# Patient Record
Sex: Female | Born: 1935
Health system: Southern US, Community
[De-identification: ages and names within clinical notes are randomized; demographics above are authoritative.]

## PROBLEM LIST (undated history)

## (undated) DIAGNOSIS — K900 Celiac disease: Secondary | ICD-10-CM

## (undated) DIAGNOSIS — M1990 Unspecified osteoarthritis, unspecified site: Secondary | ICD-10-CM

## (undated) DIAGNOSIS — I639 Cerebral infarction, unspecified: Secondary | ICD-10-CM

## (undated) DIAGNOSIS — M810 Age-related osteoporosis without current pathological fracture: Secondary | ICD-10-CM

## (undated) DIAGNOSIS — E785 Hyperlipidemia, unspecified: Secondary | ICD-10-CM

## (undated) DIAGNOSIS — M549 Dorsalgia, unspecified: Secondary | ICD-10-CM

## (undated) DIAGNOSIS — E7800 Pure hypercholesterolemia, unspecified: Secondary | ICD-10-CM

## (undated) DIAGNOSIS — F0390 Unspecified dementia without behavioral disturbance: Secondary | ICD-10-CM

## (undated) DIAGNOSIS — E669 Obesity, unspecified: Secondary | ICD-10-CM

## (undated) DIAGNOSIS — K922 Gastrointestinal hemorrhage, unspecified: Secondary | ICD-10-CM

## (undated) DIAGNOSIS — J449 Chronic obstructive pulmonary disease, unspecified: Secondary | ICD-10-CM

## (undated) HISTORY — PX: CHOLECYSTECTOMY: SHX55

## (undated) HISTORY — PX: BACK SURGERY: SHX140

---

## 1999-05-28 ENCOUNTER — Other Ambulatory Visit: Admission: RE | Admit: 1999-05-28 | Discharge: 1999-05-28 | Payer: Self-pay | Admitting: Obstetrics and Gynecology

## 1999-09-05 ENCOUNTER — Emergency Department (HOSPITAL_COMMUNITY): Admission: EM | Admit: 1999-09-05 | Discharge: 1999-09-05 | Payer: Self-pay | Admitting: Emergency Medicine

## 2000-07-07 ENCOUNTER — Other Ambulatory Visit: Admission: RE | Admit: 2000-07-07 | Discharge: 2000-07-07 | Payer: Self-pay | Admitting: Obstetrics and Gynecology

## 2003-11-11 ENCOUNTER — Inpatient Hospital Stay (HOSPITAL_COMMUNITY): Admission: RE | Admit: 2003-11-11 | Discharge: 2003-11-14 | Payer: Self-pay | Admitting: Neurosurgery

## 2003-12-18 ENCOUNTER — Encounter: Admission: RE | Admit: 2003-12-18 | Discharge: 2003-12-18 | Payer: Self-pay | Admitting: Neurosurgery

## 2004-03-04 ENCOUNTER — Encounter: Admission: RE | Admit: 2004-03-04 | Discharge: 2004-03-04 | Payer: Self-pay | Admitting: Neurosurgery

## 2005-06-04 ENCOUNTER — Encounter: Admission: RE | Admit: 2005-06-04 | Discharge: 2005-06-04 | Payer: Self-pay | Admitting: Neurosurgery

## 2006-12-21 ENCOUNTER — Other Ambulatory Visit: Admission: RE | Admit: 2006-12-21 | Discharge: 2006-12-21 | Payer: Self-pay | Admitting: Obstetrics and Gynecology

## 2007-08-17 ENCOUNTER — Ambulatory Visit: Payer: Self-pay | Admitting: Gastroenterology

## 2007-08-17 LAB — CONVERTED CEMR LAB
Basophils Absolute: 0.1 10*3/uL (ref 0.0–0.1)
Basophils Relative: 1.3 % — ABNORMAL HIGH (ref 0.0–1.0)
Eosinophils Absolute: 0.1 10*3/uL (ref 0.0–0.6)
Eosinophils Relative: 1.3 % (ref 0.0–5.0)
Ferritin: 9 ng/mL — ABNORMAL LOW (ref 10.0–291.0)
Folate: 15.7 ng/mL
HCT: 36.5 % (ref 36.0–46.0)
Hemoglobin: 12.2 g/dL (ref 12.0–15.0)
Iron: 53 ug/dL (ref 42–145)
Lymphocytes Relative: 22.3 % (ref 12.0–46.0)
MCHC: 33.4 g/dL (ref 30.0–36.0)
MCV: 91.6 fL (ref 78.0–100.0)
Monocytes Absolute: 0.8 10*3/uL — ABNORMAL HIGH (ref 0.2–0.7)
Monocytes Relative: 8.9 % (ref 3.0–11.0)
Neutro Abs: 6.1 10*3/uL (ref 1.4–7.7)
Neutrophils Relative %: 66.2 % (ref 43.0–77.0)
Platelets: 282 10*3/uL (ref 150–400)
RBC: 3.99 M/uL (ref 3.87–5.11)
RDW: 13.8 % (ref 11.5–14.6)
Saturation Ratios: 11.4 % — ABNORMAL LOW (ref 20.0–50.0)
Tissue Transglutaminase Ab, IgA: 91 units — ABNORMAL HIGH (ref <7)
Transferrin: 331.7 mg/dL (ref >=212.0)
Vitamin B-12: 297 pg/mL (ref 211–911)
WBC: 9.1 10*3/uL (ref 4.5–10.5)

## 2007-08-22 ENCOUNTER — Ambulatory Visit: Payer: Self-pay | Admitting: Gastroenterology

## 2007-09-06 ENCOUNTER — Encounter: Payer: Self-pay | Admitting: Gastroenterology

## 2007-09-06 ENCOUNTER — Ambulatory Visit: Payer: Self-pay | Admitting: Gastroenterology

## 2007-09-06 DIAGNOSIS — K2960 Other gastritis without bleeding: Secondary | ICD-10-CM | POA: Insufficient documentation

## 2007-09-06 DIAGNOSIS — K5730 Diverticulosis of large intestine without perforation or abscess without bleeding: Secondary | ICD-10-CM | POA: Insufficient documentation

## 2007-09-06 DIAGNOSIS — K559 Vascular disorder of intestine, unspecified: Secondary | ICD-10-CM | POA: Insufficient documentation

## 2007-09-26 ENCOUNTER — Encounter: Payer: Self-pay | Admitting: Gastroenterology

## 2007-09-26 ENCOUNTER — Encounter: Admission: RE | Admit: 2007-09-26 | Discharge: 2007-09-26 | Payer: Self-pay | Admitting: Gastroenterology

## 2007-10-06 ENCOUNTER — Ambulatory Visit: Payer: Self-pay | Admitting: Gastroenterology

## 2007-11-06 ENCOUNTER — Telehealth: Payer: Self-pay | Admitting: Gastroenterology

## 2007-11-06 ENCOUNTER — Ambulatory Visit: Payer: Self-pay | Admitting: Gastroenterology

## 2007-11-13 ENCOUNTER — Telehealth: Payer: Self-pay | Admitting: Gastroenterology

## 2007-11-14 LAB — CONVERTED CEMR LAB: Tissue Transglutaminase Ab, IgA: 55 units — ABNORMAL HIGH (ref <7)

## 2008-06-24 DIAGNOSIS — M545 Low back pain, unspecified: Secondary | ICD-10-CM | POA: Insufficient documentation

## 2008-06-24 DIAGNOSIS — M810 Age-related osteoporosis without current pathological fracture: Secondary | ICD-10-CM | POA: Insufficient documentation

## 2008-06-24 DIAGNOSIS — K644 Residual hemorrhoidal skin tags: Secondary | ICD-10-CM | POA: Insufficient documentation

## 2008-06-24 DIAGNOSIS — E7800 Pure hypercholesterolemia, unspecified: Secondary | ICD-10-CM | POA: Insufficient documentation

## 2008-06-25 ENCOUNTER — Ambulatory Visit: Payer: Self-pay | Admitting: Gastroenterology

## 2008-06-25 DIAGNOSIS — K219 Gastro-esophageal reflux disease without esophagitis: Secondary | ICD-10-CM | POA: Insufficient documentation

## 2008-06-25 DIAGNOSIS — K900 Celiac disease: Secondary | ICD-10-CM | POA: Insufficient documentation

## 2008-06-27 ENCOUNTER — Telehealth: Payer: Self-pay | Admitting: Gastroenterology

## 2008-06-27 LAB — CONVERTED CEMR LAB
Folate: >20 ng/mL
Iron: 102 ug/dL (ref 42–145)
Rheumatoid fact SerPl-aCnc: 63.2 intl units/mL — ABNORMAL HIGH (ref 0.0–20.0)
Saturation Ratios: 26.3 % (ref 20.0–50.0)
Transferrin: 276.9 mg/dL (ref >=212.0)
Vitamin B-12: 355 pg/mL (ref 211–911)

## 2008-06-28 ENCOUNTER — Telehealth: Payer: Self-pay | Admitting: Gastroenterology

## 2008-07-02 ENCOUNTER — Encounter: Payer: Self-pay | Admitting: Gastroenterology

## 2008-07-19 ENCOUNTER — Encounter: Payer: Self-pay | Admitting: Gastroenterology

## 2008-07-22 ENCOUNTER — Telehealth: Payer: Self-pay | Admitting: Gastroenterology

## 2008-10-02 ENCOUNTER — Encounter: Payer: Self-pay | Admitting: Gastroenterology

## 2008-10-30 ENCOUNTER — Encounter: Admission: RE | Admit: 2008-10-30 | Discharge: 2008-10-30 | Payer: Self-pay | Admitting: Family Medicine

## 2010-04-08 ENCOUNTER — Emergency Department (HOSPITAL_BASED_OUTPATIENT_CLINIC_OR_DEPARTMENT_OTHER): Admission: EM | Admit: 2010-04-08 | Discharge: 2010-04-09 | Payer: Self-pay | Admitting: Emergency Medicine

## 2010-04-08 ENCOUNTER — Ambulatory Visit: Payer: Self-pay | Admitting: Interventional Radiology

## 2010-07-02 NOTE — Miscellaneous (Signed)
Summary: Orders Update  Clinical Lists Changes  Orders: Added new Referral order of Rheumatology Referral (Rheumatology) - Signed 

## 2010-07-02 NOTE — Progress Notes (Signed)
Summary: LAB RESULTS  Medications Added LEVSIN 0.125 MG  TABS (HYOSCYAMINE SULFATE) one by mouth 4-6 hours as needed       Phone Note Call from Patient Call back at [phone]   Call For: DR PATTERSON Reason for Call: Lab or Test Results Summary of Call: REALLY WANTS TO KNOW LAB RESULTS FOR CELIAC. Initial call taken by: Leanor Kail [facility],  November 13, 2007 10:28 AM  Follow-up for Phone Call        pt has alot of questions about Celiac and says that she has a "twing of pain" not sure what from and says she has changed her diet and wants to know if the "twing" is from the Celiac. And if there is something she can take for it. And do her labs change anything? Follow-up by: Harlow Mares CMA,  November 13, 2007 10:58 AM  Additional Follow-up for Phone Call Additional follow up Details #1::        Avoid NSAID's and use aciphex and use as needed levsin. Additional Follow-up by: Mardella Layman MD [facility],  November 14, 2007 9:02 AM    Additional Follow-up for Phone Call Additional follow up Details #2::    pt aware and levsin sent. Follow-up by: Harlow Mares CMA,  November 14, 2007 9:13 AM  New/Updated Medications: LEVSIN 0.125 MG  TABS (HYOSCYAMINE SULFATE) one by mouth 4-6 hours as needed   Prescriptions: LEVSIN 0.125 MG  TABS (HYOSCYAMINE SULFATE) one by mouth 4-6 hours as needed  #180 x 3   Entered by:   Harlow Mares CMA   Authorized by:   Mardella Layman MD FACG,FAGA   Signed by:   Harlow Mares CMA on 11/14/2007   Method used:   Electronically sent to ...       [facility] [address]       [address]       [place], [place]  [address]       Ph: [phone]       Fax: [phone]   RxID:   [id number]

## 2010-07-02 NOTE — Letter (Signed)
Summary: Rheumatology-Dr. Kellie Simmering  Rheumatology-Dr. Kellie Simmering   Imported By: Maryln Gottron 10/18/2008 15:37:20  _____________________________________________________________________  External Attachment:    Type:   Image     Comment:   External Document

## 2010-07-02 NOTE — Progress Notes (Signed)
Summary: Follow up on labs   Phone Note Outgoing Call   Call placed by: Harlow Mares CMA,  June 27, 2008 10:05 AM Summary of Call: patient appt with Dr Kellie Simmering for arthritis. Left message on patients machine to call back.  Initial call taken by: Harlow Mares CMA,  June 27, 2008 10:06 AM  Follow-up for Phone Call        patient has alot of appt going on and is aware her labs are abnormal i gave her all of dr truslows information and she will contact him for an appt and let me know where to send her records to. Follow-up by: Harlow Mares CMA,  June 27, 2008 11:41 AM

## 2010-07-02 NOTE — Assessment & Plan Note (Signed)
Summary: F-UP CELIAC DIS/ULCERS/FH  Medications Added CALCIUM 600 1500 MG TABS (CALCIUM CARBONATE) two times a day HYDROCODONE-ACETAMINOPHEN 5-500 MG TABS (HYDROCODONE-ACETAMINOPHEN) as needed FENTANYL 75 MCG/HR PT72 (FENTANYL) as needed FISH OIL 1200 MG CAPS (OMEGA-3 FATTY ACIDS) once daily        History of Present Illness Visit Type: follow up Primary GI MD: Melvia Heaps MD [facility] Primary Provider: Windle Guard, MD Chief Complaint: celiac disease History of Present Illness:   Carrie Smith is a 75 year old Caucasian female with chronic arthritis of her back and hands that was previously treated with NSAIDs that caused severe colitis and GI bleeding and had to be discontinued. She currently is on hydrocodone twice a day and is managed by Dr. Jeannetta Nap. She also has chronic GERD managed with AcipHex 20 mg a day.  She has celiac disease this is remission on dietary therapy and duodenal biopsy year ago she had no active because of disease. She is due for bone density scanning. She is on multivitamins and vitamin D replacement. Recent colonoscopy was otherwise unremarkable except for diverticulosis, external hemorrhoids, and changes of chronic ischemic colitis felt secondary to NSAIDs. Her bowels are moving well at this time despite codeine and she denies melena or hematochezia.Her appetite is good and her weight is stable. Prior serum B12 level was borderline low and we will repeat.   GI Review of Systems      Denies abdominal pain, acid reflux, belching, bloating, chest pain, dysphagia with liquids, dysphagia with solids, heartburn, loss of appetite, nausea, vomiting, vomiting blood, weight loss, and  weight gain.        Denies anal fissure, black tarry stools, change in bowel habit, constipation, diarrhea, diverticulosis, fecal incontinence, heme positive stool, hemorrhoids, irritable bowel syndrome, jaundice, light color stool, liver problems, rectal bleeding, and  rectal pain.     Prior  Medications Reviewed Using: List Brought by Patient  Updated Prior Medication List: LEVSIN 0.125 MG  TABS (HYOSCYAMINE SULFATE) one by mouth 4-6 hours as needed VITAMIN D 30160 UNIT CAPS (ERGOCALCIFEROL) 1 tablet by mouth q week PRAVASTATIN SODIUM 40 MG TABS (PRAVASTATIN SODIUM) 1 tablet by mouth at bedtime AMITRIPTYLINE HCL 10 MG TABS (AMITRIPTYLINE HCL) 1 tablet by mouth at bedtime DIAZEPAM 5 MG TABS (DIAZEPAM) 1 tablet by mouth at bedtime ACIPHEX 20 MG TBEC (RABEPRAZOLE SODIUM) 1 tablet by mouth once daily CALCIUM 600 1500 MG TABS (CALCIUM CARBONATE) two times a day HYDROCODONE-ACETAMINOPHEN 5-500 MG TABS (HYDROCODONE-ACETAMINOPHEN) as needed FENTANYL 75 MCG/HR PT72 (FENTANYL) as needed FISH OIL 1200 MG CAPS (OMEGA-3 FATTY ACIDS) once daily  Current Allergies (reviewed today): ! CODEINE  Past Medical History:    Reviewed history from 06/24/2008 and no changes required:       Current Problems:        OSTEOPOROSIS (ICD-733.00)       HYPERCHOLESTEROLEMIA (ICD-272.0)       LOW BACK PAIN, CHRONIC (ICD-724.2)       OTHER SPEC GASTRITIS WITHOUT MENTION HEMORRHAGE (ICD-535.40)       EXTERNAL HEMORRHOIDS (ICD-455.3)       DIVERTICULOSIS, COLON (ICD-562.10)       ISCHEMIC COLITIS (ICD-557.9)         Past Surgical History:    Reviewed history from 06/24/2008 and no changes required:       Cholecystectomy       Tubal Ligation   Family History:    Reviewed history and no changes required:       No FH of Colon Cancer:  Family History of Heart Disease: Both parents & siblings       Family History of Diabetes: Mother, Sister  Social History:    Reviewed history and no changes required:       Occupation: Retired       Patient is a former smoker.        Alcohol Use - no       Daily Caffeine Use -1       Illicit Drug Use - no       Patient does not get regular exercise.    Risk Factors:  Tobacco use:  quit    Year quit:  2000 Drug use:  no Caffeine use:  1 drinks per  day Alcohol use:  no Exercise:  no Seatbelt use:  100 %    Vital Signs:  Patient Profile:   75 Years Old Female Height:     59 inches Weight:      182.50 pounds BMI:     36.99 Pulse rate:   72 / minute Pulse rhythm:   regular BP sitting:   114 / 70  (left arm)  Vitals Entered By: June McMurray CMA (June 25, 2008 11:02 AM)                  Physical Exam  General:     Well developed, well nourished, no acute distress.healthy appearing.   Head:     Normocephalic and atraumatic. Eyes:     PERRLA, no icterus.exam deferred to patient's ophthalmologist.   Psych:     Alert and cooperative. Normal mood and affect.    Impression & Recommendations:  Problem # 1:  CELIAC DISEASE (ICD-579.0) Assessment: Improved Continue gluten-free diet and vitamin supplementation. Orders: TLB-IBC Pnl (Iron/FE;Transferrin) (83550-IBC) TLB-B12, Serum-Total ONLY (60454-U98) TLB-Folic Acid (Folate) (82746-FOL)   Problem # 2:  OSTEOPOROSIS (ICD-733.00) Assessment: Improved Continue Meds per Dr. Jeannetta Nap and I will order a rheumatoid factor with her anemia profile. Orders: TLB-Rheumatoid Factor (RA) (11914-NW)   Problem # 3:  ISCHEMIC COLITIS (ICD-557.9) Assessment: Improved this was felt to be related to NSAID use and has resolved with discontinuation of these meds  Problem # 4:  EXTERNAL HEMORRHOIDS (ICD-455.3) Assessment: Improved  Problem # 5:  GERD (ICD-530.81) Assessment: Improved continue reflux being a daily PPI therapy.   Patient Instructions: 1)  Copy Sent To:Dr. Windle Guard 2)  Please Continue current medications. 3)  Continue Celiac Diet 4)  Anemia profile and rheumatoid factor ordered

## 2010-07-02 NOTE — Miscellaneous (Signed)
Summary: RX DARVOCET  Clinical Lists Changes  Medications: Added new medication of DARVOCET-N 100 100-650 MG  TABS (PROPOXYPHENE N-APAP) every six hours as needed - Signed Rx of DARVOCET-N 100 100-650 MG  TABS (PROPOXYPHENE N-APAP) every six hours as needed;  #120 x 0;  Signed;  Entered by: Harlow Mares CMA;  Authorized by: Mardella Layman MD;  Method used: Print then Give to Patient    Prescriptions: DARVOCET-N 100 100-650 MG  TABS (PROPOXYPHENE N-APAP) every six hours as needed  #120 x 0   Entered by:   Harlow Mares CMA   Authorized by:   Mardella Layman MD   Signed by:   Harlow Mares CMA on 10/06/2007   Method used:   Print then Give to Patient   RxID:   [id number]

## 2010-07-02 NOTE — Procedures (Signed)
Summary: Colonoscopy   Colonoscopy  Procedure date:  09/06/2007  Findings:      Results: Hemorrhoids.     Results: Diverticulosis.       Results: Colitis.       Location:  [facility].   Patient Name: Carrie Smith, Carrie Smith MRN:  Procedure Procedures: Panendoscopy (EGD) CPT: 43235.    with biopsy(s)/brushing(s). CPT: D1846139.  Personnel: Endoscopist: Vania Rea. Jarold Motto, MD.  Exam Location: Exam performed in Outpatient Clinic. Outpatient  Patient Consent: Procedure, Alternatives, Risks and Benefits discussed, consent obtained, from patient. Consent was obtained by the RN.  Indications  Evaluation of: Positive fecal occult blood test  Comments: ++ celiac serologies.Marland KitchenMarland KitchenR/O SPRUE.... History  Current Medications: Patient is taking a non-steroidal medication. Patient is not currently taking Coumadin.  Medical/Surgical History: Hyperparathyroidism, Osteoarthritis, Back pain, Narcotic use. Osteoporesis, TIAs, Celiac disease, ++ serology.  Pre-Exam Physical: Performed Sep 06, 2007  Entire physical exam was normal.  Comments: Pt. history reviewed/updated, physical exam performed prior to initiation of sedation? yes Exam Exam Info: Maximum depth of insertion Duodenum, intended Duodenum. Patient position: on left side. Duration of exam: 10 minutes. Vocal cords visualized. Gastric retroflexion performed. Images taken. ASA Classification: II. Tolerance: excellent.  Sedation Meds: Patient assessed and found to be appropriate for moderate (conscious) sedation. Sedation was managed by the Endoscopist. Fentanyl 25 mcg. given IV. Versed 2 mg. given IV.  Monitoring: BP and pulse monitoring done. Oximetry used. Supplemental O2 given at 2 Liters.  Instrument(s): GIF 160. Serial [id number].   Findings - Normal: Proximal Esophagus to Distal Esophagus. Not Seen: Tumor. Barrett's esophagus. Mucosal abnormality. Foreign body. Stricture. Varices.  - MUCOSAL ABNORMALITY:  Cardia to Antrum. Erosions present. Nodularity present. Erythematous mucosa. Red spots present. RUT done, results pending. ICD9: Gastritis, NSAID Induced: 535.40. Comment: Large amount of dried HEME noted...  - Normal: Duodenal Bulb to Duodenal 2nd Portion. Not Seen: Ulcer. Mucosal abnormality. Biopsy/Normal taken. Comments: SI biopsy done... ..   Assessment  Diagnoses: 535.40: Gastritis, NSAID Induced.   Comments: Small bowel biopsy done to confirm celiac disease Dx.... Events  Unplanned Intervention: No unplanned interventions were required.  Plans Instructions: No aspirin or non-steroidal containing medications: STOP..STOP..STOP.  Medication(s): Await pathology. Referring provider to order medications. PPI: Aciphex 20 mg QAM, starting Sep 06, 2007 for 4 wks.   Patient Education: Patient given standard instructions for: Mucosal Abnormality.  Comments: STOP ALL NSAIDS... Disposition: After procedure patient sent to recovery. After recovery patient sent home.  Scheduling: Clinic Visit, to Vania Rea. Jarold Motto, MD, around Oct 06, 2007.    This report was created from the original endoscopy report, which was reviewed and signed by the above listed endoscopist.    cc.Wilson O. Jeannetta Nap, MD    Shawn Route    Stefanie Libel, MD

## 2010-07-02 NOTE — Letter (Signed)
[facility]: [facility]   Imported By: Maryln Gottron 10/12/2007 14:58:51  _____________________________________________________________________  External Attachment:    Type:   Image     Comment:   External Document

## 2010-07-02 NOTE — Progress Notes (Signed)
Summary: Lab results   Phone Note Call from Patient Call back at Home Phone [phone] Call back at cell [phone]   Call For: DR Jarold Motto Reason for Call: Talk to Nurse Summary of Call: Talked to you yesterday about her lab results but she didnt write it down and forgot. Initial call taken by: Leanor Kail [facility],  June 28, 2008 11:12 AM  Follow-up for Phone Call        gave the patient the lab values again today. Follow-up by: Harlow Mares CMA,  June 28, 2008 11:40 AM

## 2010-07-02 NOTE — Progress Notes (Signed)
Summary: ?LAB WORK & WANTS SAMPLES   Phone Note Call from Patient Call back at [phone]   Caller: Patient Call For: PATTERSON Reason for Call: Talk to Nurse Summary of Call: PATTERSON PT.  PT TOLD TO COME IN FOR BLOOD WORK ANYTIME THIS WEEK, ARE ORDERS IN? ALSO WANTS SAMPLES OF ACIPHEX.  PATIENT'S CHART HAS BEEN REQUESTED Initial call taken by: Magdalen Spatz [facility],  November 06, 2007 9:07 AM  Follow-up for Phone Call        sprue panel in IDX, samples of aciphex up front. pt aware. Follow-up by: Harlow Mares CMA,  November 06, 2007 10:28 AM

## 2010-07-02 NOTE — Letter (Signed)
Summary: Knees/Dr. Aundra Dubin M.D.  Knees/Dr. Aundra Dubin M.D.   Imported By: Sherian Rein 08/07/2008 14:52:21  _____________________________________________________________________  External Attachment:    Type:   Image     Comment:   External Document

## 2010-07-02 NOTE — Procedures (Signed)
Summary: EGD   EGD  Procedure date:  09/06/2007  Findings:      Findings: Gastritis  Location: [facility]   Patient Name: Carrie Smith, Carrie Smith MRN:  Procedure Procedures: Panendoscopy (EGD) CPT: 43235.    with biopsy(s)/brushing(s). CPT: D1846139.  Personnel: Endoscopist: Vania Rea. Jarold Motto, MD.  Exam Location: Exam performed in Outpatient Clinic. Outpatient  Patient Consent: Procedure, Alternatives, Risks and Benefits discussed, consent obtained, from patient. Consent was obtained by the RN.  Indications  Evaluation of: Positive fecal occult blood test  Comments: ++ celiac serologies.Marland KitchenMarland KitchenR/O SPRUE.... History  Current Medications: Patient is taking a non-steroidal medication. Patient is not currently taking Coumadin.  Medical/Surgical History: Hyperparathyroidism, Osteoarthritis, Back pain, Narcotic use. Osteoporesis, TIAs, Celiac disease, ++ serology.  Pre-Exam Physical: Performed Sep 06, 2007  Entire physical exam was normal.  Comments: Pt. history reviewed/updated, physical exam performed prior to initiation of sedation? yes Exam Exam Info: Maximum depth of insertion Duodenum, intended Duodenum. Patient position: on left side. Duration of exam: 10 minutes. Vocal cords visualized. Gastric retroflexion performed. Images taken. ASA Classification: II. Tolerance: excellent.  Sedation Meds: Patient assessed and found to be appropriate for moderate (conscious) sedation. Sedation was managed by the Endoscopist. Fentanyl 25 mcg. given IV. Versed 2 mg. given IV.  Monitoring: BP and pulse monitoring done. Oximetry used. Supplemental O2 given at 2 Liters.  Instrument(s): GIF 160. Serial [id number].   Findings - Normal: Proximal Esophagus to Distal Esophagus. Not Seen: Tumor. Barrett's esophagus. Mucosal abnormality. Foreign body. Stricture. Varices.  - MUCOSAL ABNORMALITY: Cardia to Antrum. Erosions present. Nodularity present. Erythematous mucosa. Red  spots present. RUT done, results pending. ICD9: Gastritis, NSAID Induced: 535.40. Comment: Large amount of dried HEME noted...  - Normal: Duodenal Bulb to Duodenal 2nd Portion. Not Seen: Ulcer. Mucosal abnormality. Biopsy/Normal taken. Comments: SI biopsy done... ..   Assessment  Diagnoses: 535.40: Gastritis, NSAID Induced.   Comments: Small bowel biopsy done to confirm celiac disease Dx.... Events  Unplanned Intervention: No unplanned interventions were required.  Plans Instructions: No aspirin or non-steroidal containing medications: STOP..STOP..STOP.  Medication(s): Await pathology. Referring provider to order medications. PPI: Aciphex 20 mg QAM, starting Sep 06, 2007 for 4 wks.   Patient Education: Patient given standard instructions for: Mucosal Abnormality.  Comments: STOP ALL NSAIDS... Disposition: After procedure patient sent to recovery. After recovery patient sent home.  Scheduling: Clinic Visit, to Vania Rea. Jarold Motto, MD, around Oct 06, 2007.    This report was created from the original endoscopy report, which was reviewed and signed by the above listed endoscopist.

## 2010-08-26 ENCOUNTER — Other Ambulatory Visit: Payer: Self-pay | Admitting: Family Medicine

## 2010-08-26 ENCOUNTER — Ambulatory Visit
Admission: RE | Admit: 2010-08-26 | Discharge: 2010-08-26 | Disposition: A | Discharge status: Home / Self Care | Payer: Medicare Other | Source: Ambulatory Visit | Attending: Family Medicine | Admitting: Family Medicine

## 2010-08-26 DIAGNOSIS — R52 Pain, unspecified: Secondary | ICD-10-CM

## 2010-08-26 DIAGNOSIS — R609 Edema, unspecified: Secondary | ICD-10-CM

## 2010-10-13 NOTE — Assessment & Plan Note (Signed)
[facility]                         GASTROENTEROLOGY OFFICE NOTE   Carrie Smith, Carrie Smith                       MRN:          [id number]  DATE:10/06/2007                            DOB:          1935-07-26    [name] had endoscopy and colonoscopy and was found to have severe  NSAID damage to her liver and colon.  She had markedly positive  serologies for celiac disease but had a negative small bowel biopsy.  I  still think she has celiac disease and we referred her to the dietician  for gluten free diet.  She will come back in 2 months' time and have her  serologies repeated.   In the interim I have asked her to avoid NSAIDs and to use p.r.n.  Darvocet 100 for severe arthritis.  We renewed her Aciphex 20 mg a day  and I have asked her to continue other medications per Dr. Jeannetta Nap.     Vania Rea. Jarold Motto, MD, Caleen Essex, FAGA  Electronically Signed    DRP/MedQ  DD: 10/06/2007  DT: 10/06/2007  Job #: [id number]   cc:   Windle Guard, M.D.

## 2010-10-13 NOTE — Assessment & Plan Note (Signed)
[facility]                         GASTROENTEROLOGY OFFICE NOTE   JAMONICA, SCHOFF                       MRN:          [id number]  DATE:08/17/2007                            DOB:          10-Oct-1935    HISTORY:  Carrie Smith is a 75 year old white female who had positive  Hemoccult cards at the time of a recent check up with Dr. Windle Guard.  I did colonoscopy on her some 10 years ago and removed a hyperplastic  polyp.   Dekayla really denies any GI complaints except for some intermittent  dyspepsia.  She is on NSAID's and aspirin for chronic low back pain and  multiple back surgeries.  She has been under the care of Dr. Julio Sicks  and apparently currently is taking p.r.n. hydrocodone and Fentanyl patch  every 3 days along with p.r.n. Valium for muscle relaxation.  She denies  anemia or any history of coagulopathy.  Her bowels move well.  She has  really had no melena or hematochezia but apparently she did have a  positive Hemoccult card on recent testing.  Otherwise, she denies  gastrointestinal problems except for some mild constipation related to  her Codeine use. She has not had any barium studies or endoscopic exams  of her gut since 1998.  At that time she did have some diverticulosis.  She does have a past history of NSAID-induced peptic ulcer disease.  She  does take ranitidine at bedtime currently with her aspirin and NSAID's.   PAST MEDICAL HISTORY:  Her past medical history otherwise is remarkable  for hypercholesterolemia, mild CVA in 1987, degenerative arthritis and  rather severe spondylosis of her back requiring multiple surgical  procedures.  She also has had previous bilateral tubal ligation and a  cholecystectomy some 30 years ago.   MEDICATIONS:  1. Arthrotec 75 mg twice a day.  2. Vitamin D weekly.  3. Calcium with vitamin D twice a day.  4. Pravastatin 40 mg daily.  5. Ranitidine 300 mg daily.  6. Aspirin 81 mg daily.  7. Triamterene/HCTZ daily.  8. Amitriptyline 10 mg at bedtime.  9. P.r.n. Valium 5 mg.  10.50 mcg Fentanyl patch every 3 to 4 days.  11.P.r.n. hydrocodone/APAP use.   ALLERGIES:  She denies true drug allergies.   FAMILY HISTORY:  Noncontributory except for atherosclerotic  cardiovascular disease in multiple members.   SOCIAL HISTORY:  She is divorced and lives alone.  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  She does not smoke currently  and quit 5 to 6 years ago.  She denies alcohol abuse.   REVIEW OF SYSTEMS:  Remarkable for arthritis in her large joints and  back.  She has some mild chronic urine incontinency.  She denies current  cardiovascular, pulmonary or other neurological or psychiatric problems.  Her review of systems is otherwise noncontributory.   PHYSICAL EXAMINATION:  GENERAL:  She is a healthy, but elderly-appearing  white female appearing her stated age in no acute distress.  VITAL SIGNS:  She is only 4 feet tall and weighs 181  pounds.  Blood  pressure 116/68, pulse was 84 and regular.  HEENT:  I could not appreciate stigmata of chronic liver disease.  CHEST:  Her chest was generally clear.  CARDIOVASCULAR:  She appeared to be in a regular rhythm without murmurs,  gallops or rubs.  ABDOMEN:  Showed no organomegaly, masses or tenderness.  Bowel sounds  were normal.  BACK:  Examination of the back was unremarkable with lower back vertical  scar.  There is no evidence of induration or increased warmth or  localized tenderness.  EXTREMITIES:  Peripheral extremities were unremarkable.  NEUROLOGICAL:  Mental status was clear.   ASSESSMENT:  1. Guaiac positive stool, possibly related to heavy nonsteroidal anti-      inflammatory and aspirin use and a previous history of peptic ulcer      disease - rule out Helicobacter Pylori infection.  2. History of asymptomatic diverticulosis.  3. Colonoscopy needed to exclude colonic polyposis.  4.  Chronic low back pain, as mentioned above, on multiple medications.  5. Previous history of mild transient ischemic attack some 20 years      ago.  6. History of hypercholesterolemia.  7. History of osteoporosis.  8. Current narcotic dependency with p.r.n. Valium use.   RECOMMENDATIONS:  1. Check CBC, anemia profile and Celiac panel.  2. Outpatient endoscopy and colonoscopy while holding aspirin for one      week.  3. At the time of her endoscopy will check for H. Pylori and do small      bowel biopsy.  4. Continue other multiple medications per Dr. Jeannetta Nap.     Vania Rea. Jarold Motto, MD, Caleen Essex, FAGA  Electronically Signed    DRP/MedQ  DD: 08/17/2007  DT: 08/17/2007  Job #: [id number]   cc:   Windle Guard, M.D.  Henry A. Pool, M.D.  Cynthia P. Romine, M.D.

## 2010-10-16 NOTE — Op Note (Signed)
NAME:  Carrie Smith, Carrie Smith                          ACCOUNT NO.:  [id number]   MEDICAL RECORD NO.:  [id number]                   PATIENT TYPE:  INP   LOCATION:  2899                                 FACILITY:  [facility]   PHYSICIAN:  Sherilyn Cooter A. Pool, M.D.                 DATE OF BIRTH:  1936-04-23   DATE OF PROCEDURE:  11/11/2003  DATE OF DISCHARGE:                                 OPERATIVE REPORT   PREOPERATIVE DIAGNOSES:  1. L4-5 degenerative grade I spondylolisthesis with severe stenosis.  2. Right L5-S1 herniated nucleus pulposus with radiculopathy.   PROCEDURE:  1. L4-5 decompression laminectomy, with foraminotomies.  2. L4-5 posterior lumbar body fusion, utilizing tangent wedges and local     autografting.  3. L4-5 posterolateral fusion using pedicle screw fixation and local     autografting.  4. Right L5-S1 laminotomy and microdiskectomy.   SURGEON:  Kathaleen Maser. Pool, M.D.   ASSISTANT:  Donalee Citrin, M.D.   ANESTHESIA:  General endotracheal.   INDICATIONS:  Ms. Ancona is a 75 year old female with a history of severe  right lower extremity pain, consistent with right-sided S1 radiculopathy.  She also has a history of long-standing back pain and bilateral lower  extremity symptoms, consistent with neurogenic claudication.  Workup  demonstrated severe stenosis at L4-5, secondary to degenerative  spondylolisthesis.  She has coincidental right-sided L5-S1 disk herniation,  with marked compression of the thecal sac and right-sided S1 nerve root.  We  discussed the options over management.  I think she has been chronically  symptomatic from her stenosis, but here acutely is decompensated secondary  to this ruptured disk at L5-S1.  We have discussed undergoing surgery at  both levels.  The patient is aware of the risks and benefits and wishes to  proceed.   OPERATIVE NOTE:  The patient went to the operating room and placed on the  table in a supine position.  After an adequate level of  anesthesia was  achieved, the patient was moved prone onto a Wilson frame and appropriately  padded in all bony regions.  Prepped and draped sterilely.  A #10 blade was  used to make a longitudinal skin incision overlying the L4-5 and S1 levels.  This was carried down sharply in the midline.  Subperiosteal dissection was  performed bilaterally, exposing the lamina and facet joints of L4-L5 and S1.  Deep self-retaining retractor was placed.  Intraoperative fluoroscopy was  used and the levels were confirmed.  Transverse processes at L5 and L4 were  also dissected free bilaterally.  Complete decompression and laminectomy was  then performed at L4-5 using Leksell rongeurs, Kerrison rongeurs and the  high-speed drill to remove the entire lamina at L4; the entire inferior  facet joint complex at L4; and the superior facet of L5.  The superior  aspect of the lamina at L5 was also resected.  Ligament of Flavum was  elevated and dissected in piecemeal fashion using Kerrison rongeurs.  Underlying thecal sac and exiting L4 and L5 nerve roots were identified.  Wide foraminotomies were performed along the course of the exiting nerve  roots.   Attention was placed to the L5-S1 on the right.  Laminotomy was then  performed using the high-speed drill and Kerrison rongeurs to remove the  inferior aspect of the lamina at L5 and the medial aspect of the L5-S1 facet  joints, and superior rim of the S1 lamina.  The ligament of Flavum was then  elevated and resected in piecemeal fashion, using Kerrison rongeurs.  Underlying thecal sac and exiting S1 nerve root were identified.  Microdissection of the right-sided S1 nerve root and underlying disk  herniation.  Thecal sac and S1 nerve root were mobilized towards the  midline.  A large free fragment disk herniation was encountered.  This was  dissected free using blunt nerve hooks and pituitary rongeurs.  All elements  of the disk herniation were completely  resected.   At this point a very thorough fragmentectomy had been performed.  There was  no evidence of any significant tearing of the annulus itself.  The disk  itself was quite flat.  It was decided not to do an annulotomy at this  level.   Attention was then placed to the L4-5.  Starting first at the patient's  side, the thecal sac and nerve roots were protected.  Disk space was then  incised with #15 blade.  Wide disk space cleaned of tissue using pituitary  rongeurs, upward angled-tooth rongeurs and Epstein curettes.  All loose  __________ were removed the interspace.  The procedure was then repeated on  the contralateral side.  The disk space was then sequentially dilated up to  10 mm with a 10 mm distraction and patient flipped to the right side.  Thecal sac and nerve root was protected on the left.  Disk space was then  weaned and then cut with 10 mm tangent instruments.  Soft tissue was removed  from the interspace.  The 10 x 26  mm tangent wedge was then impacted into  place and recessed approximately 2 mm from posterior cortical margin.  The  distractor was removed to the patient's right side.  The thecal sac and  nerve roots retracted on the patient's right side.  Once again the disk  space was reamed and then cut with 10 mm tangent instruments.  Soft tissue  was removed.  Disk space was further curettaged.  Morselized autograft was  packed into the interspace.  A second 10 x 26 mm tangent wedge was then  impacted into place and recessed approximately 2 mm from posterior cortical  margin.   Pedicles of L4 and L5 were then identified using surface landmarks and  intraoperative fluoroscopy.  Superficial bone overlying the pedicles was  removed using high-speed drill.  Each pedicle was then probed using pedicle  awl.  Each pedicle awl tract was then probed and found to be solid within  bone.  Each pedicle awl tract was then tapped with the 5.25 mm screw tap. Each screw tap  hole was probed and found to be solid within bone.  A 6.75 x  45 mm spiral 90-D screws were placed bilaterally at L4.  Then 6.75 x 40 mm  screw was placed bilaterally at L5.  Short segment titanium rods were  contoured to fit over the rods.   Transverse processes at L4 and L5 were  then decorticated using the high-  speed drill.  Morselized autograft was packed posterolaterally for later  fusion -- this was done bilaterally.  The rods were placed over the screw  heads.  The locking caps were then  engaged, with the construct under  compression.  Final examination revealed good position of bone grafts and  hardware at appropriate level with normal alignment of the spine.   The wound was then irrigated with antibiotic solution.  It was then closed  in typical fashion.  Steri-Strips and sterile dressing were applied.  There  were no perioperative complications.  The patient tolerated this very well,  and she returns to the recovery room postoperatively.                                               Henry A. Pool, M.D.    HAP/MEDQ  D:  11/11/2003  T:  11/11/2003  Job:  [id number]

## 2010-12-30 ENCOUNTER — Ambulatory Visit (INDEPENDENT_AMBULATORY_CARE_PROVIDER_SITE_OTHER): Payer: Medicare Other

## 2010-12-30 ENCOUNTER — Inpatient Hospital Stay (INDEPENDENT_AMBULATORY_CARE_PROVIDER_SITE_OTHER)
Admission: RE | Admit: 2010-12-30 | Discharge: 2010-12-30 | Disposition: A | Discharge status: Home / Self Care | Payer: Medicare Other | Source: Ambulatory Visit | Attending: Family Medicine | Admitting: Family Medicine

## 2010-12-30 DIAGNOSIS — M109 Gout, unspecified: Secondary | ICD-10-CM

## 2012-03-07 ENCOUNTER — Other Ambulatory Visit: Payer: Self-pay | Admitting: Neurosurgery

## 2012-03-07 DIAGNOSIS — M48061 Spinal stenosis, lumbar region without neurogenic claudication: Secondary | ICD-10-CM

## 2012-03-13 ENCOUNTER — Ambulatory Visit
Admission: RE | Admit: 2012-03-13 | Discharge: 2012-03-13 | Disposition: A | Discharge status: Home / Self Care | Payer: Medicare Other | Source: Ambulatory Visit | Attending: Neurosurgery | Admitting: Neurosurgery

## 2012-03-13 DIAGNOSIS — M48061 Spinal stenosis, lumbar region without neurogenic claudication: Secondary | ICD-10-CM

## 2012-03-13 MED ORDER — GADOBENATE DIMEGLUMINE 529 MG/ML IV SOLN: 8.0000 mL | Freq: Once | INTRAVENOUS | Status: AC | PRN

## 2014-08-07 DIAGNOSIS — E119 Type 2 diabetes mellitus without complications: Secondary | ICD-10-CM | POA: Diagnosis not present

## 2014-08-07 DIAGNOSIS — Z0000 Encounter for general adult medical examination without abnormal findings: Secondary | ICD-10-CM | POA: Diagnosis not present

## 2014-08-09 DIAGNOSIS — E559 Vitamin D deficiency, unspecified: Secondary | ICD-10-CM | POA: Diagnosis not present

## 2014-08-09 DIAGNOSIS — N189 Chronic kidney disease, unspecified: Secondary | ICD-10-CM | POA: Diagnosis not present

## 2014-08-09 DIAGNOSIS — E785 Hyperlipidemia, unspecified: Secondary | ICD-10-CM | POA: Diagnosis not present

## 2014-08-09 DIAGNOSIS — E119 Type 2 diabetes mellitus without complications: Secondary | ICD-10-CM | POA: Diagnosis not present

## 2015-02-12 DIAGNOSIS — E119 Type 2 diabetes mellitus without complications: Secondary | ICD-10-CM | POA: Diagnosis not present

## 2015-02-12 DIAGNOSIS — Z23 Encounter for immunization: Secondary | ICD-10-CM | POA: Diagnosis not present

## 2015-02-12 DIAGNOSIS — G8929 Other chronic pain: Secondary | ICD-10-CM | POA: Diagnosis not present

## 2015-04-13 ENCOUNTER — Inpatient Hospital Stay (HOSPITAL_COMMUNITY)
Admission: EM | Admit: 2015-04-13 | Discharge: 2015-04-18 | DRG: 175 | Disposition: A | Discharge status: Skilled Nursing Facility | Payer: Medicare Other | Attending: Internal Medicine | Admitting: Internal Medicine

## 2015-04-13 ENCOUNTER — Encounter (HOSPITAL_COMMUNITY): Payer: Self-pay | Admitting: Emergency Medicine

## 2015-04-13 ENCOUNTER — Emergency Department (HOSPITAL_COMMUNITY): Payer: Medicare Other

## 2015-04-13 ENCOUNTER — Other Ambulatory Visit: Payer: Self-pay

## 2015-04-13 DIAGNOSIS — Z9049 Acquired absence of other specified parts of digestive tract: Secondary | ICD-10-CM

## 2015-04-13 DIAGNOSIS — R0902 Hypoxemia: Secondary | ICD-10-CM | POA: Diagnosis present

## 2015-04-13 DIAGNOSIS — M4316 Spondylolisthesis, lumbar region: Secondary | ICD-10-CM | POA: Diagnosis present

## 2015-04-13 DIAGNOSIS — J9811 Atelectasis: Secondary | ICD-10-CM | POA: Diagnosis present

## 2015-04-13 DIAGNOSIS — R7401 Elevation of levels of liver transaminase levels: Secondary | ICD-10-CM | POA: Diagnosis present

## 2015-04-13 DIAGNOSIS — E785 Hyperlipidemia, unspecified: Secondary | ICD-10-CM | POA: Diagnosis present

## 2015-04-13 DIAGNOSIS — E669 Obesity, unspecified: Secondary | ICD-10-CM | POA: Diagnosis present

## 2015-04-13 DIAGNOSIS — R42 Dizziness and giddiness: Secondary | ICD-10-CM | POA: Diagnosis not present

## 2015-04-13 DIAGNOSIS — I2699 Other pulmonary embolism without acute cor pulmonale: Principal | ICD-10-CM | POA: Diagnosis present

## 2015-04-13 DIAGNOSIS — Z981 Arthrodesis status: Secondary | ICD-10-CM

## 2015-04-13 DIAGNOSIS — M6282 Rhabdomyolysis: Secondary | ICD-10-CM | POA: Diagnosis present

## 2015-04-13 DIAGNOSIS — Z8673 Personal history of transient ischemic attack (TIA), and cerebral infarction without residual deficits: Secondary | ICD-10-CM | POA: Diagnosis not present

## 2015-04-13 DIAGNOSIS — E872 Acidosis: Secondary | ICD-10-CM | POA: Diagnosis not present

## 2015-04-13 DIAGNOSIS — E876 Hypokalemia: Secondary | ICD-10-CM | POA: Diagnosis present

## 2015-04-13 DIAGNOSIS — I2609 Other pulmonary embolism with acute cor pulmonale: Secondary | ICD-10-CM | POA: Diagnosis not present

## 2015-04-13 DIAGNOSIS — R740 Nonspecific elevation of levels of transaminase and lactic acid dehydrogenase [LDH]: Secondary | ICD-10-CM | POA: Diagnosis not present

## 2015-04-13 DIAGNOSIS — I272 Other secondary pulmonary hypertension: Secondary | ICD-10-CM | POA: Diagnosis present

## 2015-04-13 DIAGNOSIS — W19XXXA Unspecified fall, initial encounter: Secondary | ICD-10-CM | POA: Diagnosis present

## 2015-04-13 DIAGNOSIS — F0390 Unspecified dementia without behavioral disturbance: Secondary | ICD-10-CM | POA: Diagnosis present

## 2015-04-13 DIAGNOSIS — R7989 Other specified abnormal findings of blood chemistry: Secondary | ICD-10-CM

## 2015-04-13 DIAGNOSIS — N179 Acute kidney failure, unspecified: Secondary | ICD-10-CM | POA: Diagnosis not present

## 2015-04-13 DIAGNOSIS — I82403 Acute embolism and thrombosis of unspecified deep veins of lower extremity, bilateral: Secondary | ICD-10-CM

## 2015-04-13 DIAGNOSIS — R278 Other lack of coordination: Secondary | ICD-10-CM | POA: Diagnosis not present

## 2015-04-13 DIAGNOSIS — Z833 Family history of diabetes mellitus: Secondary | ICD-10-CM

## 2015-04-13 DIAGNOSIS — R6883 Chills (without fever): Secondary | ICD-10-CM

## 2015-04-13 DIAGNOSIS — R748 Abnormal levels of other serum enzymes: Secondary | ICD-10-CM | POA: Diagnosis present

## 2015-04-13 DIAGNOSIS — S79912A Unspecified injury of left hip, initial encounter: Secondary | ICD-10-CM | POA: Diagnosis not present

## 2015-04-13 DIAGNOSIS — E874 Mixed disorder of acid-base balance: Secondary | ICD-10-CM | POA: Diagnosis present

## 2015-04-13 DIAGNOSIS — R404 Transient alteration of awareness: Secondary | ICD-10-CM | POA: Diagnosis not present

## 2015-04-13 DIAGNOSIS — Z7982 Long term (current) use of aspirin: Secondary | ICD-10-CM | POA: Diagnosis not present

## 2015-04-13 DIAGNOSIS — I130 Hypertensive heart and chronic kidney disease with heart failure and stage 1 through stage 4 chronic kidney disease, or unspecified chronic kidney disease: Secondary | ICD-10-CM | POA: Diagnosis present

## 2015-04-13 DIAGNOSIS — M810 Age-related osteoporosis without current pathological fracture: Secondary | ICD-10-CM | POA: Diagnosis present

## 2015-04-13 DIAGNOSIS — E875 Hyperkalemia: Secondary | ICD-10-CM | POA: Diagnosis present

## 2015-04-13 DIAGNOSIS — Z87891 Personal history of nicotine dependence: Secondary | ICD-10-CM

## 2015-04-13 DIAGNOSIS — M1990 Unspecified osteoarthritis, unspecified site: Secondary | ICD-10-CM | POA: Diagnosis not present

## 2015-04-13 DIAGNOSIS — M549 Dorsalgia, unspecified: Secondary | ICD-10-CM | POA: Diagnosis not present

## 2015-04-13 DIAGNOSIS — G9341 Metabolic encephalopathy: Secondary | ICD-10-CM | POA: Diagnosis present

## 2015-04-13 DIAGNOSIS — K900 Celiac disease: Secondary | ICD-10-CM | POA: Diagnosis present

## 2015-04-13 DIAGNOSIS — M109 Gout, unspecified: Secondary | ICD-10-CM | POA: Diagnosis present

## 2015-04-13 DIAGNOSIS — L8990 Pressure ulcer of unspecified site, unspecified stage: Secondary | ICD-10-CM | POA: Diagnosis present

## 2015-04-13 DIAGNOSIS — N183 Chronic kidney disease, stage 3 (moderate): Secondary | ICD-10-CM | POA: Diagnosis present

## 2015-04-13 DIAGNOSIS — Z8249 Family history of ischemic heart disease and other diseases of the circulatory system: Secondary | ICD-10-CM | POA: Diagnosis not present

## 2015-04-13 DIAGNOSIS — I5032 Chronic diastolic (congestive) heart failure: Secondary | ICD-10-CM | POA: Diagnosis present

## 2015-04-13 DIAGNOSIS — M170 Bilateral primary osteoarthritis of knee: Secondary | ICD-10-CM | POA: Diagnosis present

## 2015-04-13 DIAGNOSIS — R410 Disorientation, unspecified: Secondary | ICD-10-CM | POA: Diagnosis not present

## 2015-04-13 DIAGNOSIS — R220 Localized swelling, mass and lump, head: Secondary | ICD-10-CM | POA: Diagnosis not present

## 2015-04-13 DIAGNOSIS — S299XXA Unspecified injury of thorax, initial encounter: Secondary | ICD-10-CM | POA: Diagnosis not present

## 2015-04-13 DIAGNOSIS — R778 Other specified abnormalities of plasma proteins: Secondary | ICD-10-CM | POA: Diagnosis present

## 2015-04-13 DIAGNOSIS — M6281 Muscle weakness (generalized): Secondary | ICD-10-CM | POA: Diagnosis not present

## 2015-04-13 HISTORY — DX: Pure hypercholesterolemia, unspecified: E78.00

## 2015-04-13 HISTORY — DX: Dorsalgia, unspecified: M54.9

## 2015-04-13 HISTORY — DX: Age-related osteoporosis without current pathological fracture: M81.0

## 2015-04-13 HISTORY — DX: Unspecified osteoarthritis, unspecified site: M19.90

## 2015-04-13 HISTORY — DX: Unspecified dementia, unspecified severity, without behavioral disturbance, psychotic disturbance, mood disturbance, and anxiety: F03.90

## 2015-04-13 HISTORY — DX: Obesity, unspecified: E66.9

## 2015-04-13 HISTORY — DX: Cerebral infarction, unspecified: I63.9

## 2015-04-13 HISTORY — DX: Gastrointestinal hemorrhage, unspecified: K92.2

## 2015-04-13 HISTORY — DX: Celiac disease: K90.0

## 2015-04-13 HISTORY — DX: Hyperlipidemia, unspecified: E78.5

## 2015-04-13 LAB — CBC WITH DIFFERENTIAL/PLATELET
Basophils Absolute: 0 10*3/uL (ref 0.0–0.1)
Basophils Relative: 0 %
Eosinophils Absolute: 0 10*3/uL (ref 0.0–0.7)
Eosinophils Relative: 0 %
HCT: 46.1 % — ABNORMAL HIGH (ref 36.0–46.0)
Hemoglobin: 15.9 g/dL — ABNORMAL HIGH (ref 12.0–15.0)
Lymphocytes Relative: 4 %
Lymphs Abs: 0.7 10*3/uL (ref 0.7–4.0)
MCH: 32.1 pg (ref 26.0–34.0)
MCHC: 34.5 g/dL (ref 30.0–36.0)
MCV: 93.1 fL (ref 78.0–100.0)
Monocytes Absolute: 1.8 10*3/uL — ABNORMAL HIGH (ref 0.1–1.0)
Monocytes Relative: 10 %
Neutro Abs: 15.5 10*3/uL — ABNORMAL HIGH (ref 1.7–7.7)
Neutrophils Relative %: 86 %
Platelets: 235 10*3/uL (ref 150–400)
RBC: 4.95 MIL/uL (ref 3.87–5.11)
RDW: 14.6 % (ref 11.5–15.5)
WBC: 18 10*3/uL — ABNORMAL HIGH (ref 4.0–10.5)

## 2015-04-13 LAB — COMPREHENSIVE METABOLIC PANEL
ALT: 50 U/L (ref 14–54)
AST: 120 U/L — ABNORMAL HIGH (ref 15–41)
Albumin: 3.3 g/dL — ABNORMAL LOW (ref 3.5–5.0)
Alkaline Phosphatase: 103 U/L (ref 38–126)
Anion gap: 11 (ref 5–15)
BUN: 41 mg/dL — ABNORMAL HIGH (ref 6–20)
CO2: 25 mmol/L (ref 22–32)
Calcium: 9.1 mg/dL (ref 8.9–10.3)
Chloride: 102 mmol/L (ref 101–111)
Creatinine, Ser: 1.28 mg/dL — ABNORMAL HIGH (ref 0.44–1.00)
GFR calc Af Amer: 46 mL/min — ABNORMAL LOW (ref >60)
GFR calc non Af Amer: 39 mL/min — ABNORMAL LOW (ref >60)
Glucose, Bld: 157 mg/dL — ABNORMAL HIGH (ref 65–99)
Potassium: 5.9 mmol/L — ABNORMAL HIGH (ref 3.5–5.1)
Sodium: 138 mmol/L (ref 135–145)
Total Bilirubin: 2.3 mg/dL — ABNORMAL HIGH (ref 0.3–1.2)
Total Protein: 7.1 g/dL (ref 6.5–8.1)

## 2015-04-13 LAB — CK: Total CK: 1210 U/L — ABNORMAL HIGH (ref 38–234)

## 2015-04-13 LAB — BLOOD GAS, ARTERIAL
Acid-Base Excess: 0.1 mmol/L (ref 0.0–2.0)
Bicarbonate: 23 mEq/L (ref 20.0–24.0)
Drawn by: 307971
O2 Content: 5 L/min
O2 Saturation: 89.8 %
Patient temperature: 98.6
TCO2: 19.7 mmol/L (ref 0–100)
pCO2 arterial: 33.7 mmHg — ABNORMAL LOW (ref 35.0–45.0)
pH, Arterial: 7.449 (ref 7.350–7.450)
pO2, Arterial: 58.7 mmHg — ABNORMAL LOW (ref 80.0–100.0)

## 2015-04-13 LAB — I-STAT TROPONIN, ED: Troponin i, poc: 0.09 ng/mL (ref 0.00–0.08)

## 2015-04-13 LAB — URINALYSIS, ROUTINE W REFLEX MICROSCOPIC
Bilirubin Urine: NEGATIVE
Glucose, UA: 100 mg/dL — AB
Ketones, ur: NEGATIVE mg/dL
Leukocytes, UA: NEGATIVE
Nitrite: NEGATIVE
Protein, ur: >300 mg/dL — AB
Specific Gravity, Urine: 1.036 — ABNORMAL HIGH (ref 1.005–1.030)
Urobilinogen, UA: 0.2 mg/dL (ref 0.0–1.0)
pH: 6 (ref 5.0–8.0)

## 2015-04-13 LAB — BRAIN NATRIURETIC PEPTIDE: B Natriuretic Peptide: 171.7 pg/mL — ABNORMAL HIGH (ref 0.0–100.0)

## 2015-04-13 LAB — CBG MONITORING, ED: Glucose-Capillary: 163 mg/dL — ABNORMAL HIGH (ref 65–99)

## 2015-04-13 LAB — URINE MICROSCOPIC-ADD ON

## 2015-04-13 LAB — I-STAT CG4 LACTIC ACID, ED: Lactic Acid, Venous: 3.08 mmol/L (ref 0.5–2.0)

## 2015-04-13 LAB — PROTIME-INR
INR: 0.99 (ref 0.00–1.49)
Prothrombin Time: 13.3 seconds (ref 11.6–15.2)

## 2015-04-13 MED ORDER — MORPHINE SULFATE (PF) 2 MG/ML IV SOLN
2.0000 mg | INTRAVENOUS | Status: DC | PRN
Start: 2015-04-13 — End: 2015-04-17

## 2015-04-13 MED ORDER — SODIUM CHLORIDE 0.9 % IV SOLN: INTRAVENOUS | Status: DC

## 2015-04-13 MED ORDER — ENOXAPARIN SODIUM 100 MG/ML ~~LOC~~ SOLN
85.0000 mg | Freq: Two times a day (BID) | SUBCUTANEOUS | Status: DC
  Administered 2015-04-13 – 2015-04-14 (×3): 85 mg via SUBCUTANEOUS
  Filled 2015-04-13 (×5): qty 1

## 2015-04-13 MED ORDER — METOPROLOL TARTRATE 1 MG/ML IV SOLN
5.0000 mg | Freq: Once | INTRAVENOUS | Status: DC
  Filled 2015-04-13: qty 5

## 2015-04-13 MED ORDER — ONDANSETRON HCL 4 MG PO TABS: 4.0000 mg | ORAL_TABLET | Freq: Four times a day (QID) | ORAL | Status: DC | PRN

## 2015-04-13 MED ORDER — IOHEXOL 350 MG/ML SOLN
100.0000 mL | Freq: Once | INTRAVENOUS | Status: AC | PRN
  Administered 2015-04-13: 80 mL via INTRAVENOUS

## 2015-04-13 MED ORDER — ONDANSETRON HCL 4 MG/2ML IJ SOLN: 4.0000 mg | Freq: Four times a day (QID) | INTRAMUSCULAR | Status: DC | PRN

## 2015-04-13 MED ORDER — SODIUM CHLORIDE 0.9 % IJ SOLN
3.0000 mL | Freq: Two times a day (BID) | INTRAMUSCULAR | Status: DC
  Administered 2015-04-14 – 2015-04-18 (×9): 3 mL via INTRAVENOUS

## 2015-04-13 MED ORDER — SODIUM CHLORIDE 0.9 % IV BOLUS (SEPSIS)
1000.0000 mL | Freq: Once | INTRAVENOUS | Status: AC
  Administered 2015-04-13: 1000 mL via INTRAVENOUS

## 2015-04-13 MED ORDER — DEXTROSE 5 % IV SOLN
1.0000 g | Freq: Once | INTRAVENOUS | Status: AC
  Administered 2015-04-13: 1 g via INTRAVENOUS
  Filled 2015-04-13: qty 10

## 2015-04-13 MED ORDER — ACETAMINOPHEN 650 MG RE SUPP: 650.0000 mg | Freq: Four times a day (QID) | RECTAL | Status: DC | PRN

## 2015-04-13 MED ORDER — ACETAMINOPHEN 325 MG PO TABS
650.0000 mg | ORAL_TABLET | Freq: Four times a day (QID) | ORAL | Status: DC | PRN
  Administered 2015-04-16: 650 mg via ORAL
  Filled 2015-04-13: qty 2

## 2015-04-13 MED ORDER — SODIUM CHLORIDE 0.9 % IV BOLUS (SEPSIS): 1000.0000 mL | Freq: Once | INTRAVENOUS | Status: DC

## 2015-04-13 MED ORDER — SENNOSIDES-DOCUSATE SODIUM 8.6-50 MG PO TABS
1.0000 | ORAL_TABLET | Freq: Every evening | ORAL | Status: DC | PRN
Start: 2015-04-13 — End: 2015-04-18

## 2015-04-13 MED ORDER — DEXTROSE 5 % IV SOLN
500.0000 mg | Freq: Once | INTRAVENOUS | Status: AC
  Administered 2015-04-13: 500 mg via INTRAVENOUS
  Filled 2015-04-13: qty 500

## 2015-04-13 NOTE — ED Notes (Signed)
RN unable to get weight on Pt while in the ED.  Pt unsure and family also unsure.  No recent medical visit in chart with recent weight.  Pt will be weighed once she reaches the floor.

## 2015-04-13 NOTE — ED Notes (Signed)
Regenia Skeeter, EDP made aware of patient i-stat troponin result.

## 2015-04-13 NOTE — ED Notes (Signed)
Bed: [id number] Expected date: 04/13/15 Expected time: 3:38 PM Means of arrival: Ambulance Comments: Fall

## 2015-04-13 NOTE — ED Notes (Signed)
Pt is from home.  Son was on scene, thinks she fell around 1 am.  Pt was found on floor incontinent of urine and stool.  Pt not c/o pain but does state that she feels a little lightheaded.  Son states that Pt is at baseline.  She is in early stage of dementia.  CBG 208.

## 2015-04-13 NOTE — ED Notes (Signed)
Carrie Smith, EDP made aware of patient CG4 lactic result.

## 2015-04-13 NOTE — ED Notes (Signed)
MD at bedside. 

## 2015-04-13 NOTE — ED Notes (Signed)
While drawing the pts blood the pt fell asleep and woke up startled by her visitors, jerking her hand away causing a bruise to her left hand.

## 2015-04-13 NOTE — ED Notes (Signed)
Bairhugger initiated.

## 2015-04-13 NOTE — ED Notes (Signed)
Pt has had significant IV issues.  Pt will move arm which causes alarm on IV pumps.  RN has been to room at least 6 times per hour to reposition Pt.  RN has used arm board for IV but Pt still manages to move arm causing alarm.  This has caused slower administration of fluids and meds.  Pt is extremely hard IV stick with 3 ultrasound IV attempts to get one good IV placement.

## 2015-04-13 NOTE — ED Notes (Signed)
Patient transported to CT 

## 2015-04-13 NOTE — ED Provider Notes (Signed)
CSN: [id number]     Arrival date & time 04/13/15  1545 History   First MD Initiated Contact with Patient 04/13/15 1551     Chief Complaint  Patient presents with  . Fall     (Consider location/radiation/quality/duration/timing/severity/associated sxs/prior Treatment) HPI  79 year old female presents from home after a fall. According to EMS the son tells them that the patient has early stages of dementia and felt lightheaded today. Unwitnessed fall. Patient was found on the floor incontinent of urine and stool. Patient denies any complaints at this time. Denies headache, chest pain, or shortness of breath. Patient states she did not fall, but lowered herself to the ground.   No past medical history on file. No past surgical history on file. No family history on file. Social History  Substance Use Topics  . Smoking status: Not on file  . Smokeless tobacco: Not on file  . Alcohol Use: Not on file   OB History    No data available     Review of Systems  Unable to perform ROS: Dementia      Allergies  Codeine  Home Medications   Prior to Admission medications   Medication Sig Start Date End Date Taking? Authorizing Provider  allopurinol (ZYLOPRIM) 300 MG tablet TAKE 1 TABLET EVERY DAY TO PREVENT GOUT 03/01/15   Historical Provider, MD  atorvastatin (LIPITOR) 80 MG tablet Take 80 mg by mouth daily. 03/06/15   Historical Provider, MD  furosemide (LASIX) 40 MG tablet Take 40 mg by mouth daily as needed for fluid.  02/03/15   Historical Provider, MD  traMADol (ULTRAM) 50 MG tablet Take 1-2 tablets by mouth every 4 (four) hours as needed. Max 5 per day 02/12/15   Historical Provider, MD   There were no vitals taken for this visit. Physical Exam  Constitutional: She is oriented to person, place, and time. She appears well-developed and well-nourished.  HENT:  Head: Normocephalic and atraumatic.  Right Ear: External ear normal.  Left Ear: External ear normal.  Nose: Nose normal.   Eyes: EOM are normal. Pupils are equal, round, and reactive to light. Right eye exhibits no discharge. Left eye exhibits no discharge.  Neck: Neck supple.  Cardiovascular: Normal rate, regular rhythm and normal heart sounds.   No murmur heard. Pulmonary/Chest: Effort normal.  Mild rales RLB  Abdominal: Soft. There is no tenderness.  Musculoskeletal:  Initial pain with ROM of left knee/hip, but when repeated has no pain. No tenderness to either hip, knee or focal bony tenderness  Neurological: She is alert and oriented to person, place, and time.  Alert, oriented to place and time. Seems confused as to why she is in ER. Cn 2-12 grossly intact. 5/5 strength in upper extremities. Patient cannot lift lower extremities off bed but can wiggle toes  Skin: Skin is warm and dry.  Nursing note and vitals reviewed.   ED Course  Procedures (including critical care time) Labs Review Labs Reviewed  COMPREHENSIVE METABOLIC PANEL - Abnormal; Notable for the following:    Potassium 5.9 (*)    Glucose, Bld 157 (*)    BUN 41 (*)    Creatinine, Ser 1.28 (*)    Albumin 3.3 (*)    AST 120 (*)    Total Bilirubin 2.3 (*)    GFR calc non Af Amer 39 (*)    GFR calc Af Amer 46 (*)    All other components within normal limits  CBC WITH DIFFERENTIAL/PLATELET - Abnormal; Notable for the following:  WBC 18.0 (*)    Hemoglobin 15.9 (*)    HCT 46.1 (*)    Neutro Abs 15.5 (*)    Monocytes Absolute 1.8 (*)    All other components within normal limits  URINALYSIS, ROUTINE W REFLEX MICROSCOPIC (NOT AT [facility]) - Abnormal; Notable for the following:    Color, Urine AMBER (*)    Specific Gravity, Urine 1.036 (*)    Glucose, UA 100 (*)    Hgb urine dipstick LARGE (*)    Protein, ur >300 (*)    All other components within normal limits  CK - Abnormal; Notable for the following:    Total CK 1210 (*)    All other components within normal limits  BRAIN NATRIURETIC PEPTIDE - Abnormal; Notable for the following:     B Natriuretic Peptide 171.7 (*)    All other components within normal limits  BLOOD GAS, ARTERIAL - Abnormal; Notable for the following:    pCO2 arterial 33.7 (*)    pO2, Arterial 58.7 (*)    All other components within normal limits  URINE MICROSCOPIC-ADD ON - Abnormal; Notable for the following:    Casts HYALINE CASTS (*)    All other components within normal limits  I-STAT TROPOININ, ED - Abnormal; Notable for the following:    Troponin i, poc 0.09 (*)    All other components within normal limits  CBG MONITORING, ED - Abnormal; Notable for the following:    Glucose-Capillary 163 (*)    All other components within normal limits  I-STAT CG4 LACTIC ACID, ED - Abnormal; Notable for the following:    Lactic Acid, Venous 3.08 (*)    All other components within normal limits  URINE CULTURE  CULTURE, BLOOD (ROUTINE X 2)  CULTURE, BLOOD (ROUTINE X 2)  MRSA PCR SCREENING  PROTIME-INR  COMPREHENSIVE METABOLIC PANEL  CBC  TROPONIN I  TROPONIN I  TROPONIN I  POTASSIUM  CK    Imaging Review Dg Chest 1 View  04/13/2015  CLINICAL DATA:  Found on floor. Concern for chest injury. Initial encounter. EXAM: CHEST 1 VIEW COMPARISON:  None. FINDINGS: There is elevation of the right hemidiaphragm. There is no evidence of focal opacification, pleural effusion or pneumothorax. The cardiomediastinal silhouette is borderline normal in size. No acute osseous abnormalities are seen. There is chronic elevation of the right humeral head, reflecting chronic rotator cuff tear. IMPRESSION: Elevation of the right hemidiaphragm. Lungs remain grossly clear. No displaced rib fracture seen. Electronically Signed   By: Garald Balding M.D.   On: 04/13/2015 18:28   Ct Head Wo Contrast  04/13/2015  CLINICAL DATA:  Fall. Found on floor incontinent of urine and stool. Lightheaded. Initial encounter. EXAM: CT HEAD WITHOUT CONTRAST TECHNIQUE: Contiguous axial images were obtained from the base of the skull through the  vertex without intravenous contrast. COMPARISON:  Brain MRI 10/30/2008 FINDINGS: Multiple small infarcts are present in the right cerebellum, generally chronic in appearance although increased in number from the prior MRI. There may be a new small infarct inferiorly in the left cerebellum. There is moderate enlargement of the ventricles which is slightly more prominent than on the prior study. The cerebral sulci are normal in size. A small, chronic left occipital cortical infarct is again seen. Periventricular and subcortical cerebral white matter hypodensities are nonspecific but compatible with moderate chronic small vessel ischemic disease. There is no evidence of acute large territory infarct, intracranial hemorrhage, mass, midline shift, or extra-axial fluid collection. Right frontotemporal scalp swelling is  noted. The orbits are unremarkable. Mild debris is noted in the left sphenoid sinus. The mastoid air cells are clear. No skull fracture is identified. Calcified atherosclerosis is noted at the skull base. IMPRESSION: 1. Right frontotemporal scalp swelling. No skull fracture or intracranial hemorrhage identified. 2. Multiple small infarcts in the cerebellum, increased in number from 2010. These are overall chronic in appearance, however superimposed acute or subacute ischemia is not excluded given the interval change. 3. Chronic left occipital infarct and chronic small vessel ischemic disease in the cerebral white matter. 4. Moderate ventriculomegaly, slightly increased from the prior MRI. This may reflect central predominant cerebral atrophy, however normal pressure hydrocephalus is not excluded in the appropriate clinical setting. Electronically Signed   By: Logan Bores M.D.   On: 04/13/2015 18:17   Ct Angio Chest Pe W/cm &/or Wo Cm  04/13/2015  CLINICAL DATA:  Patient found on floor unresponsive. Desaturation. Initial encounter. EXAM: CT ANGIOGRAPHY CHEST WITH CONTRAST TECHNIQUE: Multidetector CT  imaging of the chest was performed using the standard protocol during bolus administration of intravenous contrast. Multiplanar CT image reconstructions and MIPs were obtained to evaluate the vascular anatomy. CONTRAST:  28mL OMNIPAQUE IOHEXOL 350 MG/ML SOLN COMPARISON:  Chest radiograph performed earlier today at 6:12 p.m. FINDINGS: Segmental pulmonary emboli are noted within pulmonary arteries to the right lower lobe and left upper lobe, and smaller emboli within the pulmonary arteries to the right upper lobe and left lower lobe. The RV/LV ratio of 0.96 corresponds to concern for submassive pulmonary embolus. Mild patchy right-sided atelectasis is noted. The lungs are otherwise grossly clear. There is no evidence of pleural effusion or pneumothorax. No masses are identified; no abnormal focal contrast enhancement is seen. Scattered mediastinal nodes remain normal in size. No pericardial effusion is identified. Scattered calcification is noted along the thoracic aorta. The great vessels are grossly unremarkable. No axillary lymphadenopathy is seen. The thyroid gland is unremarkable in appearance. The visualized portions of the liver and spleen are unremarkable. No acute osseous abnormalities are seen. Review of the MIP images confirms the above findings. IMPRESSION: 1. Segmental pulmonary emboli within pulmonary arteries to the right lower lobe and left upper lobe, and smaller emboli within the pulmonary arteries to the right upper lobe and left lower lobe. CT evidence of right heart strain (RV/LV Ratio = 0.96) consistent with at least submassive (intermediate risk) PE. The presence of right heart strain has been associated with an increased risk of morbidity and mortality. Please activate Code PE by paging [phone]. 2. Mild patchy right-sided atelectasis noted. Lungs otherwise clear. Critical Value/emergent results were called by telephone at the time of interpretation on 04/13/2015 at 8:31 pm to Dr. Sherwood Gambler, who verbally acknowledged these results. Electronically Signed   By: Garald Balding M.D.   On: 04/13/2015 20:31   Dg Hip Unilat With Pelvis 2-3 Views Left  04/13/2015  CLINICAL DATA:  Status post fall. Concern for left hip injury. Initial encounter. EXAM: DG HIP (WITH OR WITHOUT PELVIS) 2-3V LEFT COMPARISON:  None. FINDINGS: There is no evidence of fracture or dislocation. Both femoral heads are seated normally within their respective acetabula. The proximal left femur appears intact. Mild sclerotic change is noted at the left sacroiliac joint. The patient is status post lumbar spinal fusion at L4-L5. The visualized bowel gas pattern is grossly unremarkable in appearance. IMPRESSION: No evidence of fracture or dislocation. Electronically Signed   By: Garald Balding M.D.   On: 04/13/2015 18:29   I have  personally reviewed and evaluated these images and lab results as part of my medical decision-making.   EKG Interpretation None      ED ECG REPORT   Date: 04/13/2015  Rate: 113  Rhythm: Atrial fibrillation vs sinus with artifact  QRS Axis: normal  Intervals: unable to determine  ST/T Wave abnormalities: nonspecific T wave changes  Conduction Disutrbances:none  Narrative Interpretation: Artifact significantly limits interpretation of ECG  Old EKG Reviewed: changes noted  I have personally reviewed the EKG tracing and agree with the computerized printout as noted.   CRITICAL CARE Performed by: Sherwood Gambler T   Total critical care time: 45 minutes  Critical care time was exclusive of separately billable procedures and treating other patients.  Critical care was necessary to treat or prevent imminent or life-threatening deterioration.  Critical care was time spent personally by me on the following activities: development of treatment plan with patient and/or surrogate as well as nursing, discussions with consultants, evaluation of patient's response to treatment,  examination of patient, obtaining history from patient or surrogate, ordering and performing treatments and interventions, ordering and review of laboratory studies, ordering and review of radiographic studies, pulse oximetry and re-evaluation of patient's condition.  MDM   Final diagnoses:  Chills  Pulmonary Embolism Hypoxia Lactic acidosis  Patient presents mildly confused and newly hypoxic to 80% on room air. Initially treated as sepsis with IV Rocephin and azithromycin for potentially pneumonia as well as what seemed like an initial UTI. However x-rays clear and her urine shows blood without RBCs. She does have a mildly elevated CK which would indicate early rhabdomyolysis given she was on the floor for several hours without any help. She was given multiple boluses of IV fluids for this as well as the lactic acidosis. Given her hypoxia, CT scan was obtained and shows multiple segmental pulmonary emboli that is likely causing her multiple findings. ICU was consulted given this is a code PE and they recommend no thrombo-lysis or advanced care other than blood thinners. Will be started on heparin. Patient to be admitted to the stepdown unit. She has mild increased work of breathing but no signs of restrictive failure or impending respiratory failure. Admit to the hospitalist.    Sherwood Gambler, MD 04/13/15 2325

## 2015-04-13 NOTE — ED Notes (Signed)
Hospitalist bedside and states that RN is not to guesstimate Pts weight.  We are to get Pt to the floor to be weighed and Heparin will be dosed at that time.

## 2015-04-13 NOTE — H&P (Signed)
History and Physical  Patient Name: Carrie Smith     [id number]    DOB: Jun 27, 1935    DOA: 04/13/2015 Referring physician: Sherwood Gambler, MD PCP: Dr. Kreg Sh[facility]     Chief Complaint: Fall, confusion  HPI: Carrie Smith is a 79 y.o. female with a past medical history significant for moderate dementia, celiac disease and gout who presents with fall and confusion and hypoxia.  History is collected mostly from son, [name], who is an EMT.  The patient was last believed to be in her usual state of health yesterday evening.  Today at 1p, the patient's son came to her house and found her lying on the living room floor in feces and urine and confused and unable to state what had happened.  He helped her up and he and his wife bathed her while calling for EMS.  The patient appeared much more confused than her baseline, was breathing heavily, was globally very weak, and complained of some left leg discomfort.  In the ED, the patient was hypoxic to 80%, tachycardic and tachypneic.  An ABG showed a mild respiratory alkalosis and hypoxia and she was started empirically on CAP treatment.  A CK was elevated mildly, a TNI was mildly elevated, and a CT angiogram of the chest showed bilateral segmental PE with evidence of RHS so Code PE was called.  Per discussion with pulmonology, the harm/benefit ratio of thrombolysis favored withholding it at this time.      Review of Systems:  Pt complains of feeling weak. Family describes heavy breathing, global weakness, left leg soreness (possibly for 2 weeks), confusion. Pt denies any chest discomfort, shortness of breath, pleuritic pain.  In discussion with family and the patient, all other systems negative except as just noted or noted in the history of present illness.   Allergies: Codeine intolerance.   Home medications: 1. Atorvastatin 80 mg daily 2. Allopurinol 300 mg daily 3. Cimetidine 800 mg nightly 4. Furosemide 40 mg daily 5.  Metolazone 2.5 mg daily 6. Nortriptyline 25 mg nightly 7. Tramadol 50-100 mg as needed for pain  Past medical history: 1. Gout 2. Hyperlipidemia 3. Celiac disease (versus ischemic colitis versus microscopic colitis), quiescent  Past surgical history: 1. Back surgery 2. Cholecystectomy years ago  Family history:  Mother, diabetes, heart disease. Father, heart attack, sudden massive. Sister, heart disease, ?blood clot.   Social History:  Patient lives alone. She ambulates with a walker. She is mild to moderately demented at baseline, able to have a conversation and remember friends and family, but the dependent for all IADLs.  Former smoker.      Physical Exam: BP 176/91 mmHg  Pulse 122  Temp(Src) 98.5 F (36.9 C) (Oral)  Resp 21  SpO2 93% General appearance: Well-developed, elderly adult female, snoring at first, but rousable to sternal rub and in no acute distress, but ill appearing and weak.  Pleasant and joking, but tired. Eyes: Scleral edema and erythema.     ENT: No nasal deformity, discharge, or epistaxis.  OP moist with punctate whitish lesions at the back of the mouth, no surrounding erythema. Dentures in place.  Skin: Warm and dry.  No suspicious rashes or lesions.  Chronic mild venous stasis changes to legs. Cardiac: Tachycardic, nl S1-S2, no murmurs appreciated.  Mild non-pitting bilateral edema.   Respiratory: Tachypnea.  No rales or wheezes. Abdomen: Abdomen soft without rigidity.  No TTP. No ascites, distension.   MSK: No deformities or effusions. Neuro:  Oriented to person.  Unable to recall events of today or this week.  Disoriented to place.  Responding to questions, attention within normal limits.  Speech is fluent.  Moves arms and legs, strength 4/5, appears symmetric.  Cranial nerves 3-12 intact.   Psych: Affect pleasant.  No evidence of aural or visual hallucinations or delusions.       Labs on Admission:  The metabolic panel shows normal sodium,  bicarbonate. The BUN to creatinine ratio is elevated. The serum creatinine is 1.3 mg/dL with no previous. The potassium is 5.9 mmol per liter. The AST is 120 U/L, and total bilirubin is elevated to 2.3 mg/dL. Urinalysis shows casts and is otherwise bland.  The BNP is 171 pg per mL. INR is normal. The CK is 1200 The troponin is 0.09 ng per mL. The complete blood count shows leukocytosis.   Radiological Exams on Admission: Personally reviewed: Dg Chest 1 View 04/13/2015  Right sided opacity, perihilar.     Ct Head Wo Contrast 04/13/2015   Normal for age.     Ct Angio Chest Pe W/cm &/or Wo Cm 04/13/2015 IMPRESSION:  1. Segmental pulmonary emboli within pulmonary arteries to the right lower lobe and left upper lobe, and smaller emboli within the pulmonary arteries to the right upper lobe and left lower lobe. CT evidence of right heart strain (RV/LV Ratio = 0.96) consistent with at least submassive (intermediate risk) PE. The presence of right heart strain has been associated with an increased risk of morbidity and mortality. Please activate Code PE by paging [phone].  2. Mild patchy right-sided atelectasis noted. Lungs otherwise clear.    Personally reviewed: Dg Hip Unilat With Pelvis 2-3 Views Left 04/13/2015   No fracture.   EKG: Independently reviewed. Poor quality ECG in ER shows atrial fibrillation without clear ST changes.  Repeat shows clear sinus tachycardia with PACs.    Assessment/Plan 1. PE:  This is new.       -Admit to SDU with telemetry monitoring -Enoxaparin 1 mg/kg BID for PE -Echocardiogram -LE duplex US later during hospitalization when stable -Antibiotics were empirically started in the ED based on CXR, leukocytosis and hypoxia.   They are due to be dosed again tomorrow at 4p, however, her history, CT findings and laboratory studies are all better explained by bilateral PE, and so I will defer re-ordering at this time unless the patient develops  fever or worsening WBC despite fluids.   2. Elevated troponin:  This is new.  Suspect this is demand related. -Trend TNI  3. Elevated CK:  -Fluid resuscitation and trend CK  4. Possible AKI:  No baseline serum creatinine.  Will repeat BMP tomorrow after fluid resuscitation to re-evaluate renal function.  5. Elevated transaminases:  Again, suspect this is due to PE. -Fluid resuscitation and trend LFT  6. Celiac disease:  Non-contributory.  7. Gout and hyperlipidemia:  Stable and noncontributory. -Hold statin and allopurinol for now     DVT PPx: Lovenox, therapeutically dosed Diet: Regular, as tolerated Consultants: None Code Status: Full Family Communication: The patient's diagnosis, planned work up and treatment plan were discussed with son, [name], and daughter present at the bedside.  All questions were answered. CODE STATUS was confirmed explicitly.  I do not believe the patient has capacity for decision-making at this time.  The family believe she may have HCPOA paperwork at home, and that she may even have a living will with wishes not to be resuscitated, but they are uncertain and unwilling  to confirm DNR status without this.    Medical decision making: What exists of the patient's previous chart was reviewed in depth and the case was discussed with Dr. Regenia Skeeter and Dr. Melvyn Novas at [facility]. Patient seen 9:50 PM on 04/13/2015.  Disposition Plan:  Admit to SDU for close monitoring, anticoagulation, fluids and echocardiogram.      Suann Larry [facility] Pager [phone]

## 2015-04-14 ENCOUNTER — Inpatient Hospital Stay (HOSPITAL_COMMUNITY): Payer: Medicare Other

## 2015-04-14 ENCOUNTER — Encounter (HOSPITAL_COMMUNITY): Payer: Self-pay | Admitting: Physician Assistant

## 2015-04-14 ENCOUNTER — Other Ambulatory Visit (HOSPITAL_COMMUNITY): Payer: Medicare Other

## 2015-04-14 DIAGNOSIS — L8990 Pressure ulcer of unspecified site, unspecified stage: Secondary | ICD-10-CM | POA: Diagnosis present

## 2015-04-14 DIAGNOSIS — G9340 Encephalopathy, unspecified: Secondary | ICD-10-CM | POA: Insufficient documentation

## 2015-04-14 DIAGNOSIS — N179 Acute kidney failure, unspecified: Secondary | ICD-10-CM

## 2015-04-14 DIAGNOSIS — I2699 Other pulmonary embolism without acute cor pulmonale: Secondary | ICD-10-CM

## 2015-04-14 DIAGNOSIS — I82403 Acute embolism and thrombosis of unspecified deep veins of lower extremity, bilateral: Secondary | ICD-10-CM

## 2015-04-14 DIAGNOSIS — E669 Obesity, unspecified: Secondary | ICD-10-CM | POA: Diagnosis present

## 2015-04-14 DIAGNOSIS — E876 Hypokalemia: Secondary | ICD-10-CM

## 2015-04-14 DIAGNOSIS — E875 Hyperkalemia: Secondary | ICD-10-CM

## 2015-04-14 LAB — COMPREHENSIVE METABOLIC PANEL
ALT: 35 U/L (ref 14–54)
AST: 71 U/L — ABNORMAL HIGH (ref 15–41)
Albumin: 2.6 g/dL — ABNORMAL LOW (ref 3.5–5.0)
Alkaline Phosphatase: 64 U/L (ref 38–126)
Anion gap: 7 (ref 5–15)
BUN: 33 mg/dL — ABNORMAL HIGH (ref 6–20)
CO2: 24 mmol/L (ref 22–32)
Calcium: 8.1 mg/dL — ABNORMAL LOW (ref 8.9–10.3)
Chloride: 108 mmol/L (ref 101–111)
Creatinine, Ser: 0.94 mg/dL (ref 0.44–1.00)
GFR calc Af Amer: >60 mL/min (ref >60)
GFR calc non Af Amer: 57 mL/min — ABNORMAL LOW (ref >60)
Glucose, Bld: 132 mg/dL — ABNORMAL HIGH (ref 65–99)
Potassium: 3.4 mmol/L — ABNORMAL LOW (ref 3.5–5.1)
Sodium: 139 mmol/L (ref 135–145)
Total Bilirubin: 0.7 mg/dL (ref 0.3–1.2)
Total Protein: 5.4 g/dL — ABNORMAL LOW (ref 6.5–8.1)

## 2015-04-14 LAB — TROPONIN I
Troponin I: 1.28 ng/mL (ref <0.031)
Troponin I: 3.37 ng/mL (ref <0.031)
Troponin I: 4.61 ng/mL (ref <0.031)

## 2015-04-14 LAB — CBC
HCT: 37.8 % (ref 36.0–46.0)
Hemoglobin: 12.9 g/dL (ref 12.0–15.0)
MCH: 31.5 pg (ref 26.0–34.0)
MCHC: 34.1 g/dL (ref 30.0–36.0)
MCV: 92.2 fL (ref 78.0–100.0)
Platelets: 163 10*3/uL (ref 150–400)
RBC: 4.1 MIL/uL (ref 3.87–5.11)
RDW: 14.6 % (ref 11.5–15.5)
WBC: 10.6 10*3/uL — ABNORMAL HIGH (ref 4.0–10.5)

## 2015-04-14 LAB — URINE CULTURE: Culture: NO GROWTH

## 2015-04-14 LAB — CK: Total CK: 1542 U/L — ABNORMAL HIGH (ref 38–234)

## 2015-04-14 LAB — MRSA PCR SCREENING: MRSA by PCR: NEGATIVE

## 2015-04-14 LAB — TSH: TSH: 1.91 u[IU]/mL (ref 0.350–4.500)

## 2015-04-14 LAB — POTASSIUM: Potassium: 3.8 mmol/L (ref 3.5–5.1)

## 2015-04-14 MED ORDER — LIP MEDEX EX OINT
TOPICAL_OINTMENT | CUTANEOUS | Status: AC
  Administered 2015-04-14: 07:00:00
  Filled 2015-04-14: qty 7

## 2015-04-14 MED ORDER — TRAMADOL HCL 50 MG PO TABS
50.0000 mg | ORAL_TABLET | ORAL | Status: DC | PRN
  Administered 2015-04-15: 100 mg via ORAL
  Administered 2015-04-16 (×2): 50 mg via ORAL
  Filled 2015-04-14 (×2): qty 1
  Filled 2015-04-14: qty 2

## 2015-04-14 MED ORDER — ASPIRIN EC 81 MG PO TBEC
81.0000 mg | DELAYED_RELEASE_TABLET | Freq: Every day | ORAL | Status: DC
  Administered 2015-04-15 – 2015-04-18 (×4): 81 mg via ORAL
  Filled 2015-04-14 (×4): qty 1

## 2015-04-14 MED ORDER — ASPIRIN 325 MG PO TABS
325.0000 mg | ORAL_TABLET | Freq: Once | ORAL | Status: AC
  Administered 2015-04-14: 325 mg via ORAL
  Filled 2015-04-14: qty 1

## 2015-04-14 MED ORDER — FAMOTIDINE 20 MG PO TABS
20.0000 mg | ORAL_TABLET | Freq: Every day | ORAL | Status: DC
  Administered 2015-04-14 – 2015-04-18 (×5): 20 mg via ORAL
  Filled 2015-04-14 (×5): qty 1

## 2015-04-14 MED ORDER — ALLOPURINOL 300 MG PO TABS
300.0000 mg | ORAL_TABLET | Freq: Every day | ORAL | Status: DC
  Administered 2015-04-14 – 2015-04-18 (×5): 300 mg via ORAL
  Filled 2015-04-14 (×3): qty 3
  Filled 2015-04-14 (×2): qty 1

## 2015-04-14 MED ORDER — TRAZODONE HCL 50 MG PO TABS
25.0000 mg | ORAL_TABLET | Freq: Every evening | ORAL | Status: DC | PRN
  Administered 2015-04-14: 50 mg via ORAL
  Filled 2015-04-14 (×2): qty 1

## 2015-04-14 MED ORDER — NORTRIPTYLINE HCL 25 MG PO CAPS
25.0000 mg | ORAL_CAPSULE | Freq: Every day | ORAL | Status: DC
  Administered 2015-04-14 – 2015-04-17 (×4): 25 mg via ORAL
  Filled 2015-04-14 (×6): qty 1

## 2015-04-14 MED ORDER — SODIUM CHLORIDE 0.9 % IV SOLN
INTRAVENOUS | Status: DC
  Administered 2015-04-14 – 2015-04-15 (×2): via INTRAVENOUS

## 2015-04-14 MED ORDER — ONDANSETRON HCL 4 MG PO TABS: 8.0000 mg | ORAL_TABLET | Freq: Three times a day (TID) | ORAL | Status: DC | PRN

## 2015-04-14 MED ORDER — METOPROLOL TARTRATE 25 MG PO TABS
12.5000 mg | ORAL_TABLET | Freq: Two times a day (BID) | ORAL | Status: DC
  Administered 2015-04-14 – 2015-04-18 (×9): 12.5 mg via ORAL
  Filled 2015-04-14 (×9): qty 1

## 2015-04-14 NOTE — Progress Notes (Signed)
*  PRELIMINARY RESULTS* Echocardiogram 2D Echocardiogram has been performed.  Leavy Cella 04/14/2015, 1:15 PM

## 2015-04-14 NOTE — Consult Note (Signed)
Cardiology Consultation Note  Patient ID: Carrie Smith, MRN: [id number], DOB/AGE: Oct 12, 1935 79 y.o. Admit date: 04/13/2015   Date of Consult: 04/14/2015 Primary Physician: No primary care provider on file. Primary Cardiologist: New to Dr. Irish Lack  Chief Complaint: fall, confusion Reason for Consultation: ?troponin (peak 3.37 thus far) - patient admitted with acute PE/hypoxia  HPI: Carrie Smith is a 78 y/o F with moderate dementia, gout, HLD, celiac disease (versus ischemic colitis versus microscopic colitis), remote GIB in setting of NSAIDs, prior stroke, obesity who presented to [facility] yesterday with fall, confusion, and hypoxia.  The patient was last believed to be in her usual state of health the evening before last.Yesterday at 1pm, her son (EMT) came to her house and found her lying on the living room floor in feces and urine, confused, and unable to provide any history. They helped her into the bath but she began to report she couldn't breathe. She appeared more confused than usual, breathing heavily, and was globally weak. Upon arrival to the ER, O2 sat 80%, tachycardic, tachypneic. CTA showed bilateral PEs with evidence of right heart strain, as well as mild patchy right-sided atelectasis. CT head showed multiple small prior infarcts, and moderate ventriculomegaly, slightly increased from the prior MRI. TMax 100.1. Labs otherwise notable for lactic acidosis 3.08, leukocytosis WBC 18k, albumin 2.6, hyperkalemia 5.9, AKI BUN/Cr 41/1.28, CK 1210->1542, troponins 0.09->1.28->3.37. Initial Hgb 15->12 after IV hydration. Received 325mg  ASA last night and is on Lovenox now for PE.  She is still slightly confused - her family says this has become more prominent over the last several months, where she can't quite find words or say what she is thinking of. She knows she's in the hospital but when asked the name of the hospital, she begins giving the time and stating random numbers. There is no recent  surgery or bedrest. Her son says she tends to slide out of her chairlift occasionally but other than yesterday had not had any recent falls. Telemetry reveals two episodes of breakthrough tachycardia since overnight.  Past Medical History  Diagnosis Date  . Stroke ([place])   . Back pain   . Hypercholesterolemia   . Dementia   . Hyperlipidemia   . Celiac disease     a. versus ischemic colitis versus microscopic colitis per chart.  . GI bleed     a. remote GIB in setting of NSAIDs for arthritis.  . Arthritis   . Osteoporosis      Surgical History:  Past Surgical History  Procedure Laterality Date  . Back surgery    . Cholecystectomy       Home Meds: Prior to Admission medications   Medication Sig Start Date End Date Taking? Authorizing Provider  allopurinol (ZYLOPRIM) 300 MG tablet TAKE 1 TABLET EVERY DAY TO PREVENT GOUT 03/01/15  Yes Historical Provider, MD  atorvastatin (LIPITOR) 80 MG tablet Take 80 mg by mouth daily. 03/06/15  Yes Historical Provider, MD  cimetidine (TAGAMET) 800 MG tablet Take 800 mg by mouth at bedtime.   Yes Historical Provider, MD  furosemide (LASIX) 40 MG tablet Take 40 mg by mouth daily as needed for fluid.  02/03/15  Yes Historical Provider, MD  metolazone (ZAROXOLYN) 2.5 MG tablet Take 2.5 mg by mouth daily.   Yes Historical Provider, MD  nortriptyline (PAMELOR) 25 MG capsule Take 25 mg by mouth at bedtime.   Yes Historical Provider, MD  ondansetron (ZOFRAN) 8 MG tablet Take 8 mg by mouth every 8 (eight) hours  as needed for nausea or vomiting.   Yes Historical Provider, MD  traMADol (ULTRAM) 50 MG tablet Take 50-100 mg by mouth every 4 (four) hours as needed for moderate pain or severe pain. Max 5 per day 02/12/15  Yes Historical Provider, MD  traZODone (DESYREL) 50 MG tablet Take 25-50 mg by mouth at bedtime as needed for sleep.   Yes Historical Provider, MD    Inpatient Medications:  . allopurinol  300 mg Oral Daily  . [START ON 04/15/2015] aspirin EC  81  mg Oral Daily  . enoxaparin (LOVENOX) injection  85 mg Subcutaneous BID  . famotidine  20 mg Oral Daily  . lip balm      . metoprolol  5 mg Intravenous Once  . nortriptyline  25 mg Oral QHS  . sodium chloride  3 mL Intravenous Q12H   . sodium chloride 125 mL/hr at 04/14/15 0700    Allergies:  Allergies  Allergen Reactions  . Codeine     Social History   Social History  . Marital Status: Divorced    Spouse Name: N/A  . Number of Children: N/A  . Years of Education: N/A   Occupational History  . Not on file.   Social History Main Topics  . Smoking status: Former Smoker -- 25 years  . Smokeless tobacco: Not on file  . Alcohol Use: No  . Drug Use: No  . Sexual Activity: Not on file   Other Topics Concern  . Not on file   Social History Narrative     Family History  Problem Relation Age of Onset  . Diabetes Mother   . Heart disease Mother   . Heart attack Father     heart attack, sudden massive  . Heart disease Sister     heart disease, ?blood clot     Review of Systems: General: negative for chills, fever, night sweats or weight changes.  Cardiovascular: negative for chest pain, edema, orthopnea, palpitations, paroxysmal nocturnal dyspnea, shortness of breath or dyspnea on exertion Dermatological: negative for rash Respiratory: negative for cough or wheezing Urologic: negative for hematuria Abdominal: negative for nausea, vomiting, diarrhea, bright red blood per rectum, melena, or hematemesis Neurologic: negative for visual changes, syncope, or dizziness All other systems reviewed and are otherwise negative except as noted above.  Labs:  Recent Labs  04/13/15 1709 04/13/15 2300 04/14/15 0448  CKTOTAL 1210* 1542*  --   TROPONINI  --  1.28* 3.37*   Lab Results  Component Value Date   WBC 10.6* 04/14/2015   HGB 12.9 04/14/2015   HCT 37.8 04/14/2015   MCV 92.2 04/14/2015   PLT 163 04/14/2015    Recent Labs Lab 04/14/15 0448  NA 139  K 3.4*    CL 108  CO2 24  BUN 33*  CREATININE 0.94  CALCIUM 8.1*  PROT 5.4*  BILITOT 0.7  ALKPHOS 64  ALT 35  AST 71*  GLUCOSE 132*   Radiology/Studies:  Dg Chest 1 View  04/13/2015  CLINICAL DATA:  Found on floor. Concern for chest injury. Initial encounter. EXAM: CHEST 1 VIEW COMPARISON:  None. FINDINGS: There is elevation of the right hemidiaphragm. There is no evidence of focal opacification, pleural effusion or pneumothorax. The cardiomediastinal silhouette is borderline normal in size. No acute osseous abnormalities are seen. There is chronic elevation of the right humeral head, reflecting chronic rotator cuff tear. IMPRESSION: Elevation of the right hemidiaphragm. Lungs remain grossly clear. No displaced rib fracture seen. Electronically Signed  By: Garald Balding M.D.   On: 04/13/2015 18:28   Ct Head Wo Contrast  04/13/2015  CLINICAL DATA:  Fall. Found on floor incontinent of urine and stool. Lightheaded. Initial encounter. EXAM: CT HEAD WITHOUT CONTRAST TECHNIQUE: Contiguous axial images were obtained from the base of the skull through the vertex without intravenous contrast. COMPARISON:  Brain MRI 10/30/2008 FINDINGS: Multiple small infarcts are present in the right cerebellum, generally chronic in appearance although increased in number from the prior MRI. There may be a new small infarct inferiorly in the left cerebellum. There is moderate enlargement of the ventricles which is slightly more prominent than on the prior study. The cerebral sulci are normal in size. A small, chronic left occipital cortical infarct is again seen. Periventricular and subcortical cerebral white matter hypodensities are nonspecific but compatible with moderate chronic small vessel ischemic disease. There is no evidence of acute large territory infarct, intracranial hemorrhage, mass, midline shift, or extra-axial fluid collection. Right frontotemporal scalp swelling is noted. The orbits are unremarkable. Mild debris  is noted in the left sphenoid sinus. The mastoid air cells are clear. No skull fracture is identified. Calcified atherosclerosis is noted at the skull base. IMPRESSION: 1. Right frontotemporal scalp swelling. No skull fracture or intracranial hemorrhage identified. 2. Multiple small infarcts in the cerebellum, increased in number from 2010. These are overall chronic in appearance, however superimposed acute or subacute ischemia is not excluded given the interval change. 3. Chronic left occipital infarct and chronic small vessel ischemic disease in the cerebral white matter. 4. Moderate ventriculomegaly, slightly increased from the prior MRI. This may reflect central predominant cerebral atrophy, however normal pressure hydrocephalus is not excluded in the appropriate clinical setting. Electronically Signed   By: Logan Bores M.D.   On: 04/13/2015 18:17   Ct Angio Chest Pe W/cm &/or Wo Cm  04/13/2015  CLINICAL DATA:  Patient found on floor unresponsive. Desaturation. Initial encounter. EXAM: CT ANGIOGRAPHY CHEST WITH CONTRAST TECHNIQUE: Multidetector CT imaging of the chest was performed using the standard protocol during bolus administration of intravenous contrast. Multiplanar CT image reconstructions and MIPs were obtained to evaluate the vascular anatomy. CONTRAST:  70mL OMNIPAQUE IOHEXOL 350 MG/ML SOLN COMPARISON:  Chest radiograph performed earlier today at 6:12 p.m. FINDINGS: Segmental pulmonary emboli are noted within pulmonary arteries to the right lower lobe and left upper lobe, and smaller emboli within the pulmonary arteries to the right upper lobe and left lower lobe. The RV/LV ratio of 0.96 corresponds to concern for submassive pulmonary embolus. Mild patchy right-sided atelectasis is noted. The lungs are otherwise grossly clear. There is no evidence of pleural effusion or pneumothorax. No masses are identified; no abnormal focal contrast enhancement is seen. Scattered mediastinal nodes remain  normal in size. No pericardial effusion is identified. Scattered calcification is noted along the thoracic aorta. The great vessels are grossly unremarkable. No axillary lymphadenopathy is seen. The thyroid gland is unremarkable in appearance. The visualized portions of the liver and spleen are unremarkable. No acute osseous abnormalities are seen. Review of the MIP images confirms the above findings. IMPRESSION: 1. Segmental pulmonary emboli within pulmonary arteries to the right lower lobe and left upper lobe, and smaller emboli within the pulmonary arteries to the right upper lobe and left lower lobe. CT evidence of right heart strain (RV/LV Ratio = 0.96) consistent with at least submassive (intermediate risk) PE. The presence of right heart strain has been associated with an increased risk of morbidity and mortality. Please activate Code  PE by paging [phone]. 2. Mild patchy right-sided atelectasis noted. Lungs otherwise clear. Critical Value/emergent results were called by telephone at the time of interpretation on 04/13/2015 at 8:31 pm to Dr. Sherwood Gambler, who verbally acknowledged these results. Electronically Signed   By: Garald Balding M.D.   On: 04/13/2015 20:31   Dg Hip Unilat With Pelvis 2-3 Views Left  04/13/2015  CLINICAL DATA:  Status post fall. Concern for left hip injury. Initial encounter. EXAM: DG HIP (WITH OR WITHOUT PELVIS) 2-3V LEFT COMPARISON:  None. FINDINGS: There is no evidence of fracture or dislocation. Both femoral heads are seated normally within their respective acetabula. The proximal left femur appears intact. Mild sclerotic change is noted at the left sacroiliac joint. The patient is status post lumbar spinal fusion at L4-L5. The visualized bowel gas pattern is grossly unremarkable in appearance. IMPRESSION: No evidence of fracture or dislocation. Electronically Signed   By: Garald Balding M.D.   On: 04/13/2015 18:29    Wt Readings from Last 3 Encounters:  04/14/15 190  lb 14.7 oz (86.6 kg)  06/25/08 182 lb 8 oz (82.781 kg)    EKG: Sinus tach 122bpm, occ PACs, nonspecific ST-T changes   Physical Exam: Blood pressure 149/68, pulse 94, temperature 99.6 F (37.6 C), temperature source Axillary, resp. rate 28, height 4' 11.06" (1.5 m), weight 190 lb 14.7 oz (86.6 kg), SpO2 93 %. General: Well developed obese WF in no acute distress. Head: Normocephalic, atraumatic, sclera non-icteric, no xanthomas, nares are without discharge.  Neck: JVD not elevated. Lungs: Clear bilaterally to auscultation without wheezes, rales, or rhonchi. Breathing is unlabored. Heart: RRR slightly elevated rate with S1 S2. No murmurs, rubs, or gallops appreciated. Abdomen: Soft, non-tender, non-distended with normoactive bowel sounds. No hepatomegaly. No rebound/guarding. No obvious abdominal masses. Msk:  Strength and tone appear normal for age. Extremities: No clubbing or cyanosis. Soft pedal edema present. Distal pedal pulses are 2+ and equal bilaterally. Neuro: Alert and oriented to self, hospital. Reads the date from the wall. Tangential. Follows commands. Psych:  Pleasant affect.    Assessment and Plan:   1. Acute bilateral pulmonary embolism - now on Lovenox. LE duplex pending.  2. Fall, confusion/acute encephalopathy, low grade temperature elevation - ? r/t #1 - further per IM.  3. Elevated troponin - acute presentation can be explained by PE. Elevation in troponin may be 2/2 demand ischemia in the setting of PE +/- rhabdomyolysis. One more set ordered to trend level. Agree with 2D echo. Will continue low dose daily aspirin starting tomorrow. Check lipids/A1C to risk stratify. Will discuss further evaluation with MD.  4. AKI/hyperkalemia - Cr improved with IV hydration. Will defer lyte replacement to IM.  5. Tachycardia - majority of tele shows sinus tach, but there were two episodes of short breakthrough narrow-complex tachycardia that appears irregular at times - ? PAT vs  PAF. Will have cardiology MD review telemetry and weigh in. For now will start low dose BB and check TSH.  6. LE edema - based on med list this appears chronic (on Lasix/metolazone at home). BNP only 171 on arrival. Given that Hgb went from 15->12 and Cr normalized with IV hydration, suspect she was on the dry side on admission. Diuretics on hold while being rehydrated.  Signed, Charlie Pitter PA-C 04/14/2015, 8:41 AM Pager: [phone]  I have examined the patient and reviewed assessment and plan and discussed with patient.  Agree with above as stated.  She has had some MAT.  She will be anticoagulated regardless. Check echo.  I think the troponin is from her PE.  Would not pursue ischemic w/u at this time.   Miro Balderson S.

## 2015-04-14 NOTE — Care Management Note (Signed)
Case Management Note  Patient Details  Name: Carrie Smith MRN: [id number] Date of Birth: 30-Oct-1935  Subjective/Objective:          ams with sirs          Action/Plan:Date: April 14, 2015 Chart reviewed for concurrent status and case management needs. Will continue to follow patient for changes and needs: Carrie Harman, RN, BSN, [place]   [phone]   Expected Discharge Date:                  Expected Discharge Plan:  Home/Self Care  In-House Referral:  NA  Discharge planning Services  CM Consult  Post Acute Care Choice:  NA Choice offered to:  NA  DME Arranged:    DME Agency:     HH Arranged:    HH Agency:     Status of Service:  In process, will continue to follow  Medicare Important Message Given:    Date Medicare IM Given:    Medicare IM give by:    Date Additional Medicare IM Given:    Additional Medicare Important Message give by:     If discussed at [place] of Stay Meetings, dates discussed:    Additional Comments:  Leeroy Cha, RN 04/14/2015, 10:14 AM

## 2015-04-14 NOTE — Progress Notes (Signed)
ANTICOAGULATION CONSULT NOTE - Initial Consult  Pharmacy Consult for Enoxaparin Indication: pulmonary embolus  Allergies  Allergen Reactions  . Codeine     Patient Measurements: Height: 4' 11.06" (150 cm) Weight: 196 lb 13.9 oz (89.3 kg) IBW/kg (Calculated) : 43.33 Heparin Dosing Weight:   Vital Signs: Temp: 100.1 F (37.8 C) (11/14 0347) Temp Source: Axillary (11/14 0347) BP: 141/66 mmHg (11/14 0500) Pulse Rate: 104 (11/14 0500)  Labs:  Recent Labs  04/13/15 1709 04/13/15 2300 04/14/15 0448  HGB 15.9*  --  12.9  HCT 46.1*  --  37.8  PLT 235  --  163  LABPROT 13.3  --   --   INR 0.99  --   --   CREATININE 1.28*  --  0.94  CKTOTAL 1210* 1542*  --   TROPONINI  --  1.28* 3.37*    Estimated Creatinine Clearance: 48.8 mL/min (by C-G formula based on Cr of 0.94).   Medical History: Past Medical History  Diagnosis Date  . Stroke ([place])   . Back pain   . Hypercholesterolemia     Medications:  Scheduled:  . enoxaparin (LOVENOX) injection  85 mg Subcutaneous BID  . metoprolol  5 mg Intravenous Once  . sodium chloride  3 mL Intravenous Q12H    Assessment: Patient with bilateral PE.    Goal of Therapy:  Anti-Xa level 0.6-1 units/ml 4hrs after LMWH dose given Monitor platelets by anticoagulation protocol: Yes   Plan:  Enoxaparin 85mg  sq q12hr  Tyler Deis, Shea Stakes Crowford 04/14/2015,5:53 AM

## 2015-04-14 NOTE — Progress Notes (Signed)
CRITICAL VALUE ALERT  Critical value received:  Troponin 1.28  Date of notification:  04/14/15  Time of notification:  0025  Critical value read back:Yes.    Nurse who received alert:  Duard Larsen, RN  MD notified (1st page):  Dr. Loleta Books  Time of first page:  0026  Responding MD: Dr. Loleta Books  Time MD responded:  [phone]

## 2015-04-14 NOTE — Progress Notes (Signed)
VASCULAR LAB PRELIMINARY  PRELIMINARY  PRELIMINARY  PRELIMINARY  Bilateral lower extremity venous duplex completed.    Preliminary report:  Bilateral:  No evidence of DVT, superficial thrombosis, or Baker's Cyst.   Sandhya Denherder, RVS 04/14/2015, 9:34 AM

## 2015-04-14 NOTE — Progress Notes (Signed)
Biancca Bebber Ucci [id number] DOB: 1935-07-31 DOA: 04/13/2015 PCP: No primary care provider on file.  Brief narrative: 79 y/o ? L4-L5 grade 1 spondylolisthesis status post decompression and laminectomy 2005 Prior nonsteroidal gastritis 2005 Celiac disease  remote CVA 1887 HLD Bipolar and reported dementia Former smoker Gout ? Heart failure on Zaroxolyn and Lasix  Admitted with LOC, found unconscious in feces and urine and came to ED where found to have pulmonary embolism on CT chest Initial white count 18 as well as low-grade temperature, tachycardia 100 range and hypertensive Troponin bump noted from 1.2-3.3 and still trending, lactic acid 3.08, elevated BNP CK total and mildly elevated AST 120 Placed on empiric Lovenox   Past medical history-As per Problem list Chart reviewed as below- Reviewed  Consultants: Cardiology   Procedures: CT chest   Antibiotics: Azithromycin 11/14 Ceftriaxone 11/14    Subjective  Alert but not completly oriented However recognizes son and pleasant No overt pain No cp No cough no cold as per son's   Objective    Interim History:   Telemetry: Sinus tach   Objective: Filed Vitals:   04/14/15 0400 04/14/15 0500 04/14/15 0600 04/14/15 0700  BP: 137/60 141/66 136/62 149/68  Pulse: 103 104 101 94  Temp:      TempSrc:      Resp: 24 27 24 28   Height:      Weight:   86.6 kg (190 lb 14.7 oz)   SpO2: 93% 93% 94% 93%    Intake/Output Summary (Last 24 hours) at 04/14/15 0723 Last data filed at 04/14/15 0600  Gross per 24 hour  Intake 1656.25 ml  Output    825 ml  Net 831.25 ml    Exam:  General: eomi ncat Cardiovascular: s1 s 2 RRR, no M, no carotid bruit Respiratory: clear no added sound Abdomen: soft, slight tender in LLQ Skin tender in L calf region-legs have 2+ edema Neuro intact  Data Reviewed: Basic Metabolic Panel:  Recent Labs Lab 04/13/15 1709 04/13/15 2300 04/14/15 0448  NA 138  --  139  K 5.9* 3.8  3.4*  CL 102  --  108  CO2 25  --  24  GLUCOSE 157*  --  132*  BUN 41*  --  33*  CREATININE 1.28*  --  0.94  CALCIUM 9.1  --  8.1*   Liver Function Tests:  Recent Labs Lab 04/13/15 1709 04/14/15 0448  AST 120* 71*  ALT 50 35  ALKPHOS 103 64  BILITOT 2.3* 0.7  PROT 7.1 5.4*  ALBUMIN 3.3* 2.6*   No results for input(s): LIPASE, AMYLASE in the last 168 hours. No results for input(s): AMMONIA in the last 168 hours. CBC:  Recent Labs Lab 04/13/15 1709 04/14/15 0448  WBC 18.0* 10.6*  NEUTROABS 15.5*  --   HGB 15.9* 12.9  HCT 46.1* 37.8  MCV 93.1 92.2  PLT 235 163   Cardiac Enzymes:  Recent Labs Lab 04/13/15 1709 04/13/15 2300 04/14/15 0448  CKTOTAL 1210* 1542*  --   TROPONINI  --  1.28* 3.37*   BNP: Invalid input(s): POCBNP CBG:  Recent Labs Lab 04/13/15 1615  GLUCAP 163*    Recent Results (from the past 240 hour(s))  MRSA PCR Screening     Status: None   Collection Time: 04/13/15 10:05 PM  Result Value Ref Range Status   MRSA by PCR NEGATIVE NEGATIVE Final    Comment:        The GeneXpert MRSA Assay (FDA approved for  NASAL specimens only), is one component of a comprehensive MRSA colonization surveillance program. It is not intended to diagnose MRSA infection nor to guide or monitor treatment for MRSA infections.      Studies:              All Imaging reviewed and is as per above notation   Scheduled Meds: . allopurinol  300 mg Oral Daily  . [START ON 04/15/2015] aspirin EC  81 mg Oral Daily  . enoxaparin (LOVENOX) injection  85 mg Subcutaneous BID  . famotidine  20 mg Oral Daily  . lip balm      . metoprolol  5 mg Intravenous Once  . nortriptyline  25 mg Oral QHS  . sodium chloride  3 mL Intravenous Q12H   Continuous Infusions: . sodium chloride 125 mL/hr at 04/14/15 0600     Assessment/Plan:  1. Acute PE-hemodynamically stable, intermediate risk PE with RV/LV=0.96-Possible LLE DVT as well.  Usually use IV heparin-however IV in  ED was positional.  Echo cardiogram to determine exact RV dysfunction is pending and has been orderedFeel reasonable to monitor in SDU currently with Lovenox as no significant hypoxia, hypotension nor other concerns-this is supported by [place] 2010;[id number].  We will transition in ~ 48 hours to NOAC.  Continue Oxygen and check desat screen in 24-48 hrs 2. Elevated troponin, BNP and .  Cardiology is seeing the patient however the rise in CE's is probably 2/2 to submassive PE-cyle troponin  We will await Echo to determine if there is any other issue at hand.  Would continue ASA 81 mg daily in addition to loenox for right now.  She has a remote h/o gastritis 3. Remote CVA 1987-stable currently 4. HLD re-start Atorvastatin 80 mg as OP 5.  LE swelling-unsure how chronic the LE swelling is-family says this has been going on for a while and the patient is on Lasix 40 at home as well as Zaroxolyn which have been held.  Weight on admission 190.  Monitor 6. CKD stgIII on admission-had AKI on admit-now stage II-continue saline lower rate 75 cc/hr 7. Leukocytosis-? 2/2 to PE.  D/c Abx azithromycin and ceftriaxone 8. Bipolar and dementia-continue Nortriptyline 25, trazadone 25-50 qhs  Code Status: Full Family Communication:  Long discussion expalaining POC to son's-expect resolution but might need SNF.  Therapy to work with her -NO CONTRAINDICATION as is on Lovenox Disposition Plan: likely will need Snf in ~48-72 hours  Verneita Griffes, MD  [facility] Pager [phone] 04/14/2015, 7:23 AM    LOS: 1 day

## 2015-04-15 DIAGNOSIS — I2699 Other pulmonary embolism without acute cor pulmonale: Principal | ICD-10-CM

## 2015-04-15 LAB — CBC
HCT: 36 % (ref 36.0–46.0)
Hemoglobin: 12 g/dL (ref 12.0–15.0)
MCH: 31.6 pg (ref 26.0–34.0)
MCHC: 33.3 g/dL (ref 30.0–36.0)
MCV: 94.7 fL (ref 78.0–100.0)
Platelets: 156 10*3/uL (ref 150–400)
RBC: 3.8 MIL/uL — ABNORMAL LOW (ref 3.87–5.11)
RDW: 14.8 % (ref 11.5–15.5)
WBC: 8.2 10*3/uL (ref 4.0–10.5)

## 2015-04-15 LAB — COMPREHENSIVE METABOLIC PANEL
ALT: 35 U/L (ref 14–54)
AST: 59 U/L — ABNORMAL HIGH (ref 15–41)
Albumin: 2.5 g/dL — ABNORMAL LOW (ref 3.5–5.0)
Alkaline Phosphatase: 64 U/L (ref 38–126)
Anion gap: 6 (ref 5–15)
BUN: 25 mg/dL — ABNORMAL HIGH (ref 6–20)
CO2: 23 mmol/L (ref 22–32)
Calcium: 8.1 mg/dL — ABNORMAL LOW (ref 8.9–10.3)
Chloride: 112 mmol/L — ABNORMAL HIGH (ref 101–111)
Creatinine, Ser: 0.81 mg/dL (ref 0.44–1.00)
GFR calc Af Amer: >60 mL/min (ref >60)
GFR calc non Af Amer: >60 mL/min (ref >60)
Glucose, Bld: 114 mg/dL — ABNORMAL HIGH (ref 65–99)
Potassium: 3.1 mmol/L — ABNORMAL LOW (ref 3.5–5.1)
Sodium: 141 mmol/L (ref 135–145)
Total Bilirubin: 0.6 mg/dL (ref 0.3–1.2)
Total Protein: 5.2 g/dL — ABNORMAL LOW (ref 6.5–8.1)

## 2015-04-15 LAB — PROTIME-INR
INR: 1.08 (ref 0.00–1.49)
Prothrombin Time: 14.2 seconds (ref 11.6–15.2)

## 2015-04-15 LAB — HEMOGLOBIN A1C
Hgb A1c MFr Bld: 6.4 % — ABNORMAL HIGH (ref 4.8–5.6)
Mean Plasma Glucose: 137 mg/dL

## 2015-04-15 LAB — LIPID PANEL
Cholesterol: 160 mg/dL (ref 0–200)
HDL: 39 mg/dL — ABNORMAL LOW (ref >40)
LDL Cholesterol: 104 mg/dL — ABNORMAL HIGH (ref 0–99)
Total CHOL/HDL Ratio: 4.1 RATIO
Triglycerides: 85 mg/dL (ref <150)
VLDL: 17 mg/dL (ref 0–40)

## 2015-04-15 MED ORDER — POTASSIUM CHLORIDE CRYS ER 20 MEQ PO TBCR
40.0000 meq | EXTENDED_RELEASE_TABLET | Freq: Every day | ORAL | Status: DC
  Administered 2015-04-15 – 2015-04-18 (×4): 40 meq via ORAL
  Filled 2015-04-15 (×4): qty 2

## 2015-04-15 MED ORDER — RIVAROXABAN 15 MG PO TABS
15.0000 mg | ORAL_TABLET | Freq: Two times a day (BID) | ORAL | Status: DC
  Administered 2015-04-15 – 2015-04-18 (×7): 15 mg via ORAL
  Filled 2015-04-15 (×7): qty 1

## 2015-04-15 MED ORDER — FUROSEMIDE 40 MG PO TABS: 40.0000 mg | ORAL_TABLET | Freq: Every day | ORAL | Status: DC | PRN

## 2015-04-15 MED ORDER — ENOXAPARIN SODIUM 100 MG/ML ~~LOC~~ SOLN
90.0000 mg | Freq: Two times a day (BID) | SUBCUTANEOUS | Status: DC
  Administered 2015-04-15: 90 mg via SUBCUTANEOUS
  Filled 2015-04-15 (×2): qty 1

## 2015-04-15 NOTE — Progress Notes (Signed)
Carrie Smith [id number] DOB: 1935-08-22 DOA: 04/13/2015 PCP: No primary care provider on file.  Brief narrative: 79 y/o ? L4-L5 grade 1 spondylolisthesis status post decompression and laminectomy 2005 Prior nonsteroidal gastritis 2005 Celiac disease  remote CVA 1887 HLD Bipolar and reported dementia Former smoker Gout ? Heart failure on Zaroxolyn and Lasix  Admitted with LOC, found unconscious in feces and urine and came to ED where found to have pulmonary embolism on CT chest Initial white count 18 as well as low-grade temperature, tachycardia 100 range and hypertensive Troponin bump noted from 1.2-3.3 and still trending, lactic acid 3.08, elevated BNP CK total and mildly elevated AST 120 Placed on empiric Lovenox and demonstrated hemodynamic stability  therfore trasnferred out of SDU 11/15   Past medical history-As per Problem list Chart reviewed as below- Reviewed  Consultants: Cardiology   Procedures: CT chest   Antibiotics: Azithromycin 11/14 Ceftriaxone 11/14    Subjective   Little more confused No other coincerns per RN No dark or tarry stool Eating drinking very well No reports of CP   Objective    Interim History:   Telemetry: Sinus tach   Objective: Filed Vitals:   04/15/15 0755 04/15/15 0800 04/15/15 0900 04/15/15 0940  BP:  150/59 151/56   Pulse:  81 90 97  Temp: 98.1 F (36.7 C)     TempSrc: Oral     Resp:  20 25 29   Height:      Weight:      SpO2:  94% 95% 92%    Intake/Output Summary (Last 24 hours) at 04/15/15 0950 Last data filed at 04/15/15 0939  Gross per 24 hour  Intake   1443 ml  Output   1825 ml  Net   -382 ml    Exam:  General: eomi ncat Cardiovascular: s1 s 2 RRR, no M, no carotid bruit Respiratory: clear no added sound Abdomen: soft, slight tender in LLQ Skin tender in L calf region-legs have 2+ edema Neuro intact  Data Reviewed: Basic Metabolic Panel:  Recent Labs Lab 04/13/15 1709   04/14/15 0448 04/15/15 0356  NA 138  --  139 141  K 5.9*  < > 3.4* 3.1*  CL 102  --  108 112*  CO2 25  --  24 23  GLUCOSE 157*  --  132* 114*  BUN 41*  --  33* 25*  CREATININE 1.28*  --  0.94 0.81  CALCIUM 9.1  --  8.1* 8.1*  < > = values in this interval not displayed. Liver Function Tests:  Recent Labs Lab 04/13/15 1709 04/14/15 0448 04/15/15 0356  AST 120* 71* 59*  ALT 50 35 35  ALKPHOS 103 64 64  BILITOT 2.3* 0.7 0.6  PROT 7.1 5.4* 5.2*  ALBUMIN 3.3* 2.6* 2.5*   No results for input(s): LIPASE, AMYLASE in the last 168 hours. No results for input(s): AMMONIA in the last 168 hours. CBC:  Recent Labs Lab 04/13/15 1709 04/14/15 0448 04/15/15 0356  WBC 18.0* 10.6* 8.2  NEUTROABS 15.5*  --   --   HGB 15.9* 12.9 12.0  HCT 46.1* 37.8 36.0  MCV 93.1 92.2 94.7  PLT 235 163 156   Cardiac Enzymes:  Recent Labs Lab 04/13/15 1709 04/13/15 2300 04/14/15 0448 04/14/15 1030  CKTOTAL 1210* 1542*  --   --   TROPONINI  --  1.28* 3.37* 4.61*   BNP: Invalid input(s): POCBNP CBG:  Recent Labs Lab 04/13/15 1615  GLUCAP 163*    Recent  Results (from the past 240 hour(s))  Urine culture     Status: None   Collection Time: 04/13/15  4:30 PM  Result Value Ref Range Status   Specimen Description URINE, CATHETERIZED  Final   Special Requests NONE  Final   Culture   Final    NO GROWTH 1 DAY Performed at [facility]    Report Status 04/14/2015 FINAL  Final  Blood culture (routine x 2)     Status: None (Preliminary result)   Collection Time: 04/13/15  5:20 PM  Result Value Ref Range Status   Specimen Description LEFT ANTECUBITAL  Final   Special Requests BOTTLES DRAWN AEROBIC AND ANAEROBIC 5CC  Final   Culture   Final    NO GROWTH < 24 HOURS Performed at [facility]    Report Status PENDING  Incomplete  MRSA PCR Screening     Status: None   Collection Time: 04/13/15 10:05 PM  Result Value Ref Range Status   MRSA by PCR NEGATIVE NEGATIVE Final     Comment:        The GeneXpert MRSA Assay (FDA approved for NASAL specimens only), is one component of a comprehensive MRSA colonization surveillance program. It is not intended to diagnose MRSA infection nor to guide or monitor treatment for MRSA infections.      Studies:              All Imaging reviewed and is as per above notation   Scheduled Meds: . allopurinol  300 mg Oral Daily  . aspirin EC  81 mg Oral Daily  . famotidine  20 mg Oral Daily  . metoprolol tartrate  12.5 mg Oral BID  . nortriptyline  25 mg Oral QHS  . potassium chloride  40 mEq Oral Daily  . sodium chloride  3 mL Intravenous Q12H   Continuous Infusions:     Assessment/Plan:  1. Acute PE-hemodynamically stable, intermediate risk PE with RV/LV=0.96- LLE DVT ruled out 11/14  2D TT Echo 11/15 confirms mild Pulm Htn + Grade 2 DD.  Transfer out SDU.  Lovenox-->Xarelto as no significant hypoxia, hypotension nor other concerns  Continue Oxygen and check desat screen. 2. Elevated troponin, BNP and CK.  Cardiology is seeing the patient however the rise in CE's is probably 2/2 to submassive PE-cyle troponin.  Would continue ASA 81 mg daily in addition to loenox for right now.  She has a remote h/o gastritis 3. MAT? 4. Remote CVA 1987-stable currently 5. HLD re-start Atorvastatin 80 mg as OP 6.  chronic Diastolic HF-at baseline has LE swelling- Lasix 40 at home as well as Zaroxolyn which have been held.   Saline lock IVf 75 cc/hr today.  Weight on admission 190, now 198? Asked nursing to re-check  Monitor i/o's.  Restart lasix 40 on 11/16 7. Hypokalemia-replace orally with KDUR 40 daily 8. CKD stgIII on admission-had AKI on admit-now stage II-saline lock rate 75 cc/hr 9. Leukocytosis-? 2/2 to PE.  D/c Abx azithromycin and ceftriaxone 10. Bipolar and dementia-confused more today.  Probably 2/2 to continue Nortriptyline 25, trazadone 25-50 qhs  Code Status: Full Family Communication:  Long discussion expalaining  POC to son-expect after therapy eval if hemodynamically stable can d/c in 24-48 hrs  Disposition Plan: likely will need Snf in ~48-72 hours  Verneita Griffes, MD  [facility] Pager [phone] 04/15/2015, 9:50 AM    LOS: 2 days

## 2015-04-15 NOTE — Progress Notes (Addendum)
SUBJECTIVE:  No chest pain or shortness of breath.  She repeatedly called me "Legrand Como", despite me telling her that I was not Legrand Como.  OBJECTIVE:   Vitals:   Filed Vitals:   04/15/15 0940 04/15/15 1040 04/15/15 1218 04/15/15 1353  BP:    180/99  Pulse: 97   93  Temp:   99.5 F (37.5 C)   TempSrc:   Oral   Resp: 29   17  Height:      Weight:  194 lb 14.2 oz (88.4 kg)    SpO2: 92%   92%   I&O's:   Intake/Output Summary (Last 24 hours) at 04/15/15 1545 Last data filed at 04/15/15 1300  Gross per 24 hour  Intake   1458 ml  Output   1825 ml  Net   -367 ml   TELEMETRY: Reviewed telemetry pt in NSR:     PHYSICAL EXAM General: Well developed, well nourished, in no acute distress Head:   Normal cephalic and atramatic  Lungs:   Clear bilaterally to auscultation. Heart:  HRRR S1 S2  No JVD.   Abdomen: abdomen soft and non-tender Msk:  Back normal,  Normal strength and tone for age. Extremities:  No edema.   Neuro: Confused Psych:  Normal affect, responds appropriately but somewhat confused Skin: No rash   LABS: Basic Metabolic Panel:  Recent Labs  04/14/15 0448 04/15/15 0356  NA 139 141  K 3.4* 3.1*  CL 108 112*  CO2 24 23  GLUCOSE 132* 114*  BUN 33* 25*  CREATININE 0.94 0.81  CALCIUM 8.1* 8.1*   Liver Function Tests:  Recent Labs  04/14/15 0448 04/15/15 0356  AST 71* 59*  ALT 35 35  ALKPHOS 64 64  BILITOT 0.7 0.6  PROT 5.4* 5.2*  ALBUMIN 2.6* 2.5*   No results for input(s): LIPASE, AMYLASE in the last 72 hours. CBC:  Recent Labs  04/13/15 1709 04/14/15 0448 04/15/15 0356  WBC 18.0* 10.6* 8.2  NEUTROABS 15.5*  --   --   HGB 15.9* 12.9 12.0  HCT 46.1* 37.8 36.0  MCV 93.1 92.2 94.7  PLT 235 163 156   Cardiac Enzymes:  Recent Labs  04/13/15 1709 04/13/15 2300 04/14/15 0448 04/14/15 1030  CKTOTAL 1210* 1542*  --   --   TROPONINI  --  1.28* 3.37* 4.61*   BNP: Invalid input(s): POCBNP D-Dimer: No results for input(s): DDIMER in  the last 72 hours. Hemoglobin A1C:  Recent Labs  04/14/15 1030  HGBA1C 6.4*   Fasting Lipid Panel:  Recent Labs  04/15/15 0356  CHOL 160  HDL 39*  LDLCALC 104*  TRIG 85  CHOLHDL 4.1   Thyroid Function Tests:  Recent Labs  04/14/15 1030  TSH 1.910   Anemia Panel: No results for input(s): VITAMINB12, FOLATE, FERRITIN, TIBC, IRON, RETICCTPCT in the last 72 hours. Coag Panel:   Lab Results  Component Value Date   INR 1.08 04/15/2015   INR 0.99 04/13/2015    RADIOLOGY: Dg Chest 1 View  04/13/2015  CLINICAL DATA:  Found on floor. Concern for chest injury. Initial encounter. EXAM: CHEST 1 VIEW COMPARISON:  None. FINDINGS: There is elevation of the right hemidiaphragm. There is no evidence of focal opacification, pleural effusion or pneumothorax. The cardiomediastinal silhouette is borderline normal in size. No acute osseous abnormalities are seen. There is chronic elevation of the right humeral head, reflecting chronic rotator cuff tear. IMPRESSION: Elevation of the right hemidiaphragm. Lungs remain grossly clear. No displaced rib fracture  seen. Electronically Signed   By: Garald Balding M.D.   On: 04/13/2015 18:28   Ct Head Wo Contrast  04/13/2015  CLINICAL DATA:  Fall. Found on floor incontinent of urine and stool. Lightheaded. Initial encounter. EXAM: CT HEAD WITHOUT CONTRAST TECHNIQUE: Contiguous axial images were obtained from the base of the skull through the vertex without intravenous contrast. COMPARISON:  Brain MRI 10/30/2008 FINDINGS: Multiple small infarcts are present in the right cerebellum, generally chronic in appearance although increased in number from the prior MRI. There may be a new small infarct inferiorly in the left cerebellum. There is moderate enlargement of the ventricles which is slightly more prominent than on the prior study. The cerebral sulci are normal in size. A small, chronic left occipital cortical infarct is again seen. Periventricular and  subcortical cerebral white matter hypodensities are nonspecific but compatible with moderate chronic small vessel ischemic disease. There is no evidence of acute large territory infarct, intracranial hemorrhage, mass, midline shift, or extra-axial fluid collection. Right frontotemporal scalp swelling is noted. The orbits are unremarkable. Mild debris is noted in the left sphenoid sinus. The mastoid air cells are clear. No skull fracture is identified. Calcified atherosclerosis is noted at the skull base. IMPRESSION: 1. Right frontotemporal scalp swelling. No skull fracture or intracranial hemorrhage identified. 2. Multiple small infarcts in the cerebellum, increased in number from 2010. These are overall chronic in appearance, however superimposed acute or subacute ischemia is not excluded given the interval change. 3. Chronic left occipital infarct and chronic small vessel ischemic disease in the cerebral white matter. 4. Moderate ventriculomegaly, slightly increased from the prior MRI. This may reflect central predominant cerebral atrophy, however normal pressure hydrocephalus is not excluded in the appropriate clinical setting. Electronically Signed   By: Logan Bores M.D.   On: 04/13/2015 18:17   Ct Angio Chest Pe W/cm &/or Wo Cm  04/13/2015  CLINICAL DATA:  Patient found on floor unresponsive. Desaturation. Initial encounter. EXAM: CT ANGIOGRAPHY CHEST WITH CONTRAST TECHNIQUE: Multidetector CT imaging of the chest was performed using the standard protocol during bolus administration of intravenous contrast. Multiplanar CT image reconstructions and MIPs were obtained to evaluate the vascular anatomy. CONTRAST:  32mL OMNIPAQUE IOHEXOL 350 MG/ML SOLN COMPARISON:  Chest radiograph performed earlier today at 6:12 p.m. FINDINGS: Segmental pulmonary emboli are noted within pulmonary arteries to the right lower lobe and left upper lobe, and smaller emboli within the pulmonary arteries to the right upper lobe and  left lower lobe. The RV/LV ratio of 0.96 corresponds to concern for submassive pulmonary embolus. Mild patchy right-sided atelectasis is noted. The lungs are otherwise grossly clear. There is no evidence of pleural effusion or pneumothorax. No masses are identified; no abnormal focal contrast enhancement is seen. Scattered mediastinal nodes remain normal in size. No pericardial effusion is identified. Scattered calcification is noted along the thoracic aorta. The great vessels are grossly unremarkable. No axillary lymphadenopathy is seen. The thyroid gland is unremarkable in appearance. The visualized portions of the liver and spleen are unremarkable. No acute osseous abnormalities are seen. Review of the MIP images confirms the above findings. IMPRESSION: 1. Segmental pulmonary emboli within pulmonary arteries to the right lower lobe and left upper lobe, and smaller emboli within the pulmonary arteries to the right upper lobe and left lower lobe. CT evidence of right heart strain (RV/LV Ratio = 0.96) consistent with at least submassive (intermediate risk) PE. The presence of right heart strain has been associated with an increased risk of morbidity  and mortality. Please activate Code PE by paging [phone]. 2. Mild patchy right-sided atelectasis noted. Lungs otherwise clear. Critical Value/emergent results were called by telephone at the time of interpretation on 04/13/2015 at 8:31 pm to Dr. Sherwood Gambler, who verbally acknowledged these results. Electronically Signed   By: Garald Balding M.D.   On: 04/13/2015 20:31   Dg Hip Unilat With Pelvis 2-3 Views Left  04/13/2015  CLINICAL DATA:  Status post fall. Concern for left hip injury. Initial encounter. EXAM: DG HIP (WITH OR WITHOUT PELVIS) 2-3V LEFT COMPARISON:  None. FINDINGS: There is no evidence of fracture or dislocation. Both femoral heads are seated normally within their respective acetabula. The proximal left femur appears intact. Mild sclerotic  change is noted at the left sacroiliac joint. The patient is status post lumbar spinal fusion at L4-L5. The visualized bowel gas pattern is grossly unremarkable in appearance. IMPRESSION: No evidence of fracture or dislocation. Electronically Signed   By: Garald Balding M.D.   On: 04/13/2015 18:29      ASSESSMENT: Kathyrn Lass:    PE: Normal left and right heart function by echo.  I personally reviewed the images.  Findings discussed with family.  Continue anticoagulation.    Elevated troponin likely related to PE.  No further ischemic w/u given her lack of symptoms.  Baseline dementia may explain the confusion described above.    Will sign off. Please call with questions.    Jettie Booze, MD  04/15/2015  3:45 PM

## 2015-04-15 NOTE — Progress Notes (Addendum)
ANTICOAGULATION CONSULT NOTE - Follow-up  Pharmacy Consult for Enoxaparin to rivaroxaban Indication: pulmonary embolus  Allergies  Allergen Reactions  . Codeine     Patient Measurements: Height: 4' 11.06" (150 cm) Weight: 198 lb 13.7 oz (90.2 kg) IBW/kg (Calculated) : 43.33 Heparin Dosing Weight:   Vital Signs: Temp: 98.1 F (36.7 C) (11/15 0755) Temp Source: Oral (11/15 0755) BP: 124/67 mmHg (11/15 0700) Pulse Rate: 88 (11/15 0700)  Labs:  Recent Labs  04/13/15 1709 04/13/15 2300 04/14/15 0448 04/14/15 1030 04/15/15 0356  HGB 15.9*  --  12.9  --  12.0  HCT 46.1*  --  37.8  --  36.0  PLT 235  --  163  --  156  LABPROT 13.3  --   --   --  14.2  INR 0.99  --   --   --  1.08  CREATININE 1.28*  --  0.94  --  0.81  CKTOTAL 1210* 1542*  --   --   --   TROPONINI  --  1.28* 3.37* 4.61*  --     Estimated Creatinine Clearance: 57 mL/min (by C-G formula based on Cr of 0.81).    Medications:  Scheduled:  . allopurinol  300 mg Oral Daily  . aspirin EC  81 mg Oral Daily  . enoxaparin (LOVENOX) injection  85 mg Subcutaneous BID  . famotidine  20 mg Oral Daily  . metoprolol tartrate  12.5 mg Oral BID  . nortriptyline  25 mg Oral QHS  . sodium chloride  3 mL Intravenous Q12H    Assessment: 79 y/o F with moderate dementia, gout, HLD, celiac disease (versus ischemic colitis versus microscopic colitis), remote GIB in setting of NSAIDs, prior stroke, obesity who presented to [facility] yesterday with fall, confusion, and hypoxia.  CT in ED shows bilateral segmental PE with RHS.  Heparin per pharmacy ordered, ED RN unable to provide est. Weight.  MD told Rx to wait until pt got to floor and weighed.  In the meantime, MD change enox per pharmacy.    Today, 04/15/2015  CBC: Hgb and pltc WNL  Renal: Scr stable with CrCl > 82ml/min  Weight: 90kg; Baseline (at admission was 86kg)  Goal of Therapy:  Anti-Xa level 0.6-1 units/ml 4hrs after LMWH dose given Monitor platelets by  anticoagulation protocol: Yes   Plan:   Adjust enoxaparin to 90mg  SQ q12h  For easier and more accurate dose administration  weight increased this morning from admission.  ? Dehydrated at time of admission.    Doreene Eland, PharmD, BCPS.   Pager: [phone] 04/15/2015 8:12 AM  Addendum: Consult to transition from enoxaparin to rivaroxaban - last dose on enoxaparin at 09:40  Plan:  Start rivaroxaban 15mg  PO BIDWC x 21 days. Give first dose at 20:00 - 2h prior to when next enoxaparin dose was to be given  Start rivaroxaban 20mg  QDWC on day #22  Pharmacist will provide education material, including voucher for free 30-day supply.   Monitor for bleeding  Currently on ASA 81mg  PO daily per cardiology, d/c as appropriate with start of rivaroxaban  Doreene Eland, PharmD, BCPS.   Pager: [phone] 04/15/2015 10:08 AM

## 2015-04-15 NOTE — Discharge Instructions (Signed)
Information on my medicine - XARELTO (rivaroxaban)  This medication education was reviewed with me or my healthcare representative as part of my discharge preparation.  The pharmacist that spoke with me during my hospital stay was:  Clovis Riley, [place]? Xarelto was prescribed to treat blood clots that may have been found in the veins of your legs (deep vein thrombosis) or in your lungs (pulmonary embolism) and to reduce the risk of them occurring again.  What do you need to know about Xarelto? The starting dose is one 15 mg tablet taken TWICE daily with food for the FIRST 21 DAYS then on (enter date) 05/07/2015 the dose is changed to one 20 mg tablet taken ONCE A DAY with your evening meal.  DO NOT stop taking Xarelto without talking to the health care provider who prescribed the medication.  Refill your prescription for 20 mg tablets before you run out.  After discharge, you should have regular check-up appointments with your healthcare provider that is prescribing your Xarelto.  In the future your dose may need to be changed if your kidney function changes by a significant amount.  What do you do if you miss a dose? If you are taking Xarelto TWICE DAILY and you miss a dose, take it as soon as you remember. You may take two 15 mg tablets (total 30 mg) at the same time then resume your regularly scheduled 15 mg twice daily the next day.  If you are taking Xarelto ONCE DAILY and you miss a dose, take it as soon as you remember on the same day then continue your regularly scheduled once daily regimen the next day. Do not take two doses of Xarelto at the same time.   Important Safety Information Xarelto is a blood thinner medicine that can cause bleeding. You should call your healthcare provider right away if you experience any of the following: ? Bleeding from an injury or your nose that does not stop. ? Unusual colored urine (red or dark  brown) or unusual colored stools (red or black). ? Unusual bruising for unknown reasons. ? A serious fall or if you hit your head (even if there is no bleeding).  Some medicines may interact with Xarelto and might increase your risk of bleeding while on Xarelto. To help avoid this, consult your healthcare provider or pharmacist prior to using any new prescription or non-prescription medications, including herbals, vitamins, non-steroidal anti-inflammatory drugs (NSAIDs) and supplements.  This website has more information on Xarelto: [email or web address]

## 2015-04-16 DIAGNOSIS — N179 Acute kidney failure, unspecified: Secondary | ICD-10-CM

## 2015-04-16 DIAGNOSIS — R7989 Other specified abnormal findings of blood chemistry: Secondary | ICD-10-CM

## 2015-04-16 DIAGNOSIS — R740 Nonspecific elevation of levels of transaminase and lactic acid dehydrogenase [LDH]: Secondary | ICD-10-CM

## 2015-04-16 DIAGNOSIS — E669 Obesity, unspecified: Secondary | ICD-10-CM

## 2015-04-16 LAB — BASIC METABOLIC PANEL
Anion gap: 5 (ref 5–15)
BUN: 20 mg/dL (ref 6–20)
CO2: 26 mmol/L (ref 22–32)
Calcium: 8.3 mg/dL — ABNORMAL LOW (ref 8.9–10.3)
Chloride: 108 mmol/L (ref 101–111)
Creatinine, Ser: 0.82 mg/dL (ref 0.44–1.00)
GFR calc Af Amer: >60 mL/min (ref >60)
GFR calc non Af Amer: >60 mL/min (ref >60)
Glucose, Bld: 112 mg/dL — ABNORMAL HIGH (ref 65–99)
Potassium: 3.6 mmol/L (ref 3.5–5.1)
Sodium: 139 mmol/L (ref 135–145)

## 2015-04-16 LAB — CBC
HCT: 36.6 % (ref 36.0–46.0)
Hemoglobin: 12.3 g/dL (ref 12.0–15.0)
MCH: 31.5 pg (ref 26.0–34.0)
MCHC: 33.6 g/dL (ref 30.0–36.0)
MCV: 93.6 fL (ref 78.0–100.0)
Platelets: 138 10*3/uL — ABNORMAL LOW (ref 150–400)
RBC: 3.91 MIL/uL (ref 3.87–5.11)
RDW: 14.6 % (ref 11.5–15.5)
WBC: 9.1 10*3/uL (ref 4.0–10.5)

## 2015-04-16 LAB — TROPONIN I: Troponin I: 0.71 ng/mL (ref <0.031)

## 2015-04-16 MED ORDER — POTASSIUM CHLORIDE CRYS ER 20 MEQ PO TBCR: 40.0000 meq | EXTENDED_RELEASE_TABLET | Freq: Every day | ORAL | Status: DC

## 2015-04-16 MED ORDER — FUROSEMIDE 40 MG PO TABS
40.0000 mg | ORAL_TABLET | Freq: Every day | ORAL | Status: DC
  Administered 2015-04-16 – 2015-04-18 (×3): 40 mg via ORAL
  Filled 2015-04-16 (×3): qty 1

## 2015-04-16 NOTE — Progress Notes (Signed)
Transferred to 1417 via bed.

## 2015-04-16 NOTE — Evaluation (Signed)
Physical Therapy Evaluation Patient Details Name: Carrie Smith MRN: [id number] DOB: Nov 20, 1935 Today's Date: 04/16/2015   History of Present Illness  79 yo female admitted 04/13/15 after son found her down at home, incontinent and confused. Patient has history of dementia, lived alone, ambulated with RW. In ED, the patient was hypoxic to 80%, tachycardic and tachypneic. An ABG showed a mild respiratory alkalosis and hypoxia , and a CT angiogram of the chest showed bilateral segmental PE . Xray negative for hip fractures, CT head showed chronic occipaital infarcts.  Clinical Impression  Pt admitted with above diagnosis. Pt currently with functional limitations due to the deficits listed below (see PT Problem List).  Pt will benefit from skilled PT to increase their independence and safety with mobility to allow discharge to the venue listed below.   Patient pleasantly confused. Requiring 2 person assist.  Unable to stand today  Due to weakness and balance in sitting.     Follow Up Recommendations SNF;Supervision/Assistance - 24 hour    Equipment Recommendations  None recommended by PT    Recommendations for Other Services       Precautions / Restrictions Precautions Precautions: Fall Precaution Comments: monitor HR and sats Restrictions Weight Bearing Restrictions: No      Mobility  Bed Mobility Overal bed mobility: +2 for physical assistance;+ 2 for safety/equipment;Needs Assistance Bed Mobility: Rolling;Supine to Sit;Sit to Supine Rolling: Total assist;+2 for physical assistance;+2 for safety/equipment   Supine to sit: Total assist;+2 for physical assistance;+2 for safety/equipment;HOB elevated Sit to supine: Total assist;+2 for physical assistance;+2 for safety/equipment   General bed mobility comments: patient requires repeated and multimodal cues for  mobility, sats remained > 91% on RA  Transfers                 General transfer comment: unable due to poor  sitting balance   Ambulation/Gait                Stairs            Wheelchair Mobility    Modified Rankin (Stroke Patients Only)       Balance Overall balance assessment: Needs assistance;History of Falls Sitting-balance support: Bilateral upper extremity supported;Feet supported Sitting balance-Leahy Scale: Poor Sitting balance - Comments: sat x 10 minutes with external support. Postural control: Posterior lean;Left lateral lean                                   Pertinent Vitals/Pain Pain Assessment: Faces Faces Pain Scale: Hurts whole lot Pain Location: when moving LLE,  and L shoulder Pain Descriptors / Indicators: Tender;Discomfort;Grimacing;Guarding Pain Intervention(s): Limited activity within patient's tolerance;Monitored during session    Home Living Family/patient expects to be discharged to:: Skilled nursing facility Living Arrangements: Alone                    Prior Function Level of Independence: Independent with assistive device(s)               Hand Dominance        Extremity/Trunk Assessment   Upper Extremity Assessment: RUE deficits/detail;LUE deficits/detail RUE Deficits / Details: raises  arm above shoulder, appears to use it best     LUE Deficits / Details: dysmetria with finger to nose, decreased  shoulder elevation   Lower Extremity Assessment: Generalized weakness;LLE deficits/detail   LLE Deficits / Details: barwely lifts legs, ankle  [facility]  Communication   Communication: No difficulties  Cognition Arousal/Alertness: Awake/alert Behavior During Therapy: WFL for tasks assessed/performed Overall Cognitive Status: Impaired/Different from baseline Area of Impairment: Orientation;Following commands;Awareness;Problem solving Orientation Level: Disoriented to;Time;Situation     Following Commands: Follows one step commands inconsistently   Awareness: Intellectual   General Comments: patient  does enjoy joking     General Comments      Exercises        Assessment/Plan    PT Assessment Patient needs continued PT services  PT Diagnosis Difficulty walking;Generalized weakness;Acute pain;Altered mental status   PT Problem List Decreased strength;Decreased range of motion;Decreased activity tolerance;Decreased balance;Decreased mobility;Decreased cognition;Decreased knowledge of precautions;Decreased safety awareness;Decreased knowledge of use of DME  PT Treatment Interventions DME instruction;Gait training;Therapeutic activities;Functional mobility training;Therapeutic exercise;Patient/family education   PT Goals (Current goals can be found in the Care Plan section) Acute Rehab PT Goals Patient Stated Goal: son agreeable to SNF, prefers [facility] PT Goal Formulation: With family Time For Goal Achievement: 04/30/15 Potential to Achieve Goals: Fair    Frequency Min 3X/week   Barriers to discharge Decreased caregiver support      Co-evaluation               End of Session   Activity Tolerance: Patient tolerated treatment well Patient left: in bed;with call bell/phone within reach;with bed alarm set;with family/visitor present Nurse Communication: Mobility status;Need for lift equipment         Time: [phone] PT Time Calculation (min) (ACUTE ONLY): 44 min   Charges:   PT Evaluation $Initial PT Evaluation Tier I: 1 Procedure PT Treatments $Therapeutic Activity: 23-37 mins   PT G Codes:        [name] 04/16/2015, 10:28 AM [name] PT [phone]

## 2015-04-16 NOTE — Clinical Social Work Note (Signed)
Clinical Social Work Assessment  Patient Details  Name: Carrie Smith MRN: [id number] Date of Birth: 10-11-1935  Date of referral:  04/16/15               Reason for consult:  Facility Placement, Discharge Planning                Permission sought to share information with:  Chartered certified accountant granted to share information::  Yes, Verbal Permission Granted  Name::        Agency::     Relationship::     Contact Information:     Housing/Transportation Living arrangements for the past 2 months:  Single Family Home Source of Information:  Patient, Adult Children Patient Interpreter Needed:  None Criminal Activity/Legal Involvement Pertinent to Current Situation/Hospitalization:  No - Comment as needed Significant Relationships:  Adult Children Lives with:   (SNF placement needed.) Do you feel safe going back to the place where you live?    Need for family participation in patient care:  Yes (Comment)  Care giving concerns:  Pt's care cannot be managed at home following hospital d/c.   Social Worker assessment / plan: Pt hospitalized on 04/13/15 with a PE. Pt has multiple medical issues including falls, and dementia. CSW has been consulted to assist with d/c planning. PN reviewed and CSW met with pt / son [name]. PT recommended SNF placement at d/c. Son agrees with this plan and would like pt to be placed at [facility] ( [facility] ). Pt is willing to consider this option. CSW will initiate SNF search and provide bed offers when available. Employment status:  Retired Forensic scientist:  [employer] PT Recommendations:  [place] / Referral to community resources:  [place]  Patient/Family's Response to care:  Son feels placement is needed and will encourage his mother to accept this plan.  Patient/Family's Understanding of and Emotional Response to Diagnosis, Current Treatment, and Prognosis:  Pt's son is aware of pt's medical  status. HCPOA / DPOA was discussed with [name]. He is aware his mother needs to have capacity in order to complete HCPOA /DPOA.   Emotional Assessment Appearance:  Appears stated age Attitude/Demeanor/Rapport:  Other (cooperative) Affect (typically observed):  Calm, Pleasant Orientation:  Oriented to Self Alcohol / Substance use:  Not Applicable Psych involvement (Current and /or in the community):  No (Comment)  Discharge Needs  Concerns to be addressed:  Discharge Planning Concerns Readmission within the last 30 days:  No Current discharge risk:  None Barriers to Discharge:  No Barriers Identified   Luretha Rued, [place] 04/16/2015, 7:07 PM

## 2015-04-16 NOTE — Progress Notes (Signed)
Carrie Smith [id number] DOB: July 15, 1935 DOA: 04/13/2015 PCP: No primary care provider on file.  Brief narrative: 79 y/o ?L4-L5 grade 1 spondylolisthesis status post decompression and laminectomy 2005 Celiac disease , remote CVA 1887, Bipolar and reported dementia, Former smoker, Heart failure on Zaroxolyn and Lasix. Admitted with LOC, found unconscious in feces and urine and came to ED where found to have pulmonary embolism on CT chest. Troponin bump noted from 1.2-3.3 and still trending, lactic acid 3.08, elevated BNP CK total and mildly elevated AST 120 Placed on SQ Lovenox  Assessment/Plan:  1. Acute PE-hemodynamically stable, intermediate risk PE with RV/LV=0.96- LLE DVT ruled out 11/14 2D TT Echo 11/15 confirms mild Pulm Htn + Grade 2 DD.          -Initially started on full dose subcutaneous Lovenox, transitioned to PO Xarelto          -Clinically improving wil transfer to telemetry bed   2. Elevated troponin, BNP and CK.          -Secondary to demand/strain due to large PE           -We will cycle troponins till trends down, echo with preserved EF and wall motion           -Appreciate cardiology consult  3. Remote CVA 1987-stable currently           -Continue aspirin, statin  4. Chronic Diastolic HF-            -Resume Lasix 40 mg daly, Zaroxolyn on hold  5. Hypokalemia-replaced  6. CKD III           -Stable  7. Bipolar and dementia          -Mentation improved, has periods of sundowning  Code Status: Full Family Communication: No family at bedside today , will call son tomorrow  Dispo: Transfer to tele, SNF in few days if stable   Past medical history-As per Problem list Chart reviewed as below- Reviewed  Consultants: Cardiology   Procedures: CT chest   Antibiotics: Azithromycin 11/14 Ceftriaxone 11/14    Subjective   No complaints, intermittent confusion   Objective     Objective: Filed Vitals:   04/16/15 0720 04/16/15 0800  04/16/15 1114 04/16/15 1200  BP:  148/50 162/80   Pulse: 60 101 81   Temp:  98.6 F (37 C)  99.9 F (37.7 C)  TempSrc:  Oral  Oral  Resp: 22 21 21    Height:      Weight:      SpO2: 95% 97% 94%     Intake/Output Summary (Last 24 hours) at 04/16/15 1506 Last data filed at 04/16/15 1300  Gross per 24 hour  Intake    543 ml  Output   2525 ml  Net  -1982 ml    Exam:  General: AAOx to self only Cardiovascular: s1 s 2 RRR, no M, no carotid bruit Respiratory: CTAB Abdomen: soft, NT, BS present Ext: trace edema Neuro intact  Data Reviewed: Basic Metabolic Panel:  Recent Labs Lab 04/13/15 1709  04/14/15 0448 04/15/15 0356 04/16/15 0340  NA 138  --  139 141 139  K 5.9*  < > 3.4* 3.1* 3.6  CL 102  --  108 112* 108  CO2 25  --  24 23 26   GLUCOSE 157*  --  132* 114* 112*  BUN 41*  --  33* 25* 20  CREATININE 1.28*  --  0.94 0.81 0.82  CALCIUM 9.1  --  8.1* 8.1* 8.3*  < > = values in this interval not displayed. Liver Function Tests:  Recent Labs Lab 04/13/15 1709 04/14/15 0448 04/15/15 0356  AST 120* 71* 59*  ALT 50 35 35  ALKPHOS 103 64 64  BILITOT 2.3* 0.7 0.6  PROT 7.1 5.4* 5.2*  ALBUMIN 3.3* 2.6* 2.5*   No results for input(s): LIPASE, AMYLASE in the last 168 hours. No results for input(s): AMMONIA in the last 168 hours. CBC:  Recent Labs Lab 04/13/15 1709 04/14/15 0448 04/15/15 0356 04/16/15 0340  WBC 18.0* 10.6* 8.2 9.1  NEUTROABS 15.5*  --   --   --   HGB 15.9* 12.9 12.0 12.3  HCT 46.1* 37.8 36.0 36.6  MCV 93.1 92.2 94.7 93.6  PLT 235 163 156 138*   Cardiac Enzymes:  Recent Labs Lab 04/13/15 1709 04/13/15 2300 04/14/15 0448 04/14/15 1030 04/16/15 0839  CKTOTAL 1210* 1542*  --   --   --   TROPONINI  --  1.28* 3.37* 4.61* 0.71*   BNP: Invalid input(s): POCBNP CBG:  Recent Labs Lab 04/13/15 1615  GLUCAP 163*    Recent Results (from the past 240 hour(s))  Urine culture     Status: None   Collection Time: 04/13/15  4:30 PM    Result Value Ref Range Status   Specimen Description URINE, CATHETERIZED  Final   Special Requests NONE  Final   Culture   Final    NO GROWTH 1 DAY Performed at [facility]    Report Status 04/14/2015 FINAL  Final  Blood culture (routine x 2)     Status: None (Preliminary result)   Collection Time: 04/13/15  5:20 PM  Result Value Ref Range Status   Specimen Description LEFT ANTECUBITAL  Final   Special Requests BOTTLES DRAWN AEROBIC AND ANAEROBIC 5CC  Final   Culture   Final    NO GROWTH 3 DAYS Performed at [facility]    Report Status PENDING  Incomplete  Blood culture (routine x 2)     Status: None (Preliminary result)   Collection Time: 04/13/15  5:52 PM  Result Value Ref Range Status   Specimen Description BLOOD LEFT WRIST  Final   Special Requests BOTTLES DRAWN AEROBIC ONLY 5CC  Final   Culture   Final    NO GROWTH 2 DAYS Performed at [facility]    Report Status PENDING  Incomplete  MRSA PCR Screening     Status: None   Collection Time: 04/13/15 10:05 PM  Result Value Ref Range Status   MRSA by PCR NEGATIVE NEGATIVE Final    Comment:        The GeneXpert MRSA Assay (FDA approved for NASAL specimens only), is one component of a comprehensive MRSA colonization surveillance program. It is not intended to diagnose MRSA infection nor to guide or monitor treatment for MRSA infections.      Studies:              All Imaging reviewed and is as per above notation   Scheduled Meds: . allopurinol  300 mg Oral Daily  . aspirin EC  81 mg Oral Daily  . famotidine  20 mg Oral Daily  . furosemide  40 mg Oral Daily  . metoprolol tartrate  12.5 mg Oral BID  . nortriptyline  25 mg Oral QHS  . potassium chloride  40 mEq Oral Daily  . Rivaroxaban  15 mg Oral BID WC  . sodium chloride  3 mL  Intravenous Q12H   Continuous Infusions:     Domenic Polite, MD  [facility] Pager [phone] 04/16/2015, 3:06 PM    LOS: 3 days

## 2015-04-17 LAB — BASIC METABOLIC PANEL
Anion gap: 8 (ref 5–15)
BUN: 17 mg/dL (ref 6–20)
CO2: 28 mmol/L (ref 22–32)
Calcium: 8.3 mg/dL — ABNORMAL LOW (ref 8.9–10.3)
Chloride: 99 mmol/L — ABNORMAL LOW (ref 101–111)
Creatinine, Ser: 0.86 mg/dL (ref 0.44–1.00)
GFR calc Af Amer: >60 mL/min (ref >60)
GFR calc non Af Amer: >60 mL/min (ref >60)
Glucose, Bld: 121 mg/dL — ABNORMAL HIGH (ref 65–99)
Potassium: 3.8 mmol/L (ref 3.5–5.1)
Sodium: 135 mmol/L (ref 135–145)

## 2015-04-17 LAB — URINE MICROSCOPIC-ADD ON

## 2015-04-17 LAB — URINALYSIS, ROUTINE W REFLEX MICROSCOPIC
Bilirubin Urine: NEGATIVE
Glucose, UA: NEGATIVE mg/dL
Ketones, ur: NEGATIVE mg/dL
Leukocytes, UA: NEGATIVE
Nitrite: NEGATIVE
Protein, ur: 100 mg/dL — AB
Specific Gravity, Urine: 1.014 (ref 1.005–1.030)
pH: 6.5 (ref 5.0–8.0)

## 2015-04-17 LAB — CBC
HCT: 38.4 % (ref 36.0–46.0)
Hemoglobin: 12.7 g/dL (ref 12.0–15.0)
MCH: 31.3 pg (ref 26.0–34.0)
MCHC: 33.1 g/dL (ref 30.0–36.0)
MCV: 94.6 fL (ref 78.0–100.0)
Platelets: 196 10*3/uL (ref 150–400)
RBC: 4.06 MIL/uL (ref 3.87–5.11)
RDW: 14.3 % (ref 11.5–15.5)
WBC: 10.1 10*3/uL (ref 4.0–10.5)

## 2015-04-17 NOTE — Progress Notes (Signed)
Carrie Smith [id number] DOB: 01/19/1936 DOA: 04/13/2015 PCP: No primary care provider on file.  Brief narrative: 79 y/o ?L4-L5 grade 1 spondylolisthesis status post decompression and laminectomy 2005 Celiac disease , remote CVA 1887, Bipolar and reported dementia, Former smoker, Heart failure on Zaroxolyn and Lasix. Admitted with LOC, found unconscious in feces and urine and came to ED where found to have pulmonary embolism on CT chest. Troponin bump noted from 1.2-3.3 and still trending, lactic acid 3.08, elevated BNP CK total and mildly elevated AST 120 Placed on SQ Lovenox  Assessment/Plan:  1. Acute PE-hemodynamically stable, intermediate risk PE with RV/LV=0.96- LLE DVT ruled out 11/14 2D TT Echo 11/15 confirms mild Pulm Htn + Grade 2 DD.          -Initially started on full dose subcutaneous Lovenox, transitioned to PO Xarelto          -Clinically stable, continue Xarelto for atleast 41months  2. Elevated troponin, BNP and CK.          -Secondary to demand/strain due to large PE           -troponin peaked at 4.6 , echo with preserved EF and wall motion           -Appreciate cardiology consult  3. Remote CVA 1987-stable currently           -Continue aspirin, statin  4. Chronic Diastolic HF-            -Resume Lasix 40 mg daly, Zaroxolyn on hold  5. Hypokalemia-replaced  6. CKD III           -Stable  7. Bipolar and dementia      -see below  8. Metabolic encephalopathy -has mild dementia but mentation worse than baseline per son, CT without acute findings -Dc foley, check UA, may need MRI brain if no changes, meds reviewed-no changes - has periods of sundowning  Code Status: Full Family Communication:d/w son today Dispo: SNF in few days if stable/mentation improves   Past medical history-As per Problem list Chart reviewed as below- Reviewed  Consultants: Cardiology   Procedures: CT chest   Antibiotics: Azithromycin 11/14 Ceftriaxone 11/14    Subjective   No complaints, intermittent confusion, no events overnight   Objective     Objective: Filed Vitals:   04/16/15 1200 04/16/15 2114 04/16/15 2309 04/17/15 0503  BP:  162/81  138/78  Pulse:  103  81  Temp: 99.9 F (37.7 C) 100.2 F (37.9 C) 98.6 F (37 C) 98.7 F (37.1 C)  TempSrc: Oral Oral Oral Oral  Resp:  20  20  Height:      Weight:      SpO2:  92%  93%    Intake/Output Summary (Last 24 hours) at 04/17/15 1334 Last data filed at 04/17/15 1045  Gross per 24 hour  Intake    840 ml  Output   2375 ml  Net  -1535 ml    Exam:  General: AAOx to self and place only, unable to carry complete conversation Cardiovascular: s1 s 2 RRR, no M, no carotid bruit Respiratory: CTAB Abdomen: soft, NT, BS present Ext: trace edema Neuro intact  Data Reviewed: Basic Metabolic Panel:  Recent Labs Lab 04/13/15 1709  04/14/15 0448 04/15/15 0356 04/16/15 0340 04/17/15 0450  NA 138  --  139 141 139 135  K 5.9*  < > 3.4* 3.1* 3.6 3.8  CL 102  --  108 112* 108 99*  CO2 25  --  24 23 26 28   GLUCOSE 157*  --  132* 114* 112* 121*  BUN 41*  --  33* 25* 20 17  CREATININE 1.28*  --  0.94 0.81 0.82 0.86  CALCIUM 9.1  --  8.1* 8.1* 8.3* 8.3*  < > = values in this interval not displayed. Liver Function Tests:  Recent Labs Lab 04/13/15 1709 04/14/15 0448 04/15/15 0356  AST 120* 71* 59*  ALT 50 35 35  ALKPHOS 103 64 64  BILITOT 2.3* 0.7 0.6  PROT 7.1 5.4* 5.2*  ALBUMIN 3.3* 2.6* 2.5*   No results for input(s): LIPASE, AMYLASE in the last 168 hours. No results for input(s): AMMONIA in the last 168 hours. CBC:  Recent Labs Lab 04/13/15 1709 04/14/15 0448 04/15/15 0356 04/16/15 0340 04/17/15 0450  WBC 18.0* 10.6* 8.2 9.1 10.1  NEUTROABS 15.5*  --   --   --   --   HGB 15.9* 12.9 12.0 12.3 12.7  HCT 46.1* 37.8 36.0 36.6 38.4  MCV 93.1 92.2 94.7 93.6 94.6  PLT 235 163 156 138* 196   Cardiac Enzymes:  Recent Labs Lab 04/13/15 1709 04/13/15 2300  04/14/15 0448 04/14/15 1030 04/16/15 0839  CKTOTAL 1210* 1542*  --   --   --   TROPONINI  --  1.28* 3.37* 4.61* 0.71*   BNP: Invalid input(s): POCBNP CBG:  Recent Labs Lab 04/13/15 1615  GLUCAP 163*    Recent Results (from the past 240 hour(s))  Urine culture     Status: None   Collection Time: 04/13/15  4:30 PM  Result Value Ref Range Status   Specimen Description URINE, CATHETERIZED  Final   Special Requests NONE  Final   Culture   Final    NO GROWTH 1 DAY Performed at [facility]    Report Status 04/14/2015 FINAL  Final  Blood culture (routine x 2)     Status: None (Preliminary result)   Collection Time: 04/13/15  5:20 PM  Result Value Ref Range Status   Specimen Description LEFT ANTECUBITAL  Final   Special Requests BOTTLES DRAWN AEROBIC AND ANAEROBIC 5CC  Final   Culture   Final    NO GROWTH 3 DAYS Performed at [facility]    Report Status PENDING  Incomplete  Blood culture (routine x 2)     Status: None (Preliminary result)   Collection Time: 04/13/15  5:52 PM  Result Value Ref Range Status   Specimen Description BLOOD LEFT WRIST  Final   Special Requests BOTTLES DRAWN AEROBIC ONLY 5CC  Final   Culture   Final    NO GROWTH 2 DAYS Performed at [facility]    Report Status PENDING  Incomplete  MRSA PCR Screening     Status: None   Collection Time: 04/13/15 10:05 PM  Result Value Ref Range Status   MRSA by PCR NEGATIVE NEGATIVE Final    Comment:        The GeneXpert MRSA Assay (FDA approved for NASAL specimens only), is one component of a comprehensive MRSA colonization surveillance program. It is not intended to diagnose MRSA infection nor to guide or monitor treatment for MRSA infections.      Studies:              All Imaging reviewed and is as per above notation   Scheduled Meds: . allopurinol  300 mg Oral Daily  . aspirin EC  81 mg Oral Daily  . famotidine  20 mg Oral Daily  .  furosemide  40 mg Oral Daily  .  metoprolol tartrate  12.5 mg Oral BID  . nortriptyline  25 mg Oral QHS  . potassium chloride  40 mEq Oral Daily  . Rivaroxaban  15 mg Oral BID WC  . sodium chloride  3 mL Intravenous Q12H   Continuous Infusions:     Domenic Polite, MD  [facility] Pager [phone] 04/17/2015, 1:34 PM    LOS: 4 days

## 2015-04-17 NOTE — Care Management Note (Signed)
Case Management Note  Patient Details  Name: Carrie Smith MRN: [id number] Date of Birth: 23-Sep-1935  Subjective/Objective:                  Fall, confusion/PE Action/Plan: Discharge planning Expected Discharge Date:                  Expected Discharge Plan:  Skilled Nursing Facility  In-House Referral:     Discharge planning Services  CM Consult  Post Acute Care Choice:    Choice offered to:     DME Arranged:    DME Agency:     HH Arranged:    [place] Agency:     Status of Service:  Completed, signed off  Medicare Important Message Given:    Date Medicare IM Given:    Medicare IM give by:    Date Additional Medicare IM Given:    Additional Medicare Important Message give by:     If discussed at [place] of Stay Meetings, dates discussed:    Additional Comments: CM notes pt to go to SNF; CSW aware and arranging.  No other CM needs were communicated. Dellie Catholic, RN 04/17/2015, 10:39 AM

## 2015-04-17 NOTE — NC FL2 (Signed)
[place] LEVEL OF CARE SCREENING TOOL     IDENTIFICATION  Patient Name: Carrie Smith Birthdate: 1935/08/19 Sex: female Admission Date (Current Location): 04/13/2015  [facility] and [place] Number: Herbalist and Address:  [facility],  [place] [address] [place]      Provider Number: [id number]  Attending Physician Name and Address:  Domenic Polite, MD  Relative Name and Phone Number:       Current Level of Care: Hospital Recommended Level of Care: [place] Prior Approval Number:    Date Approved/Denied:   PASRR Number:    Discharge Plan: SNF    Current Diagnoses: Patient Active Problem List   Diagnosis Date Noted  . Pressure ulcer 04/14/2015  . Obesity 04/14/2015  . Acute encephalopathy 04/14/2015  . AKI (acute kidney injury) ([place]) 04/14/2015  . Hyperkalemia 04/14/2015  . Hypokalemia 04/14/2015  . Pulmonary embolism ([place]) 04/13/2015  . Elevated troponin 04/13/2015  . Elevated transaminase level 04/13/2015  . Rhabdomyolysis 04/13/2015  . GERD 06/25/2008  . Celiac disease 06/25/2008  . HYPERCHOLESTEROLEMIA 06/24/2008  . EXTERNAL HEMORRHOIDS 06/24/2008  . LOW BACK PAIN, CHRONIC 06/24/2008  . OSTEOPOROSIS 06/24/2008  . OTHER SPEC GASTRITIS WITHOUT MENTION HEMORRHAGE 09/06/2007  . ISCHEMIC COLITIS 09/06/2007  . DIVERTICULOSIS, COLON 09/06/2007    Orientation ACTIVITIES/SOCIAL BLADDER RESPIRATION    Self  Active Continent Normal  BEHAVIORAL SYMPTOMS/MOOD NEUROLOGICAL BOWEL NUTRITION STATUS      Continent Diet  PHYSICIAN VISITS COMMUNICATION OF NEEDS Height & Weight Skin      4\' 11"  (149.9 cm) 198 lbs. PU Stage and Appropriate Care          AMBULATORY STATUS RESPIRATION    Assist extensive Normal      Personal Care Assistance Level of Assistance  Bathing, Dressing Bathing Assistance: Limited assistance   Dressing Assistance: Limited assistance      Functional Limitations Info                 SPECIAL CARE FACTORS FREQUENCY  PT (By licensed PT), OT (By licensed OT)     PT Frequency: 5 OT Frequency: 5           Additional Factors Info                  Current Medications (04/17/2015): Current Facility-Administered Medications  Medication Dose Route Frequency Provider Last Rate Last Dose  . acetaminophen (TYLENOL) tablet 650 mg  650 mg Oral Q6H PRN Edwin Dada, MD   650 mg at 04/16/15 2127   Or  . acetaminophen (TYLENOL) suppository 650 mg  650 mg Rectal Q6H PRN Edwin Dada, MD      . allopurinol (ZYLOPRIM) tablet 300 mg  300 mg Oral Daily Nita Sells, MD   300 mg at 04/17/15 0825  . aspirin EC tablet 81 mg  81 mg Oral Daily Dayna N Dunn, PA-C   81 mg at 04/17/15 0825  . famotidine (PEPCID) tablet 20 mg  20 mg Oral Daily Nita Sells, MD   20 mg at 04/17/15 0826  . furosemide (LASIX) tablet 40 mg  40 mg Oral Daily Domenic Polite, MD   40 mg at 04/17/15 0826  . metoprolol tartrate (LOPRESSOR) tablet 12.5 mg  12.5 mg Oral BID Dayna N Dunn, PA-C   12.5 mg at 04/17/15 0826  . nortriptyline (PAMELOR) capsule 25 mg  25 mg Oral QHS Nita Sells, MD   25 mg at 04/16/15 2208  . ondansetron (  ZOFRAN) tablet 4 mg  4 mg Oral Q6H PRN Edwin Dada, MD       Or  . ondansetron (ZOFRAN) injection 4 mg  4 mg Intravenous Q6H PRN Edwin Dada, MD      . ondansetron (ZOFRAN) tablet 8 mg  8 mg Oral Q8H PRN Nita Sells, MD      . potassium chloride SA (K-DUR,KLOR-CON) CR tablet 40 mEq  40 mEq Oral Daily Nita Sells, MD   40 mEq at 04/17/15 0826  . Rivaroxaban (XARELTO) tablet 15 mg  15 mg Oral BID WC Berton Mount, RPH   15 mg at 04/17/15 0825  . senna-docusate (Senokot-S) tablet 1 tablet  1 tablet Oral QHS PRN Edwin Dada, MD      . sodium chloride 0.9 % injection 3 mL  3 mL Intravenous Q12H Edwin Dada, MD   3 mL at 04/17/15 1000  . traMADol (ULTRAM) tablet 50-100 mg  50-100 mg  Oral Q4H PRN Nita Sells, MD   50 mg at 04/16/15 2209  . traZODone (DESYREL) tablet 25-50 mg  25-50 mg Oral QHS PRN Nita Sells, MD   50 mg at 04/14/15 2250   Do not use this list as official medication orders. Please verify with discharge summary.  Discharge Medications:   Medication List    ASK your doctor about these medications        allopurinol 300 MG tablet  Commonly known as:  ZYLOPRIM  TAKE 1 TABLET EVERY DAY TO PREVENT GOUT     atorvastatin 80 MG tablet  Commonly known as:  LIPITOR  Take 80 mg by mouth daily.     cimetidine 800 MG tablet  Commonly known as:  TAGAMET  Take 800 mg by mouth at bedtime.     furosemide 40 MG tablet  Commonly known as:  LASIX  Take 40 mg by mouth daily as needed for fluid.     metolazone 2.5 MG tablet  Commonly known as:  ZAROXOLYN  Take 2.5 mg by mouth daily.     nortriptyline 25 MG capsule  Commonly known as:  PAMELOR  Take 25 mg by mouth at bedtime.     ondansetron 8 MG tablet  Commonly known as:  ZOFRAN  Take 8 mg by mouth every 8 (eight) hours as needed for nausea or vomiting.     traMADol 50 MG tablet  Commonly known as:  ULTRAM  Take 50-100 mg by mouth every 4 (four) hours as needed for moderate pain or severe pain. Max 5 per day     traZODone 50 MG tablet  Commonly known as:  DESYREL  Take 25-50 mg by mouth at bedtime as needed for sleep.        Relevant Imaging Results:  Relevant Lab Results:  Recent Labs    Additional Information SSN [id number]  Ludwig Clarks, LCSW

## 2015-04-17 NOTE — Plan of Care (Signed)
Problem: Safety: Goal: Ability to remain free from injury will improve Outcome: Progressing Pt bed alarm on, non-skid socks on, side rails are up, and call bell within reach.

## 2015-04-17 NOTE — Care Management Important Message (Signed)
Important Message  Patient Details  Name: Carrie Smith MRN: [id number] Date of Birth: March 17, 1936   Medicare Important Message Given:  Yes    Camillo Flaming 04/17/2015, 2:55 [place] Message  Patient Details  Name: Carrie Smith MRN: [id number] Date of Birth: 28-Dec-1935   Medicare Important Message Given:  Yes    Camillo Flaming 04/17/2015, 2:55 PM

## 2015-04-18 DIAGNOSIS — M549 Dorsalgia, unspecified: Secondary | ICD-10-CM | POA: Diagnosis not present

## 2015-04-18 DIAGNOSIS — I82403 Acute embolism and thrombosis of unspecified deep veins of lower extremity, bilateral: Secondary | ICD-10-CM | POA: Diagnosis not present

## 2015-04-18 DIAGNOSIS — I5032 Chronic diastolic (congestive) heart failure: Secondary | ICD-10-CM | POA: Diagnosis not present

## 2015-04-18 DIAGNOSIS — M6281 Muscle weakness (generalized): Secondary | ICD-10-CM | POA: Diagnosis not present

## 2015-04-18 DIAGNOSIS — R740 Nonspecific elevation of levels of transaminase and lactic acid dehydrogenase [LDH]: Secondary | ICD-10-CM | POA: Diagnosis not present

## 2015-04-18 DIAGNOSIS — N179 Acute kidney failure, unspecified: Secondary | ICD-10-CM | POA: Diagnosis not present

## 2015-04-18 DIAGNOSIS — I2699 Other pulmonary embolism without acute cor pulmonale: Secondary | ICD-10-CM | POA: Diagnosis not present

## 2015-04-18 DIAGNOSIS — M1990 Unspecified osteoarthritis, unspecified site: Secondary | ICD-10-CM | POA: Diagnosis not present

## 2015-04-18 DIAGNOSIS — M810 Age-related osteoporosis without current pathological fracture: Secondary | ICD-10-CM | POA: Diagnosis not present

## 2015-04-18 DIAGNOSIS — R278 Other lack of coordination: Secondary | ICD-10-CM | POA: Diagnosis not present

## 2015-04-18 LAB — CBC
HCT: 38.8 % (ref 36.0–46.0)
Hemoglobin: 12.8 g/dL (ref 12.0–15.0)
MCH: 31.1 pg (ref 26.0–34.0)
MCHC: 33 g/dL (ref 30.0–36.0)
MCV: 94.2 fL (ref 78.0–100.0)
Platelets: 209 10*3/uL (ref 150–400)
RBC: 4.12 MIL/uL (ref 3.87–5.11)
RDW: 14.4 % (ref 11.5–15.5)
WBC: 8.9 10*3/uL (ref 4.0–10.5)

## 2015-04-18 LAB — CULTURE, BLOOD (ROUTINE X 2): Culture: NO GROWTH

## 2015-04-18 LAB — BASIC METABOLIC PANEL
Anion gap: 8 (ref 5–15)
BUN: 19 mg/dL (ref 6–20)
CO2: 27 mmol/L (ref 22–32)
Calcium: 8.4 mg/dL — ABNORMAL LOW (ref 8.9–10.3)
Chloride: 102 mmol/L (ref 101–111)
Creatinine, Ser: 0.9 mg/dL (ref 0.44–1.00)
GFR calc Af Amer: >60 mL/min (ref >60)
GFR calc non Af Amer: >60 mL/min (ref >60)
Glucose, Bld: 133 mg/dL — ABNORMAL HIGH (ref 65–99)
Potassium: 4 mmol/L (ref 3.5–5.1)
Sodium: 137 mmol/L (ref 135–145)

## 2015-04-18 MED ORDER — FUROSEMIDE 40 MG PO TABS: 40.0000 mg | ORAL_TABLET | Freq: Every day | ORAL | Status: DC

## 2015-04-18 MED ORDER — POTASSIUM CHLORIDE CRYS ER 20 MEQ PO TBCR: 40.0000 meq | EXTENDED_RELEASE_TABLET | Freq: Every day | ORAL | Status: DC

## 2015-04-18 MED ORDER — METOPROLOL TARTRATE 25 MG PO TABS
12.5000 mg | ORAL_TABLET | Freq: Two times a day (BID) | ORAL | Status: DC
Start: 2015-04-18 — End: 2020-07-23

## 2015-04-18 MED ORDER — RIVAROXABAN 15 MG PO TABS: 15.0000 mg | ORAL_TABLET | Freq: Two times a day (BID) | ORAL | Status: DC

## 2015-04-18 NOTE — Progress Notes (Signed)
Physical Therapy Treatment Patient Details Name: Carrie Smith MRN: [id number] DOB: [birth year] Today's Date: 04/18/2015    History of Present Illness 79 yo female admitted 04/13/15 after son found her down at home, incontinent and confused. Patient has history of dementia, lived alone, ambulated with RW. In ED, the patient was hypoxic to 80%, tachycardic and tachypneic. An ABG showed a mild respiratory alkalosis and hypoxia , and a CT angiogram of the chest showed bilateral segmental PE . Xray negative for hip fractures, CT head showed chronic occipaital infarcts.    PT Comments    Pt AxOx2 and required max encouragement to participate.  Pt likes to joke around, stating "I'm not getting up because I am lazy".  Assisted to EOB required + 2 Total Assist as pt put forth minimal effort.  Attempted sit to stand to [facility] x 3 unsuccessfully.  Pt unable to clear hips off bed and unable to rise fully.  Called nursing staff for extra assist but no reply so had son who is a firefighter assist transferring pt from elevated bed to recliner.    Follow Up Recommendations  SNF     Equipment Recommendations       Recommendations for Other Services       Precautions / Restrictions Precautions Precautions: Fall Precaution Comments: monitor HR and sats Restrictions Weight Bearing Restrictions: No    Mobility  Bed Mobility Overal bed mobility: +2 for physical assistance;+ 2 for safety/equipment;Needs Assistance Bed Mobility: Supine to Sit     Supine to sit: Total assist;+2 for physical assistance;+2 for safety/equipment;HOB elevated (pt 5%)     General bed mobility comments: pt put forth minimal effort  Transfers Overall transfer level: Needs assistance Equipment used: None Transfers: Sit to/from Stand Sit to Stand: +2 physical assistance;+2 safety/equipment;Total assist         General transfer comment: attempted sit to stand x 3.  Pt unable to clear hips off bed.  assisted from  elevated bed to recliner 1/4 pivot in complete pivot.  Son had to assist with transfer Armed forces logistics/support/administrative officer)  Ambulation/Gait             General Gait Details: unable to attempt due to low transfer perfomance.     Stairs            Wheelchair Mobility    Modified Rankin (Stroke Patients Only)       Balance                                    Cognition Arousal/Alertness: Awake/alert Behavior During Therapy:  (bruatly honest, adament, at times uncooperative)   Area of Impairment: Orientation;Safety/judgement;Problem solving               General Comments: pt likes to joke around and tells you she won't do............    Exercises      General Comments        Pertinent Vitals/Pain Pain Assessment: Faces Faces Pain Scale: Hurts even more Pain Location: R UE during Bp  Pain Descriptors / Indicators: Tender Pain Intervention(s): Repositioned;Monitored during session    Home Living                      Prior Function            PT Goals (current goals can now be found in the care plan section) Progress towards PT goals: Progressing toward goals  Frequency  Min 3X/week    PT Plan      Co-evaluation             End of Session Equipment Utilized During Treatment: Gait belt Activity Tolerance: Patient limited by fatigue;Treatment limited secondary to medical complications (Comment) Patient left: in chair;with call bell/phone within reach;with chair alarm set     Time: 1120-1150 PT Time Calculation (min) (ACUTE ONLY): 30 min  Charges:  $Therapeutic Activity: 23-37 mins                    G Codes:      Rica Koyanagi  PTA [facility]  Acute  Rehab Pager      [phone]

## 2015-04-18 NOTE — Progress Notes (Signed)
Report called to [facility] @ [facility].  Lind Guest, RN

## 2015-04-18 NOTE — Discharge Summary (Signed)
Physician Discharge Summary  Carrie Smith [id number] DOB: June 02, 1935 DOA: 04/13/2015  PCP: No primary care provider on file.  Admit date: 04/13/2015 Discharge date: 04/18/2015  Time spent: 45 minutes  Recommendations for Outpatient Follow-up:  1. Started on Xarelto for Acute PE, change dose to 20mg  daily from 12/8 2. Resume Zaroxolyn PRN at FU if appropriate   Discharge Diagnoses:  Active Problems:   Acute Pulmonary embolism (HCC)   Elevated troponin   Elevated transaminase level   Rhabdomyolysis   Pressure ulcer   Obesity   AKI (acute kidney injury) ([place])   Mild Dementia   Metabolic Encephalopathy   Severe Osteoarthritis of both knees  Discharge Condition: stable Diet recommendation:heart healthy  Filed Weights   04/15/15 0400 04/15/15 1040 04/16/15 0437  Weight: 90.2 kg (198 lb 13.7 oz) 88.4 kg (194 lb 14.2 oz) 90.1 kg (198 lb 10.2 oz)    History of present illness:  Chief Complaint: Fall, confusion HPI: KAJAH Smith is a 79 y.o. female with a past medical history significant for moderate dementia, celiac disease and gout who presented with fall and confusion and hypoxia. History was obtained from son, [name], who is an EMT.on 11/13, the patient's son came to her house and found her lying on the living room floor in feces and urine and confused and unable to state what had happened. He helped her up and he and his wife bathed her while calling for EMS. The patient appeared much more confused than her baseline, was breathing heavily, was globally very weak, and complained of some left leg discomfort. In the ED, the patient was hypoxic to 80%, tachycardic and tachypneic.TNI was mildly elevated, and a CT angiogram of the chest showed bilateral segmental PE  Hospital Course:   1. Acute PE-hemodynamically stable, intermediate risk PE with RV/LV=0.96       - LLE DVT ruled out 11/14 2D TT Echo 11/15 confirms mild Pulm Htn + Grade 2 DD.  -Initially started  on full dose subcutaneous Lovenox, transitioned to PO Xarelto  -Clinically stable, continue Xarelto for atleast 31months, 15mg  BID for 21days then 20mg  daily  2. Elevated troponin, BNP and CK.  -Secondary to demand/strain due to large PE   -troponin peaked at 4.6 , echo with preserved EF and wall motion   -Seen by cardiology in consultation, no further ischemic w/u recommended given her lack of symptoms  3. Remote CVA 1987-stable currently  -Continue aspirin, statin  4. Chronic Diastolic HF-  -Resume Lasix 40 mg daly, Zaroxolyn held, resume PRN at follow Up  5. Hypokalemia-replaced  6. CKD III  -Stable  7. Mild Dementia  -see below  8. Metabolic encephalopathy -has mild dementia but mentation worse than baseline per son on admission, CT without acute findings -suspect Delirium from acute illness in background of mild Dementia, had periods of sundowning -now much improved, able to carry an appropriate conversation with occasional short term memory deficits -TSH -WNL   Discharge Exam: Filed Vitals:   04/18/15 1133  BP: 159/81  Pulse: 93  Temp:   Resp:     General: AAOx to self and place Cardiovascular: S1S2/RRR Respiratory:CTAB  Discharge Instructions   Discharge Instructions    Diet - low sodium heart healthy    Complete by:  As directed      Increase activity slowly    Complete by:  As directed           Current Discharge Medication List    START taking these medications  Details  metoprolol tartrate (LOPRESSOR) 25 MG tablet Take 0.5 tablets (12.5 mg total) by mouth 2 (two) times daily.    potassium chloride SA (K-DUR,KLOR-CON) 20 MEQ tablet Take 2 tablets (40 mEq total) by mouth daily.    Rivaroxaban (XARELTO) 15 MG TABS tablet Take 1 tablet (15 mg total) by mouth 2 (two) times daily with a meal. Qty: 30 tablet, Refills: 0      CONTINUE these medications which have CHANGED    Details  furosemide (LASIX) 40 MG tablet Take 1 tablet (40 mg total) by mouth daily.      CONTINUE these medications which have NOT CHANGED   Details  allopurinol (ZYLOPRIM) 300 MG tablet TAKE 1 TABLET EVERY DAY TO PREVENT GOUT Refills: 3    atorvastatin (LIPITOR) 80 MG tablet Take 80 mg by mouth daily. Refills: 0    cimetidine (TAGAMET) 800 MG tablet Take 800 mg by mouth at bedtime.    nortriptyline (PAMELOR) 25 MG capsule Take 25 mg by mouth at bedtime.    ondansetron (ZOFRAN) 8 MG tablet Take 8 mg by mouth every 8 (eight) hours as needed for nausea or vomiting.    traMADol (ULTRAM) 50 MG tablet Take 50-100 mg by mouth every 4 (four) hours as needed for moderate pain or severe pain. Max 5 per day Refills: 0    traZODone (DESYREL) 50 MG tablet Take 25-50 mg by mouth at bedtime as needed for sleep.      STOP taking these medications     metolazone (ZAROXOLYN) 2.5 MG tablet        Allergies  Allergen Reactions  . Codeine       The results of significant diagnostics from this hospitalization (including imaging, microbiology, ancillary and laboratory) are listed below for reference.    Significant Diagnostic Studies: Dg Chest 1 View  04/13/2015  CLINICAL DATA:  Found on floor. Concern for chest injury. Initial encounter. EXAM: CHEST 1 VIEW COMPARISON:  None. FINDINGS: There is elevation of the right hemidiaphragm. There is no evidence of focal opacification, pleural effusion or pneumothorax. The cardiomediastinal silhouette is borderline normal in size. No acute osseous abnormalities are seen. There is chronic elevation of the right humeral head, reflecting chronic rotator cuff tear. IMPRESSION: Elevation of the right hemidiaphragm. Lungs remain grossly clear. No displaced rib fracture seen. Electronically Signed   By: Garald Balding M.D.   On: 04/13/2015 18:28   Ct Head Wo Contrast  04/13/2015  CLINICAL DATA:  Fall. Found on floor incontinent of urine and stool.  Lightheaded. Initial encounter. EXAM: CT HEAD WITHOUT CONTRAST TECHNIQUE: Contiguous axial images were obtained from the base of the skull through the vertex without intravenous contrast. COMPARISON:  Brain MRI 10/30/2008 FINDINGS: Multiple small infarcts are present in the right cerebellum, generally chronic in appearance although increased in number from the prior MRI. There may be a new small infarct inferiorly in the left cerebellum. There is moderate enlargement of the ventricles which is slightly more prominent than on the prior study. The cerebral sulci are normal in size. A small, chronic left occipital cortical infarct is again seen. Periventricular and subcortical cerebral white matter hypodensities are nonspecific but compatible with moderate chronic small vessel ischemic disease. There is no evidence of acute large territory infarct, intracranial hemorrhage, mass, midline shift, or extra-axial fluid collection. Right frontotemporal scalp swelling is noted. The orbits are unremarkable. Mild debris is noted in the left sphenoid sinus. The mastoid air cells are clear. No skull fracture is identified.  Calcified atherosclerosis is noted at the skull base. IMPRESSION: 1. Right frontotemporal scalp swelling. No skull fracture or intracranial hemorrhage identified. 2. Multiple small infarcts in the cerebellum, increased in number from 2010. These are overall chronic in appearance, however superimposed acute or subacute ischemia is not excluded given the interval change. 3. Chronic left occipital infarct and chronic small vessel ischemic disease in the cerebral white matter. 4. Moderate ventriculomegaly, slightly increased from the prior MRI. This may reflect central predominant cerebral atrophy, however normal pressure hydrocephalus is not excluded in the appropriate clinical setting. Electronically Signed   By: Logan Bores M.D.   On: 04/13/2015 18:17   Ct Angio Chest Pe W/cm &/or Wo Cm  04/13/2015   CLINICAL DATA:  Patient found on floor unresponsive. Desaturation. Initial encounter. EXAM: CT ANGIOGRAPHY CHEST WITH CONTRAST TECHNIQUE: Multidetector CT imaging of the chest was performed using the standard protocol during bolus administration of intravenous contrast. Multiplanar CT image reconstructions and MIPs were obtained to evaluate the vascular anatomy. CONTRAST:  16mL OMNIPAQUE IOHEXOL 350 MG/ML SOLN COMPARISON:  Chest radiograph performed earlier today at 6:12 p.m. FINDINGS: Segmental pulmonary emboli are noted within pulmonary arteries to the right lower lobe and left upper lobe, and smaller emboli within the pulmonary arteries to the right upper lobe and left lower lobe. The RV/LV ratio of 0.96 corresponds to concern for submassive pulmonary embolus. Mild patchy right-sided atelectasis is noted. The lungs are otherwise grossly clear. There is no evidence of pleural effusion or pneumothorax. No masses are identified; no abnormal focal contrast enhancement is seen. Scattered mediastinal nodes remain normal in size. No pericardial effusion is identified. Scattered calcification is noted along the thoracic aorta. The great vessels are grossly unremarkable. No axillary lymphadenopathy is seen. The thyroid gland is unremarkable in appearance. The visualized portions of the liver and spleen are unremarkable. No acute osseous abnormalities are seen. Review of the MIP images confirms the above findings. IMPRESSION: 1. Segmental pulmonary emboli within pulmonary arteries to the right lower lobe and left upper lobe, and smaller emboli within the pulmonary arteries to the right upper lobe and left lower lobe. CT evidence of right heart strain (RV/LV Ratio = 0.96) consistent with at least submassive (intermediate risk) PE. The presence of right heart strain has been associated with an increased risk of morbidity and mortality. Please activate Code PE by paging [phone]. 2. Mild patchy right-sided atelectasis  noted. Lungs otherwise clear. Critical Value/emergent results were called by telephone at the time of interpretation on 04/13/2015 at 8:31 pm to Dr. Sherwood Gambler, who verbally acknowledged these results. Electronically Signed   By: Garald Balding M.D.   On: 04/13/2015 20:31   Dg Hip Unilat With Pelvis 2-3 Views Left  04/13/2015  CLINICAL DATA:  Status post fall. Concern for left hip injury. Initial encounter. EXAM: DG HIP (WITH OR WITHOUT PELVIS) 2-3V LEFT COMPARISON:  None. FINDINGS: There is no evidence of fracture or dislocation. Both femoral heads are seated normally within their respective acetabula. The proximal left femur appears intact. Mild sclerotic change is noted at the left sacroiliac joint. The patient is status post lumbar spinal fusion at L4-L5. The visualized bowel gas pattern is grossly unremarkable in appearance. IMPRESSION: No evidence of fracture or dislocation. Electronically Signed   By: Garald Balding M.D.   On: 04/13/2015 18:29    Microbiology: Recent Results (from the past 240 hour(s))  Urine culture     Status: None   Collection Time: 04/13/15  4:30 PM  Result Value Ref Range Status   Specimen Description URINE, CATHETERIZED  Final   Special Requests NONE  Final   Culture   Final    NO GROWTH 1 DAY Performed at [facility]    Report Status 04/14/2015 FINAL  Final  Blood culture (routine x 2)     Status: None   Collection Time: 04/13/15  5:20 PM  Result Value Ref Range Status   Specimen Description LEFT ANTECUBITAL  Final   Special Requests BOTTLES DRAWN AEROBIC AND ANAEROBIC 5CC  Final   Culture   Final    NO GROWTH 5 DAYS Performed at [facility]    Report Status 04/18/2015 FINAL  Final  Blood culture (routine x 2)     Status: None (Preliminary result)   Collection Time: 04/13/15  5:52 PM  Result Value Ref Range Status   Specimen Description BLOOD LEFT WRIST  Final   Special Requests BOTTLES DRAWN AEROBIC ONLY 5CC  Final   Culture    Final    NO GROWTH 4 DAYS Performed at [facility]    Report Status PENDING  Incomplete  MRSA PCR Screening     Status: None   Collection Time: 04/13/15 10:05 PM  Result Value Ref Range Status   MRSA by PCR NEGATIVE NEGATIVE Final    Comment:        The GeneXpert MRSA Assay (FDA approved for NASAL specimens only), is one component of a comprehensive MRSA colonization surveillance program. It is not intended to diagnose MRSA infection nor to guide or monitor treatment for MRSA infections.      Labs: Basic Metabolic Panel:  Recent Labs Lab 04/14/15 0448 04/15/15 0356 04/16/15 0340 04/17/15 0450 04/18/15 0456  NA 139 141 139 135 137  K 3.4* 3.1* 3.6 3.8 4.0  CL 108 112* 108 99* 102  CO2 24 23 26 28 27   GLUCOSE 132* 114* 112* 121* 133*  BUN 33* 25* 20 17 19   CREATININE 0.94 0.81 0.82 0.86 0.90  CALCIUM 8.1* 8.1* 8.3* 8.3* 8.4*   Liver Function Tests:  Recent Labs Lab 04/13/15 1709 04/14/15 0448 04/15/15 0356  AST 120* 71* 59*  ALT 50 35 35  ALKPHOS 103 64 64  BILITOT 2.3* 0.7 0.6  PROT 7.1 5.4* 5.2*  ALBUMIN 3.3* 2.6* 2.5*   No results for input(s): LIPASE, AMYLASE in the last 168 hours. No results for input(s): AMMONIA in the last 168 hours. CBC:  Recent Labs Lab 04/13/15 1709 04/14/15 0448 04/15/15 0356 04/16/15 0340 04/17/15 0450 04/18/15 0456  WBC 18.0* 10.6* 8.2 9.1 10.1 8.9  NEUTROABS 15.5*  --   --   --   --   --   HGB 15.9* 12.9 12.0 12.3 12.7 12.8  HCT 46.1* 37.8 36.0 36.6 38.4 38.8  MCV 93.1 92.2 94.7 93.6 94.6 94.2  PLT 235 163 156 138* 196 209   Cardiac Enzymes:  Recent Labs Lab 04/13/15 1709 04/13/15 2300 04/14/15 0448 04/14/15 1030 04/16/15 0839  CKTOTAL 1210* 1542*  --   --   --   TROPONINI  --  1.28* 3.37* 4.61* 0.71*   BNP: BNP (last 3 results)  Recent Labs  04/13/15 1709  BNP 171.7*    ProBNP (last 3 results) No results for input(s): PROBNP in the last 8760 hours.  CBG:  Recent Labs Lab  04/13/15 1615  GLUCAP 163*       Signed:  Cleavon Goldman  [facility] 04/18/2015, 12:36 PM

## 2015-04-18 NOTE — Progress Notes (Signed)
ANTICOAGULATION CONSULT NOTE - Follow-up  Pharmacy Consult for rivaroxaban Indication: pulmonary embolus  Allergies  Allergen Reactions  . Codeine     Patient Measurements: Height: 4' 11.06" (150 cm) Weight: 198 lb 10.2 oz (90.1 kg) IBW/kg (Calculated) : 43.33  Vital Signs: Temp: 98.8 F (37.1 C) (11/18 0629) Temp Source: Oral (11/18 0629) BP: 142/73 mmHg (11/18 0629) Pulse Rate: 93 (11/18 0629)  Labs:  Recent Labs  04/16/15 0340 04/16/15 0839 04/17/15 0450 04/18/15 0456  HGB 12.3  --  12.7 12.8  HCT 36.6  --  38.4 38.8  PLT 138*  --  196 209  CREATININE 0.82  --  0.86 0.90  TROPONINI  --  0.71*  --   --    Estimated Creatinine Clearance: 51.2 mL/min (by C-G formula based on Cr of 0.9).  Medications:  Scheduled:  . allopurinol  300 mg Oral Daily  . aspirin EC  81 mg Oral Daily  . famotidine  20 mg Oral Daily  . furosemide  40 mg Oral Daily  . metoprolol tartrate  12.5 mg Oral BID  . nortriptyline  25 mg Oral QHS  . potassium chloride  40 mEq Oral Daily  . Rivaroxaban  15 mg Oral BID WC  . sodium chloride  3 mL Intravenous Q12H    Assessment: 79 y/o F with moderate dementia, gout, HLD, celiac disease (versus ischemic colitis versus microscopic colitis), remote GIB in setting of NSAIDs, prior stroke, obesity who presented to [facility] with fall, confusion, and hypoxia.  CT in ED shows bilateral segmental PE with RHS.  Heparin per pharmacy ordered, but then changed to enoxaparin per pharmacy.  On 11/15 she was transitioned to rivaroxaban (Xarelto) dosing per pharmacy.  Pharmacist provided education material, including voucher for free 30-day supply on 11/15.  Today, 04/18/2015  CBC: Hgb and pltc WNL  Renal: Scr stable with CrCl > 79ml/min  No major drug-drug interactions noted.  Currently on ASA 81mg  PO daily per cardiology, d/c as appropriate with start of rivaroxaban.  Goal of Therapy:  Anti-Xa level 0.6-1 units/ml 4hrs after LMWH dose given Monitor  platelets by anticoagulation protocol: Yes   Plan:   Continue rivaroxaban 15mg  PO BID with meals for 21 days.   Start rivaroxaban 20mg  PO daily with supper on day #22 (05/07/15)  Pharmacy to follow peripherally, but will sign off note writing.   Gretta Arab PharmD, BCPS Pager [phone] 04/18/2015 9:45 AM

## 2015-04-18 NOTE — Progress Notes (Signed)
Patient for d/c today to SNF bed at [facility] rec'd. Family and patient agreeable to this plan- plan transfer via EMS. Eduard Clos, MSW, [place]

## 2015-04-19 LAB — CULTURE, BLOOD (ROUTINE X 2): Culture: NO GROWTH

## 2015-04-22 ENCOUNTER — Non-Acute Institutional Stay (SKILLED_NURSING_FACILITY): Payer: Medicare Other | Admitting: Internal Medicine

## 2015-04-22 DIAGNOSIS — R600 Localized edema: Secondary | ICD-10-CM | POA: Diagnosis not present

## 2015-04-22 DIAGNOSIS — R5381 Other malaise: Secondary | ICD-10-CM | POA: Diagnosis not present

## 2015-04-22 DIAGNOSIS — M170 Bilateral primary osteoarthritis of knee: Secondary | ICD-10-CM | POA: Diagnosis not present

## 2015-04-22 DIAGNOSIS — M6282 Rhabdomyolysis: Secondary | ICD-10-CM | POA: Diagnosis not present

## 2015-04-22 DIAGNOSIS — F329 Major depressive disorder, single episode, unspecified: Secondary | ICD-10-CM

## 2015-04-22 DIAGNOSIS — G4700 Insomnia, unspecified: Secondary | ICD-10-CM

## 2015-04-22 DIAGNOSIS — E669 Obesity, unspecified: Secondary | ICD-10-CM | POA: Diagnosis not present

## 2015-04-22 DIAGNOSIS — L8990 Pressure ulcer of unspecified site, unspecified stage: Secondary | ICD-10-CM | POA: Diagnosis not present

## 2015-04-22 DIAGNOSIS — M545 Low back pain, unspecified: Secondary | ICD-10-CM

## 2015-04-22 DIAGNOSIS — I2782 Chronic pulmonary embolism: Secondary | ICD-10-CM | POA: Diagnosis not present

## 2015-04-22 DIAGNOSIS — Z9181 History of falling: Secondary | ICD-10-CM | POA: Diagnosis not present

## 2015-04-22 DIAGNOSIS — I10 Essential (primary) hypertension: Secondary | ICD-10-CM | POA: Diagnosis not present

## 2015-04-22 DIAGNOSIS — E785 Hyperlipidemia, unspecified: Secondary | ICD-10-CM

## 2015-04-22 DIAGNOSIS — E440 Moderate protein-calorie malnutrition: Secondary | ICD-10-CM | POA: Diagnosis not present

## 2015-04-22 DIAGNOSIS — G3183 Dementia with Lewy bodies: Secondary | ICD-10-CM

## 2015-04-22 DIAGNOSIS — R41841 Cognitive communication deficit: Secondary | ICD-10-CM | POA: Diagnosis not present

## 2015-04-22 DIAGNOSIS — G8929 Other chronic pain: Secondary | ICD-10-CM

## 2015-04-22 DIAGNOSIS — F0280 Dementia in other diseases classified elsewhere without behavioral disturbance: Secondary | ICD-10-CM

## 2015-04-22 DIAGNOSIS — I5031 Acute diastolic (congestive) heart failure: Secondary | ICD-10-CM | POA: Diagnosis not present

## 2015-04-22 DIAGNOSIS — Z86711 Personal history of pulmonary embolism: Secondary | ICD-10-CM | POA: Diagnosis not present

## 2015-04-22 DIAGNOSIS — I2699 Other pulmonary embolism without acute cor pulmonale: Secondary | ICD-10-CM

## 2015-04-22 DIAGNOSIS — I5032 Chronic diastolic (congestive) heart failure: Secondary | ICD-10-CM

## 2015-04-22 DIAGNOSIS — R740 Nonspecific elevation of levels of transaminase and lactic acid dehydrogenase [LDH]: Secondary | ICD-10-CM | POA: Diagnosis not present

## 2015-04-22 DIAGNOSIS — M109 Gout, unspecified: Secondary | ICD-10-CM | POA: Diagnosis not present

## 2015-04-22 DIAGNOSIS — K900 Celiac disease: Secondary | ICD-10-CM | POA: Diagnosis not present

## 2015-04-22 DIAGNOSIS — N179 Acute kidney failure, unspecified: Secondary | ICD-10-CM | POA: Diagnosis not present

## 2015-04-22 DIAGNOSIS — R278 Other lack of coordination: Secondary | ICD-10-CM | POA: Diagnosis not present

## 2015-04-22 DIAGNOSIS — Z0000 Encounter for general adult medical examination without abnormal findings: Secondary | ICD-10-CM | POA: Diagnosis not present

## 2015-04-22 DIAGNOSIS — M6281 Muscle weakness (generalized): Secondary | ICD-10-CM | POA: Diagnosis not present

## 2015-04-22 DIAGNOSIS — G309 Alzheimer's disease, unspecified: Secondary | ICD-10-CM | POA: Diagnosis not present

## 2015-04-22 DIAGNOSIS — G9340 Encephalopathy, unspecified: Secondary | ICD-10-CM | POA: Diagnosis not present

## 2015-04-22 DIAGNOSIS — E876 Hypokalemia: Secondary | ICD-10-CM

## 2015-04-22 DIAGNOSIS — L89312 Pressure ulcer of right buttock, stage 2: Secondary | ICD-10-CM | POA: Diagnosis not present

## 2015-04-22 DIAGNOSIS — F0390 Unspecified dementia without behavioral disturbance: Secondary | ICD-10-CM | POA: Diagnosis not present

## 2015-04-22 NOTE — Progress Notes (Signed)
Patient ID: Carrie Smith, female   DOB: 06/01/1935, 79 y.o.   MRN: [id number]     Facility: [facility]    PCP: No primary care provider on file.  Code Status: full code  Allergies  Allergen Reactions  . Codeine     Chief Complaint  Patient presents with  . New Admit To SNF     HPI:  79 y.o. patient is here for short term rehabilitation post hospital admission from 04/13/15-04/18/15 post fall with acute confusion and hypoxia. She was diagnosed with acute pulmonary embolism. She was started on lovenox and then transitioned to xarelto. DVT was ruled out and echocardiogram showed grade 2 diastolic dysfunction with mild pulmonary hypertension. She had some troponin leak thought to be from demand/ strain from her PE. She was seen by cardiology and no further workup was recommended. She has PMH of old CVA, chronic diastolic chf, ckd stage 3, dementia, gout among others. She is seen in her room today with her son present. She would like to be transferred to another facility and the transportation arrangement has been made. She denies any concerns this visit.  Review of Systems:  Constitutional: Negative for fever, chills, diaphoresis.  HENT: Negative for headache, congestion, nasal discharge, difficulty swallowing.   Eyes: Negative for eye pain, blurred vision, double vision and discharge.  Respiratory: Negative for cough, shortness of breath and wheezing.   Cardiovascular: Negative for chest pain, palpitations, leg swelling.  Gastrointestinal: Negative for heartburn, nausea, vomiting, abdominal pain. Had bowel movement yesterday Genitourinary: Negative for dysuria, flank pain.  Musculoskeletal: Negative for back pain, falls in the facility Skin: Negative for itching, rash.  Neurological: Negative for dizziness, tingling, focal weakness Psychiatric/Behavioral: positive for memory loss.    Past Medical History  Diagnosis Date  . Stroke ([place])   . Back pain    . Hypercholesterolemia   . Dementia   . Hyperlipidemia   . Celiac disease     a. versus ischemic colitis versus microscopic colitis per chart.  . GI bleed     a. remote GIB in setting of NSAIDs for arthritis.  . Arthritis   . Osteoporosis   . Obesity    Past Surgical History  Procedure Laterality Date  . Back surgery    . Cholecystectomy     Social History:   reports that she has quit smoking. She does not have any smokeless tobacco history on file. She reports that she does not drink alcohol or use illicit drugs.  Family History  Problem Relation Age of Onset  . Diabetes Mother   . Heart disease Mother   . Heart attack Father     heart attack, sudden massive  . Heart disease Sister     heart disease, ?blood clot    Medications:   Medication List       This list is accurate as of: 04/22/15  1:19 PM.  Always use your most recent med list.               allopurinol 300 MG tablet  Commonly known as:  ZYLOPRIM  TAKE 1 TABLET EVERY DAY TO PREVENT GOUT     atorvastatin 80 MG tablet  Commonly known as:  LIPITOR  Take 80 mg by mouth daily.     cimetidine 800 MG tablet  Commonly known as:  TAGAMET  Take 800 mg by mouth at bedtime.     furosemide 40 MG tablet  Commonly known as:  LASIX  Take 1 tablet (40 mg total) by mouth daily.     metoprolol tartrate 25 MG tablet  Commonly known as:  LOPRESSOR  Take 0.5 tablets (12.5 mg total) by mouth 2 (two) times daily.     nortriptyline 25 MG capsule  Commonly known as:  PAMELOR  Take 25 mg by mouth at bedtime.     ondansetron 8 MG tablet  Commonly known as:  ZOFRAN  Take 8 mg by mouth every 8 (eight) hours as needed for nausea or vomiting.     potassium chloride SA 20 MEQ tablet  Commonly known as:  K-DUR,KLOR-CON  Take 2 tablets (40 mEq total) by mouth daily.     Rivaroxaban 15 MG Tabs tablet  Commonly known as:  XARELTO  Take 1 tablet (15 mg total) by mouth 2 (two) times daily with a meal.     traMADol 50  MG tablet  Commonly known as:  ULTRAM  Take 50-100 mg by mouth every 4 (four) hours as needed for moderate pain or severe pain. Max 5 per day     traZODone 50 MG tablet  Commonly known as:  DESYREL  Take 25-50 mg by mouth at bedtime as needed for sleep.         Physical Exam: Filed Vitals:   04/22/15 1318  BP: 132/64  Pulse: 75  Temp: 98.5 F (36.9 C)  Resp: 18  SpO2: 96%    General- elderly female, obese, in no acute distress Head- normocephalic, atraumatic Nose- normal nasal mucosa, no maxillary or frontal sinus tenderness, no nasal discharge Throat- moist mucus membrane Eyes- PERRLA, EOMI, no pallor, no icterus, no discharge, normal conjunctiva, normal sclera Neck- no cervical lymphadenopathy Cardiovascular- normal s1,s2, no murmurs, trace leg edema Respiratory- bilateral clear to auscultation, no wheeze, no rhonchi, no crackles, no use of accessory muscles Abdomen- bowel sounds present, soft, non tender Musculoskeletal- able to move all 4 extremities, generalized weakness, unsteady gait, on wheelchair Neurological- no focal deficit, alert and oriented to person and place Skin- warm and dry Psychiatry- normal mood and affect    Labs reviewed: Basic Metabolic Panel:  Recent Labs  04/16/15 0340 04/17/15 0450 04/18/15 0456  NA 139 135 137  K 3.6 3.8 4.0  CL 108 99* 102  CO2 26 28 27   GLUCOSE 112* 121* 133*  BUN 20 17 19   CREATININE 0.82 0.86 0.90  CALCIUM 8.3* 8.3* 8.4*   Liver Function Tests:  Recent Labs  04/13/15 1709 04/14/15 0448 04/15/15 0356  AST 120* 71* 59*  ALT 50 35 35  ALKPHOS 103 64 64  BILITOT 2.3* 0.7 0.6  PROT 7.1 5.4* 5.2*  ALBUMIN 3.3* 2.6* 2.5*   No results for input(s): LIPASE, AMYLASE in the last 8760 hours. No results for input(s): AMMONIA in the last 8760 hours. CBC:  Recent Labs  04/13/15 1709  04/16/15 0340 04/17/15 0450 04/18/15 0456  WBC 18.0*  < > 9.1 10.1 8.9  NEUTROABS 15.5*  --   --   --   --   HGB 15.9*   < > 12.3 12.7 12.8  HCT 46.1*  < > 36.6 38.4 38.8  MCV 93.1  < > 93.6 94.6 94.2  PLT 235  < > 138* 196 209  < > = values in this interval not displayed. Cardiac Enzymes:  Recent Labs  04/13/15 1709  04/13/15 2300 04/14/15 0448 04/14/15 1030 04/16/15 0839  CKTOTAL 1210*  --  1542*  --   --   --  TROPONINI  --   < > 1.28* 3.37* 4.61* 0.71*  < > = values in this interval not displayed. BNP: Invalid input(s): POCBNP CBG:  Recent Labs  04/13/15 1615  GLUCAP 163*    Radiological Exams: Dg Chest 1 View  04/13/2015  CLINICAL DATA:  Found on floor. Concern for chest injury. Initial encounter. EXAM: CHEST 1 VIEW COMPARISON:  None. FINDINGS: There is elevation of the right hemidiaphragm. There is no evidence of focal opacification, pleural effusion or pneumothorax. The cardiomediastinal silhouette is borderline normal in size. No acute osseous abnormalities are seen. There is chronic elevation of the right humeral head, reflecting chronic rotator cuff tear. IMPRESSION: Elevation of the right hemidiaphragm. Lungs remain grossly clear. No displaced rib fracture seen. Electronically Signed   By: Garald Balding M.D.   On: 04/13/2015 18:28   Ct Head Wo Contrast  04/13/2015  CLINICAL DATA:  Fall. Found on floor incontinent of urine and stool. Lightheaded. Initial encounter. EXAM: CT HEAD WITHOUT CONTRAST TECHNIQUE: Contiguous axial images were obtained from the base of the skull through the vertex without intravenous contrast. COMPARISON:  Brain MRI 10/30/2008 FINDINGS: Multiple small infarcts are present in the right cerebellum, generally chronic in appearance although increased in number from the prior MRI. There may be a new small infarct inferiorly in the left cerebellum. There is moderate enlargement of the ventricles which is slightly more prominent than on the prior study. The cerebral sulci are normal in size. A small, chronic left occipital cortical infarct is again seen. Periventricular  and subcortical cerebral white matter hypodensities are nonspecific but compatible with moderate chronic small vessel ischemic disease. There is no evidence of acute large territory infarct, intracranial hemorrhage, mass, midline shift, or extra-axial fluid collection. Right frontotemporal scalp swelling is noted. The orbits are unremarkable. Mild debris is noted in the left sphenoid sinus. The mastoid air cells are clear. No skull fracture is identified. Calcified atherosclerosis is noted at the skull base. IMPRESSION: 1. Right frontotemporal scalp swelling. No skull fracture or intracranial hemorrhage identified. 2. Multiple small infarcts in the cerebellum, increased in number from 2010. These are overall chronic in appearance, however superimposed acute or subacute ischemia is not excluded given the interval change. 3. Chronic left occipital infarct and chronic small vessel ischemic disease in the cerebral white matter. 4. Moderate ventriculomegaly, slightly increased from the prior MRI. This may reflect central predominant cerebral atrophy, however normal pressure hydrocephalus is not excluded in the appropriate clinical setting. Electronically Signed   By: Logan Bores M.D.   On: 04/13/2015 18:17   Ct Angio Chest Pe W/cm &/or Wo Cm  04/13/2015  CLINICAL DATA:  Patient found on floor unresponsive. Desaturation. Initial encounter. EXAM: CT ANGIOGRAPHY CHEST WITH CONTRAST TECHNIQUE: Multidetector CT imaging of the chest was performed using the standard protocol during bolus administration of intravenous contrast. Multiplanar CT image reconstructions and MIPs were obtained to evaluate the vascular anatomy. CONTRAST:  34mL OMNIPAQUE IOHEXOL 350 MG/ML SOLN COMPARISON:  Chest radiograph performed earlier today at 6:12 p.m. FINDINGS: Segmental pulmonary emboli are noted within pulmonary arteries to the right lower lobe and left upper lobe, and smaller emboli within the pulmonary arteries to the right upper lobe  and left lower lobe. The RV/LV ratio of 0.96 corresponds to concern for submassive pulmonary embolus. Mild patchy right-sided atelectasis is noted. The lungs are otherwise grossly clear. There is no evidence of pleural effusion or pneumothorax. No masses are identified; no abnormal focal contrast enhancement is seen. Scattered mediastinal  nodes remain normal in size. No pericardial effusion is identified. Scattered calcification is noted along the thoracic aorta. The great vessels are grossly unremarkable. No axillary lymphadenopathy is seen. The thyroid gland is unremarkable in appearance. The visualized portions of the liver and spleen are unremarkable. No acute osseous abnormalities are seen. Review of the MIP images confirms the above findings. IMPRESSION: 1. Segmental pulmonary emboli within pulmonary arteries to the right lower lobe and left upper lobe, and smaller emboli within the pulmonary arteries to the right upper lobe and left lower lobe. CT evidence of right heart strain (RV/LV Ratio = 0.96) consistent with at least submassive (intermediate risk) PE. The presence of right heart strain has been associated with an increased risk of morbidity and mortality. Please activate Code PE by paging [phone]. 2. Mild patchy right-sided atelectasis noted. Lungs otherwise clear. Critical Value/emergent results were called by telephone at the time of interpretation on 04/13/2015 at 8:31 pm to Dr. Sherwood Gambler, who verbally acknowledged these results. Electronically Signed   By: Garald Balding M.D.   On: 04/13/2015 20:31   Dg Hip Unilat With Pelvis 2-3 Views Left  04/13/2015  CLINICAL DATA:  Status post fall. Concern for left hip injury. Initial encounter. EXAM: DG HIP (WITH OR WITHOUT PELVIS) 2-3V LEFT COMPARISON:  None. FINDINGS: There is no evidence of fracture or dislocation. Both femoral heads are seated normally within their respective acetabula. The proximal left femur appears intact. Mild sclerotic  change is noted at the left sacroiliac joint. The patient is status post lumbar spinal fusion at L4-L5. The visualized bowel gas pattern is grossly unremarkable in appearance. IMPRESSION: No evidence of fracture or dislocation. Electronically Signed   By: Garald Balding M.D.   On: 04/13/2015 18:29    Assessment/Plan  Physical deconditioning Will have her work with physical therapy and occupational therapy team to help with gait training and muscle strengthening exercises.fall precautions. Skin care. Encourage to be out of bed. She will need to continue therapy at the other SNF as well.  Acute pulmonary embolism Breathing currently stable, off oxygen. Continue xarelto 15 mg bid until 05/07/15 and start 20 mg daily from 05/08/15. Monitor respiratory status  Diastolic chf No signs of fluid overload. continue lasix 40 mg daily, metoprolol 12.5 mg bid and kcl supplement. Monitor bmp.   Hyperlipidemia Continue lipitor 80 mg daily  Gout No flare up, continue allopurinol 300 mg daily  Chronic depression Stable, continue pamelor 25 mg daily  Insomnia On prn trazodone order on discharge from hospital, no changes made  Dementia To provide assistance with ADLs as needed. Fall and pressure ulcer prophylaxis  Hypokalemia Continue kcl supplement  HTN Stable bp, continue metoprolol 12.5 mg bid  Chronic low back pain Continue tramadol 50 mg 1-2 tab q4h prn pain and monitor   Goals of care: short term rehabilitation. Being transferred to another facility/SNF to continue STR for now. Continue current medication regimen and to be seen by MD at the facility.  Family/ staff Communication: reviewed care plan with patient, her sone and nursing supervisor    Blanchie Serve, MD  [facility] [phone] (Monday-Friday 8 am - 5 pm) [phone] (afterhours)

## 2015-04-24 DIAGNOSIS — E440 Moderate protein-calorie malnutrition: Secondary | ICD-10-CM | POA: Diagnosis not present

## 2015-04-24 DIAGNOSIS — G309 Alzheimer's disease, unspecified: Secondary | ICD-10-CM | POA: Diagnosis not present

## 2015-04-24 DIAGNOSIS — I2699 Other pulmonary embolism without acute cor pulmonale: Secondary | ICD-10-CM | POA: Diagnosis not present

## 2015-04-24 DIAGNOSIS — M170 Bilateral primary osteoarthritis of knee: Secondary | ICD-10-CM | POA: Diagnosis not present

## 2015-04-24 DIAGNOSIS — I5031 Acute diastolic (congestive) heart failure: Secondary | ICD-10-CM | POA: Diagnosis not present

## 2015-04-24 DIAGNOSIS — R5381 Other malaise: Secondary | ICD-10-CM | POA: Diagnosis not present

## 2015-04-29 DIAGNOSIS — L89312 Pressure ulcer of right buttock, stage 2: Secondary | ICD-10-CM | POA: Diagnosis not present

## 2015-05-06 DIAGNOSIS — L89312 Pressure ulcer of right buttock, stage 2: Secondary | ICD-10-CM | POA: Diagnosis not present

## 2015-05-18 DIAGNOSIS — K900 Celiac disease: Secondary | ICD-10-CM | POA: Diagnosis not present

## 2015-05-18 DIAGNOSIS — R600 Localized edema: Secondary | ICD-10-CM | POA: Diagnosis not present

## 2015-05-18 DIAGNOSIS — I2782 Chronic pulmonary embolism: Secondary | ICD-10-CM | POA: Diagnosis not present

## 2015-05-29 ENCOUNTER — Encounter: Payer: Self-pay | Admitting: Gastroenterology

## 2015-06-13 DIAGNOSIS — F0390 Unspecified dementia without behavioral disturbance: Secondary | ICD-10-CM | POA: Diagnosis not present

## 2015-06-13 DIAGNOSIS — Z0000 Encounter for general adult medical examination without abnormal findings: Secondary | ICD-10-CM | POA: Diagnosis not present

## 2015-06-14 DIAGNOSIS — M170 Bilateral primary osteoarthritis of knee: Secondary | ICD-10-CM | POA: Diagnosis not present

## 2015-06-14 DIAGNOSIS — Z9181 History of falling: Secondary | ICD-10-CM | POA: Diagnosis not present

## 2015-06-14 DIAGNOSIS — G309 Alzheimer's disease, unspecified: Secondary | ICD-10-CM | POA: Diagnosis not present

## 2015-06-14 DIAGNOSIS — I2699 Other pulmonary embolism without acute cor pulmonale: Secondary | ICD-10-CM | POA: Diagnosis not present

## 2015-06-14 DIAGNOSIS — I130 Hypertensive heart and chronic kidney disease with heart failure and stage 1 through stage 4 chronic kidney disease, or unspecified chronic kidney disease: Secondary | ICD-10-CM | POA: Diagnosis not present

## 2015-06-14 DIAGNOSIS — I5032 Chronic diastolic (congestive) heart failure: Secondary | ICD-10-CM | POA: Diagnosis not present

## 2015-06-14 DIAGNOSIS — Z7901 Long term (current) use of anticoagulants: Secondary | ICD-10-CM | POA: Diagnosis not present

## 2015-06-14 DIAGNOSIS — N183 Chronic kidney disease, stage 3 (moderate): Secondary | ICD-10-CM | POA: Diagnosis not present

## 2015-06-14 DIAGNOSIS — Z8673 Personal history of transient ischemic attack (TIA), and cerebral infarction without residual deficits: Secondary | ICD-10-CM | POA: Diagnosis not present

## 2015-06-14 DIAGNOSIS — F0280 Dementia in other diseases classified elsewhere without behavioral disturbance: Secondary | ICD-10-CM | POA: Diagnosis not present

## 2015-06-14 DIAGNOSIS — M6281 Muscle weakness (generalized): Secondary | ICD-10-CM | POA: Diagnosis not present

## 2015-06-14 DIAGNOSIS — R2689 Other abnormalities of gait and mobility: Secondary | ICD-10-CM | POA: Diagnosis not present

## 2015-06-14 DIAGNOSIS — Z87891 Personal history of nicotine dependence: Secondary | ICD-10-CM | POA: Diagnosis not present

## 2015-06-18 DIAGNOSIS — I2699 Other pulmonary embolism without acute cor pulmonale: Secondary | ICD-10-CM | POA: Diagnosis not present

## 2015-06-18 DIAGNOSIS — N183 Chronic kidney disease, stage 3 (moderate): Secondary | ICD-10-CM | POA: Diagnosis not present

## 2015-06-18 DIAGNOSIS — R2689 Other abnormalities of gait and mobility: Secondary | ICD-10-CM | POA: Diagnosis not present

## 2015-06-18 DIAGNOSIS — I130 Hypertensive heart and chronic kidney disease with heart failure and stage 1 through stage 4 chronic kidney disease, or unspecified chronic kidney disease: Secondary | ICD-10-CM | POA: Diagnosis not present

## 2015-06-18 DIAGNOSIS — M6281 Muscle weakness (generalized): Secondary | ICD-10-CM | POA: Diagnosis not present

## 2015-06-18 DIAGNOSIS — I5032 Chronic diastolic (congestive) heart failure: Secondary | ICD-10-CM | POA: Diagnosis not present

## 2015-06-20 DIAGNOSIS — I2699 Other pulmonary embolism without acute cor pulmonale: Secondary | ICD-10-CM | POA: Diagnosis not present

## 2015-06-20 DIAGNOSIS — I5032 Chronic diastolic (congestive) heart failure: Secondary | ICD-10-CM | POA: Diagnosis not present

## 2015-06-20 DIAGNOSIS — M6281 Muscle weakness (generalized): Secondary | ICD-10-CM | POA: Diagnosis not present

## 2015-06-20 DIAGNOSIS — R2689 Other abnormalities of gait and mobility: Secondary | ICD-10-CM | POA: Diagnosis not present

## 2015-06-20 DIAGNOSIS — N183 Chronic kidney disease, stage 3 (moderate): Secondary | ICD-10-CM | POA: Diagnosis not present

## 2015-06-20 DIAGNOSIS — I130 Hypertensive heart and chronic kidney disease with heart failure and stage 1 through stage 4 chronic kidney disease, or unspecified chronic kidney disease: Secondary | ICD-10-CM | POA: Diagnosis not present

## 2015-06-21 DIAGNOSIS — N183 Chronic kidney disease, stage 3 (moderate): Secondary | ICD-10-CM | POA: Diagnosis not present

## 2015-06-21 DIAGNOSIS — M6281 Muscle weakness (generalized): Secondary | ICD-10-CM | POA: Diagnosis not present

## 2015-06-21 DIAGNOSIS — I5032 Chronic diastolic (congestive) heart failure: Secondary | ICD-10-CM | POA: Diagnosis not present

## 2015-06-21 DIAGNOSIS — R2689 Other abnormalities of gait and mobility: Secondary | ICD-10-CM | POA: Diagnosis not present

## 2015-06-21 DIAGNOSIS — I130 Hypertensive heart and chronic kidney disease with heart failure and stage 1 through stage 4 chronic kidney disease, or unspecified chronic kidney disease: Secondary | ICD-10-CM | POA: Diagnosis not present

## 2015-06-21 DIAGNOSIS — I2699 Other pulmonary embolism without acute cor pulmonale: Secondary | ICD-10-CM | POA: Diagnosis not present

## 2015-06-23 DIAGNOSIS — M6281 Muscle weakness (generalized): Secondary | ICD-10-CM | POA: Diagnosis not present

## 2015-06-23 DIAGNOSIS — R2689 Other abnormalities of gait and mobility: Secondary | ICD-10-CM | POA: Diagnosis not present

## 2015-06-23 DIAGNOSIS — I130 Hypertensive heart and chronic kidney disease with heart failure and stage 1 through stage 4 chronic kidney disease, or unspecified chronic kidney disease: Secondary | ICD-10-CM | POA: Diagnosis not present

## 2015-06-23 DIAGNOSIS — I2699 Other pulmonary embolism without acute cor pulmonale: Secondary | ICD-10-CM | POA: Diagnosis not present

## 2015-06-23 DIAGNOSIS — N183 Chronic kidney disease, stage 3 (moderate): Secondary | ICD-10-CM | POA: Diagnosis not present

## 2015-06-23 DIAGNOSIS — I5032 Chronic diastolic (congestive) heart failure: Secondary | ICD-10-CM | POA: Diagnosis not present

## 2015-06-24 DIAGNOSIS — I5032 Chronic diastolic (congestive) heart failure: Secondary | ICD-10-CM | POA: Diagnosis not present

## 2015-06-24 DIAGNOSIS — I130 Hypertensive heart and chronic kidney disease with heart failure and stage 1 through stage 4 chronic kidney disease, or unspecified chronic kidney disease: Secondary | ICD-10-CM | POA: Diagnosis not present

## 2015-06-24 DIAGNOSIS — R2689 Other abnormalities of gait and mobility: Secondary | ICD-10-CM | POA: Diagnosis not present

## 2015-06-24 DIAGNOSIS — N183 Chronic kidney disease, stage 3 (moderate): Secondary | ICD-10-CM | POA: Diagnosis not present

## 2015-06-24 DIAGNOSIS — I2699 Other pulmonary embolism without acute cor pulmonale: Secondary | ICD-10-CM | POA: Diagnosis not present

## 2015-06-24 DIAGNOSIS — M6281 Muscle weakness (generalized): Secondary | ICD-10-CM | POA: Diagnosis not present

## 2015-06-25 DIAGNOSIS — I2699 Other pulmonary embolism without acute cor pulmonale: Secondary | ICD-10-CM | POA: Diagnosis not present

## 2015-06-25 DIAGNOSIS — M6281 Muscle weakness (generalized): Secondary | ICD-10-CM | POA: Diagnosis not present

## 2015-06-25 DIAGNOSIS — R2689 Other abnormalities of gait and mobility: Secondary | ICD-10-CM | POA: Diagnosis not present

## 2015-06-25 DIAGNOSIS — I130 Hypertensive heart and chronic kidney disease with heart failure and stage 1 through stage 4 chronic kidney disease, or unspecified chronic kidney disease: Secondary | ICD-10-CM | POA: Diagnosis not present

## 2015-06-25 DIAGNOSIS — N183 Chronic kidney disease, stage 3 (moderate): Secondary | ICD-10-CM | POA: Diagnosis not present

## 2015-06-25 DIAGNOSIS — I5032 Chronic diastolic (congestive) heart failure: Secondary | ICD-10-CM | POA: Diagnosis not present

## 2015-06-27 DIAGNOSIS — I5032 Chronic diastolic (congestive) heart failure: Secondary | ICD-10-CM | POA: Diagnosis not present

## 2015-06-27 DIAGNOSIS — R2689 Other abnormalities of gait and mobility: Secondary | ICD-10-CM | POA: Diagnosis not present

## 2015-06-27 DIAGNOSIS — I130 Hypertensive heart and chronic kidney disease with heart failure and stage 1 through stage 4 chronic kidney disease, or unspecified chronic kidney disease: Secondary | ICD-10-CM | POA: Diagnosis not present

## 2015-06-27 DIAGNOSIS — N183 Chronic kidney disease, stage 3 (moderate): Secondary | ICD-10-CM | POA: Diagnosis not present

## 2015-06-27 DIAGNOSIS — M6281 Muscle weakness (generalized): Secondary | ICD-10-CM | POA: Diagnosis not present

## 2015-06-27 DIAGNOSIS — I2699 Other pulmonary embolism without acute cor pulmonale: Secondary | ICD-10-CM | POA: Diagnosis not present

## 2015-06-30 DIAGNOSIS — I130 Hypertensive heart and chronic kidney disease with heart failure and stage 1 through stage 4 chronic kidney disease, or unspecified chronic kidney disease: Secondary | ICD-10-CM | POA: Diagnosis not present

## 2015-06-30 DIAGNOSIS — N183 Chronic kidney disease, stage 3 (moderate): Secondary | ICD-10-CM | POA: Diagnosis not present

## 2015-06-30 DIAGNOSIS — M6281 Muscle weakness (generalized): Secondary | ICD-10-CM | POA: Diagnosis not present

## 2015-06-30 DIAGNOSIS — R2689 Other abnormalities of gait and mobility: Secondary | ICD-10-CM | POA: Diagnosis not present

## 2015-06-30 DIAGNOSIS — I2699 Other pulmonary embolism without acute cor pulmonale: Secondary | ICD-10-CM | POA: Diagnosis not present

## 2015-06-30 DIAGNOSIS — I5032 Chronic diastolic (congestive) heart failure: Secondary | ICD-10-CM | POA: Diagnosis not present

## 2015-07-01 DIAGNOSIS — I130 Hypertensive heart and chronic kidney disease with heart failure and stage 1 through stage 4 chronic kidney disease, or unspecified chronic kidney disease: Secondary | ICD-10-CM | POA: Diagnosis not present

## 2015-07-01 DIAGNOSIS — N183 Chronic kidney disease, stage 3 (moderate): Secondary | ICD-10-CM | POA: Diagnosis not present

## 2015-07-01 DIAGNOSIS — R2689 Other abnormalities of gait and mobility: Secondary | ICD-10-CM | POA: Diagnosis not present

## 2015-07-01 DIAGNOSIS — I5032 Chronic diastolic (congestive) heart failure: Secondary | ICD-10-CM | POA: Diagnosis not present

## 2015-07-01 DIAGNOSIS — M6281 Muscle weakness (generalized): Secondary | ICD-10-CM | POA: Diagnosis not present

## 2015-07-01 DIAGNOSIS — I2699 Other pulmonary embolism without acute cor pulmonale: Secondary | ICD-10-CM | POA: Diagnosis not present

## 2015-07-02 DIAGNOSIS — I2699 Other pulmonary embolism without acute cor pulmonale: Secondary | ICD-10-CM | POA: Diagnosis not present

## 2015-07-02 DIAGNOSIS — R2689 Other abnormalities of gait and mobility: Secondary | ICD-10-CM | POA: Diagnosis not present

## 2015-07-02 DIAGNOSIS — I130 Hypertensive heart and chronic kidney disease with heart failure and stage 1 through stage 4 chronic kidney disease, or unspecified chronic kidney disease: Secondary | ICD-10-CM | POA: Diagnosis not present

## 2015-07-02 DIAGNOSIS — I5032 Chronic diastolic (congestive) heart failure: Secondary | ICD-10-CM | POA: Diagnosis not present

## 2015-07-02 DIAGNOSIS — M6281 Muscle weakness (generalized): Secondary | ICD-10-CM | POA: Diagnosis not present

## 2015-07-02 DIAGNOSIS — N183 Chronic kidney disease, stage 3 (moderate): Secondary | ICD-10-CM | POA: Diagnosis not present

## 2015-07-03 DIAGNOSIS — N183 Chronic kidney disease, stage 3 (moderate): Secondary | ICD-10-CM | POA: Diagnosis not present

## 2015-07-03 DIAGNOSIS — I2699 Other pulmonary embolism without acute cor pulmonale: Secondary | ICD-10-CM | POA: Diagnosis not present

## 2015-07-03 DIAGNOSIS — M6281 Muscle weakness (generalized): Secondary | ICD-10-CM | POA: Diagnosis not present

## 2015-07-03 DIAGNOSIS — I130 Hypertensive heart and chronic kidney disease with heart failure and stage 1 through stage 4 chronic kidney disease, or unspecified chronic kidney disease: Secondary | ICD-10-CM | POA: Diagnosis not present

## 2015-07-03 DIAGNOSIS — R2689 Other abnormalities of gait and mobility: Secondary | ICD-10-CM | POA: Diagnosis not present

## 2015-07-03 DIAGNOSIS — I5032 Chronic diastolic (congestive) heart failure: Secondary | ICD-10-CM | POA: Diagnosis not present

## 2015-07-04 DIAGNOSIS — N183 Chronic kidney disease, stage 3 (moderate): Secondary | ICD-10-CM | POA: Diagnosis not present

## 2015-07-04 DIAGNOSIS — I5032 Chronic diastolic (congestive) heart failure: Secondary | ICD-10-CM | POA: Diagnosis not present

## 2015-07-04 DIAGNOSIS — I130 Hypertensive heart and chronic kidney disease with heart failure and stage 1 through stage 4 chronic kidney disease, or unspecified chronic kidney disease: Secondary | ICD-10-CM | POA: Diagnosis not present

## 2015-07-04 DIAGNOSIS — M6281 Muscle weakness (generalized): Secondary | ICD-10-CM | POA: Diagnosis not present

## 2015-07-04 DIAGNOSIS — R2689 Other abnormalities of gait and mobility: Secondary | ICD-10-CM | POA: Diagnosis not present

## 2015-07-04 DIAGNOSIS — I2699 Other pulmonary embolism without acute cor pulmonale: Secondary | ICD-10-CM | POA: Diagnosis not present

## 2015-07-07 DIAGNOSIS — I2699 Other pulmonary embolism without acute cor pulmonale: Secondary | ICD-10-CM | POA: Diagnosis not present

## 2015-07-07 DIAGNOSIS — I5032 Chronic diastolic (congestive) heart failure: Secondary | ICD-10-CM | POA: Diagnosis not present

## 2015-07-07 DIAGNOSIS — M6281 Muscle weakness (generalized): Secondary | ICD-10-CM | POA: Diagnosis not present

## 2015-07-07 DIAGNOSIS — N183 Chronic kidney disease, stage 3 (moderate): Secondary | ICD-10-CM | POA: Diagnosis not present

## 2015-07-07 DIAGNOSIS — R2689 Other abnormalities of gait and mobility: Secondary | ICD-10-CM | POA: Diagnosis not present

## 2015-07-07 DIAGNOSIS — I130 Hypertensive heart and chronic kidney disease with heart failure and stage 1 through stage 4 chronic kidney disease, or unspecified chronic kidney disease: Secondary | ICD-10-CM | POA: Diagnosis not present

## 2015-07-09 DIAGNOSIS — I5032 Chronic diastolic (congestive) heart failure: Secondary | ICD-10-CM | POA: Diagnosis not present

## 2015-07-09 DIAGNOSIS — M6281 Muscle weakness (generalized): Secondary | ICD-10-CM | POA: Diagnosis not present

## 2015-07-09 DIAGNOSIS — I2699 Other pulmonary embolism without acute cor pulmonale: Secondary | ICD-10-CM | POA: Diagnosis not present

## 2015-07-09 DIAGNOSIS — R2689 Other abnormalities of gait and mobility: Secondary | ICD-10-CM | POA: Diagnosis not present

## 2015-07-09 DIAGNOSIS — N183 Chronic kidney disease, stage 3 (moderate): Secondary | ICD-10-CM | POA: Diagnosis not present

## 2015-07-09 DIAGNOSIS — I130 Hypertensive heart and chronic kidney disease with heart failure and stage 1 through stage 4 chronic kidney disease, or unspecified chronic kidney disease: Secondary | ICD-10-CM | POA: Diagnosis not present

## 2015-07-10 DIAGNOSIS — R2689 Other abnormalities of gait and mobility: Secondary | ICD-10-CM | POA: Diagnosis not present

## 2015-07-10 DIAGNOSIS — I5032 Chronic diastolic (congestive) heart failure: Secondary | ICD-10-CM | POA: Diagnosis not present

## 2015-07-10 DIAGNOSIS — I130 Hypertensive heart and chronic kidney disease with heart failure and stage 1 through stage 4 chronic kidney disease, or unspecified chronic kidney disease: Secondary | ICD-10-CM | POA: Diagnosis not present

## 2015-07-10 DIAGNOSIS — I2699 Other pulmonary embolism without acute cor pulmonale: Secondary | ICD-10-CM | POA: Diagnosis not present

## 2015-07-10 DIAGNOSIS — N183 Chronic kidney disease, stage 3 (moderate): Secondary | ICD-10-CM | POA: Diagnosis not present

## 2015-07-10 DIAGNOSIS — M6281 Muscle weakness (generalized): Secondary | ICD-10-CM | POA: Diagnosis not present

## 2015-07-11 DIAGNOSIS — I5032 Chronic diastolic (congestive) heart failure: Secondary | ICD-10-CM | POA: Diagnosis not present

## 2015-07-11 DIAGNOSIS — M6281 Muscle weakness (generalized): Secondary | ICD-10-CM | POA: Diagnosis not present

## 2015-07-11 DIAGNOSIS — N183 Chronic kidney disease, stage 3 (moderate): Secondary | ICD-10-CM | POA: Diagnosis not present

## 2015-07-11 DIAGNOSIS — I2699 Other pulmonary embolism without acute cor pulmonale: Secondary | ICD-10-CM | POA: Diagnosis not present

## 2015-07-11 DIAGNOSIS — R2689 Other abnormalities of gait and mobility: Secondary | ICD-10-CM | POA: Diagnosis not present

## 2015-07-11 DIAGNOSIS — I130 Hypertensive heart and chronic kidney disease with heart failure and stage 1 through stage 4 chronic kidney disease, or unspecified chronic kidney disease: Secondary | ICD-10-CM | POA: Diagnosis not present

## 2015-07-14 DIAGNOSIS — N183 Chronic kidney disease, stage 3 (moderate): Secondary | ICD-10-CM | POA: Diagnosis not present

## 2015-07-14 DIAGNOSIS — I130 Hypertensive heart and chronic kidney disease with heart failure and stage 1 through stage 4 chronic kidney disease, or unspecified chronic kidney disease: Secondary | ICD-10-CM | POA: Diagnosis not present

## 2015-07-14 DIAGNOSIS — M6281 Muscle weakness (generalized): Secondary | ICD-10-CM | POA: Diagnosis not present

## 2015-07-14 DIAGNOSIS — I5032 Chronic diastolic (congestive) heart failure: Secondary | ICD-10-CM | POA: Diagnosis not present

## 2015-07-14 DIAGNOSIS — R2689 Other abnormalities of gait and mobility: Secondary | ICD-10-CM | POA: Diagnosis not present

## 2015-07-14 DIAGNOSIS — I2699 Other pulmonary embolism without acute cor pulmonale: Secondary | ICD-10-CM | POA: Diagnosis not present

## 2015-07-16 DIAGNOSIS — I2699 Other pulmonary embolism without acute cor pulmonale: Secondary | ICD-10-CM | POA: Diagnosis not present

## 2015-07-16 DIAGNOSIS — R2689 Other abnormalities of gait and mobility: Secondary | ICD-10-CM | POA: Diagnosis not present

## 2015-07-16 DIAGNOSIS — N183 Chronic kidney disease, stage 3 (moderate): Secondary | ICD-10-CM | POA: Diagnosis not present

## 2015-07-16 DIAGNOSIS — I5032 Chronic diastolic (congestive) heart failure: Secondary | ICD-10-CM | POA: Diagnosis not present

## 2015-07-16 DIAGNOSIS — M6281 Muscle weakness (generalized): Secondary | ICD-10-CM | POA: Diagnosis not present

## 2015-07-16 DIAGNOSIS — I130 Hypertensive heart and chronic kidney disease with heart failure and stage 1 through stage 4 chronic kidney disease, or unspecified chronic kidney disease: Secondary | ICD-10-CM | POA: Diagnosis not present

## 2015-07-18 DIAGNOSIS — M6281 Muscle weakness (generalized): Secondary | ICD-10-CM | POA: Diagnosis not present

## 2015-07-18 DIAGNOSIS — I2699 Other pulmonary embolism without acute cor pulmonale: Secondary | ICD-10-CM | POA: Diagnosis not present

## 2015-07-18 DIAGNOSIS — N183 Chronic kidney disease, stage 3 (moderate): Secondary | ICD-10-CM | POA: Diagnosis not present

## 2015-07-18 DIAGNOSIS — I130 Hypertensive heart and chronic kidney disease with heart failure and stage 1 through stage 4 chronic kidney disease, or unspecified chronic kidney disease: Secondary | ICD-10-CM | POA: Diagnosis not present

## 2015-07-18 DIAGNOSIS — R2689 Other abnormalities of gait and mobility: Secondary | ICD-10-CM | POA: Diagnosis not present

## 2015-07-18 DIAGNOSIS — I5032 Chronic diastolic (congestive) heart failure: Secondary | ICD-10-CM | POA: Diagnosis not present

## 2015-07-21 DIAGNOSIS — R2689 Other abnormalities of gait and mobility: Secondary | ICD-10-CM | POA: Diagnosis not present

## 2015-07-21 DIAGNOSIS — I130 Hypertensive heart and chronic kidney disease with heart failure and stage 1 through stage 4 chronic kidney disease, or unspecified chronic kidney disease: Secondary | ICD-10-CM | POA: Diagnosis not present

## 2015-07-21 DIAGNOSIS — M6281 Muscle weakness (generalized): Secondary | ICD-10-CM | POA: Diagnosis not present

## 2015-07-21 DIAGNOSIS — N183 Chronic kidney disease, stage 3 (moderate): Secondary | ICD-10-CM | POA: Diagnosis not present

## 2015-07-21 DIAGNOSIS — I2699 Other pulmonary embolism without acute cor pulmonale: Secondary | ICD-10-CM | POA: Diagnosis not present

## 2015-07-21 DIAGNOSIS — I5032 Chronic diastolic (congestive) heart failure: Secondary | ICD-10-CM | POA: Diagnosis not present

## 2015-07-23 DIAGNOSIS — I130 Hypertensive heart and chronic kidney disease with heart failure and stage 1 through stage 4 chronic kidney disease, or unspecified chronic kidney disease: Secondary | ICD-10-CM | POA: Diagnosis not present

## 2015-07-23 DIAGNOSIS — I5032 Chronic diastolic (congestive) heart failure: Secondary | ICD-10-CM | POA: Diagnosis not present

## 2015-07-23 DIAGNOSIS — R2689 Other abnormalities of gait and mobility: Secondary | ICD-10-CM | POA: Diagnosis not present

## 2015-07-23 DIAGNOSIS — I2699 Other pulmonary embolism without acute cor pulmonale: Secondary | ICD-10-CM | POA: Diagnosis not present

## 2015-07-23 DIAGNOSIS — N183 Chronic kidney disease, stage 3 (moderate): Secondary | ICD-10-CM | POA: Diagnosis not present

## 2015-07-23 DIAGNOSIS — M6281 Muscle weakness (generalized): Secondary | ICD-10-CM | POA: Diagnosis not present

## 2015-07-28 DIAGNOSIS — I5032 Chronic diastolic (congestive) heart failure: Secondary | ICD-10-CM | POA: Diagnosis not present

## 2015-07-28 DIAGNOSIS — N183 Chronic kidney disease, stage 3 (moderate): Secondary | ICD-10-CM | POA: Diagnosis not present

## 2015-07-28 DIAGNOSIS — I2699 Other pulmonary embolism without acute cor pulmonale: Secondary | ICD-10-CM | POA: Diagnosis not present

## 2015-07-28 DIAGNOSIS — I130 Hypertensive heart and chronic kidney disease with heart failure and stage 1 through stage 4 chronic kidney disease, or unspecified chronic kidney disease: Secondary | ICD-10-CM | POA: Diagnosis not present

## 2015-07-28 DIAGNOSIS — M6281 Muscle weakness (generalized): Secondary | ICD-10-CM | POA: Diagnosis not present

## 2015-07-28 DIAGNOSIS — R2689 Other abnormalities of gait and mobility: Secondary | ICD-10-CM | POA: Diagnosis not present

## 2015-07-30 DIAGNOSIS — M6281 Muscle weakness (generalized): Secondary | ICD-10-CM | POA: Diagnosis not present

## 2015-07-30 DIAGNOSIS — I130 Hypertensive heart and chronic kidney disease with heart failure and stage 1 through stage 4 chronic kidney disease, or unspecified chronic kidney disease: Secondary | ICD-10-CM | POA: Diagnosis not present

## 2015-07-30 DIAGNOSIS — R2689 Other abnormalities of gait and mobility: Secondary | ICD-10-CM | POA: Diagnosis not present

## 2015-07-30 DIAGNOSIS — I5032 Chronic diastolic (congestive) heart failure: Secondary | ICD-10-CM | POA: Diagnosis not present

## 2015-07-30 DIAGNOSIS — N183 Chronic kidney disease, stage 3 (moderate): Secondary | ICD-10-CM | POA: Diagnosis not present

## 2015-07-30 DIAGNOSIS — I2699 Other pulmonary embolism without acute cor pulmonale: Secondary | ICD-10-CM | POA: Diagnosis not present

## 2015-08-04 DIAGNOSIS — I5032 Chronic diastolic (congestive) heart failure: Secondary | ICD-10-CM | POA: Diagnosis not present

## 2015-08-04 DIAGNOSIS — M6281 Muscle weakness (generalized): Secondary | ICD-10-CM | POA: Diagnosis not present

## 2015-08-04 DIAGNOSIS — I2699 Other pulmonary embolism without acute cor pulmonale: Secondary | ICD-10-CM | POA: Diagnosis not present

## 2015-08-04 DIAGNOSIS — N183 Chronic kidney disease, stage 3 (moderate): Secondary | ICD-10-CM | POA: Diagnosis not present

## 2015-08-04 DIAGNOSIS — R2689 Other abnormalities of gait and mobility: Secondary | ICD-10-CM | POA: Diagnosis not present

## 2015-08-04 DIAGNOSIS — I130 Hypertensive heart and chronic kidney disease with heart failure and stage 1 through stage 4 chronic kidney disease, or unspecified chronic kidney disease: Secondary | ICD-10-CM | POA: Diagnosis not present

## 2015-08-06 DIAGNOSIS — R2689 Other abnormalities of gait and mobility: Secondary | ICD-10-CM | POA: Diagnosis not present

## 2015-08-06 DIAGNOSIS — I2699 Other pulmonary embolism without acute cor pulmonale: Secondary | ICD-10-CM | POA: Diagnosis not present

## 2015-08-06 DIAGNOSIS — I5032 Chronic diastolic (congestive) heart failure: Secondary | ICD-10-CM | POA: Diagnosis not present

## 2015-08-06 DIAGNOSIS — N183 Chronic kidney disease, stage 3 (moderate): Secondary | ICD-10-CM | POA: Diagnosis not present

## 2015-08-06 DIAGNOSIS — I130 Hypertensive heart and chronic kidney disease with heart failure and stage 1 through stage 4 chronic kidney disease, or unspecified chronic kidney disease: Secondary | ICD-10-CM | POA: Diagnosis not present

## 2015-08-06 DIAGNOSIS — M6281 Muscle weakness (generalized): Secondary | ICD-10-CM | POA: Diagnosis not present

## 2015-08-11 DIAGNOSIS — M6281 Muscle weakness (generalized): Secondary | ICD-10-CM | POA: Diagnosis not present

## 2015-08-11 DIAGNOSIS — I5032 Chronic diastolic (congestive) heart failure: Secondary | ICD-10-CM | POA: Diagnosis not present

## 2015-08-11 DIAGNOSIS — I130 Hypertensive heart and chronic kidney disease with heart failure and stage 1 through stage 4 chronic kidney disease, or unspecified chronic kidney disease: Secondary | ICD-10-CM | POA: Diagnosis not present

## 2015-08-11 DIAGNOSIS — I2699 Other pulmonary embolism without acute cor pulmonale: Secondary | ICD-10-CM | POA: Diagnosis not present

## 2015-08-11 DIAGNOSIS — N183 Chronic kidney disease, stage 3 (moderate): Secondary | ICD-10-CM | POA: Diagnosis not present

## 2015-08-11 DIAGNOSIS — R2689 Other abnormalities of gait and mobility: Secondary | ICD-10-CM | POA: Diagnosis not present

## 2015-08-13 DIAGNOSIS — I130 Hypertensive heart and chronic kidney disease with heart failure and stage 1 through stage 4 chronic kidney disease, or unspecified chronic kidney disease: Secondary | ICD-10-CM | POA: Diagnosis not present

## 2015-08-13 DIAGNOSIS — G309 Alzheimer's disease, unspecified: Secondary | ICD-10-CM | POA: Diagnosis not present

## 2015-08-13 DIAGNOSIS — Z7901 Long term (current) use of anticoagulants: Secondary | ICD-10-CM | POA: Diagnosis not present

## 2015-08-13 DIAGNOSIS — Z87891 Personal history of nicotine dependence: Secondary | ICD-10-CM | POA: Diagnosis not present

## 2015-08-13 DIAGNOSIS — M170 Bilateral primary osteoarthritis of knee: Secondary | ICD-10-CM | POA: Diagnosis not present

## 2015-08-13 DIAGNOSIS — Z8673 Personal history of transient ischemic attack (TIA), and cerebral infarction without residual deficits: Secondary | ICD-10-CM | POA: Diagnosis not present

## 2015-08-13 DIAGNOSIS — F0280 Dementia in other diseases classified elsewhere without behavioral disturbance: Secondary | ICD-10-CM | POA: Diagnosis not present

## 2015-08-13 DIAGNOSIS — I2699 Other pulmonary embolism without acute cor pulmonale: Secondary | ICD-10-CM | POA: Diagnosis not present

## 2015-08-13 DIAGNOSIS — R2689 Other abnormalities of gait and mobility: Secondary | ICD-10-CM | POA: Diagnosis not present

## 2015-08-13 DIAGNOSIS — M6281 Muscle weakness (generalized): Secondary | ICD-10-CM | POA: Diagnosis not present

## 2015-08-13 DIAGNOSIS — Z9181 History of falling: Secondary | ICD-10-CM | POA: Diagnosis not present

## 2015-08-13 DIAGNOSIS — I5032 Chronic diastolic (congestive) heart failure: Secondary | ICD-10-CM | POA: Diagnosis not present

## 2015-08-13 DIAGNOSIS — N183 Chronic kidney disease, stage 3 (moderate): Secondary | ICD-10-CM | POA: Diagnosis not present

## 2015-08-18 DIAGNOSIS — M6281 Muscle weakness (generalized): Secondary | ICD-10-CM | POA: Diagnosis not present

## 2015-08-18 DIAGNOSIS — I5032 Chronic diastolic (congestive) heart failure: Secondary | ICD-10-CM | POA: Diagnosis not present

## 2015-08-18 DIAGNOSIS — I130 Hypertensive heart and chronic kidney disease with heart failure and stage 1 through stage 4 chronic kidney disease, or unspecified chronic kidney disease: Secondary | ICD-10-CM | POA: Diagnosis not present

## 2015-08-18 DIAGNOSIS — N183 Chronic kidney disease, stage 3 (moderate): Secondary | ICD-10-CM | POA: Diagnosis not present

## 2015-08-18 DIAGNOSIS — I2699 Other pulmonary embolism without acute cor pulmonale: Secondary | ICD-10-CM | POA: Diagnosis not present

## 2015-08-18 DIAGNOSIS — M170 Bilateral primary osteoarthritis of knee: Secondary | ICD-10-CM | POA: Diagnosis not present

## 2015-08-22 DIAGNOSIS — R531 Weakness: Secondary | ICD-10-CM | POA: Diagnosis not present

## 2015-08-26 DIAGNOSIS — I5032 Chronic diastolic (congestive) heart failure: Secondary | ICD-10-CM | POA: Diagnosis not present

## 2015-08-26 DIAGNOSIS — N183 Chronic kidney disease, stage 3 (moderate): Secondary | ICD-10-CM | POA: Diagnosis not present

## 2015-08-26 DIAGNOSIS — I130 Hypertensive heart and chronic kidney disease with heart failure and stage 1 through stage 4 chronic kidney disease, or unspecified chronic kidney disease: Secondary | ICD-10-CM | POA: Diagnosis not present

## 2015-08-26 DIAGNOSIS — M6281 Muscle weakness (generalized): Secondary | ICD-10-CM | POA: Diagnosis not present

## 2015-08-26 DIAGNOSIS — M170 Bilateral primary osteoarthritis of knee: Secondary | ICD-10-CM | POA: Diagnosis not present

## 2015-08-26 DIAGNOSIS — I2699 Other pulmonary embolism without acute cor pulmonale: Secondary | ICD-10-CM | POA: Diagnosis not present

## 2015-09-01 DIAGNOSIS — N183 Chronic kidney disease, stage 3 (moderate): Secondary | ICD-10-CM | POA: Diagnosis not present

## 2015-09-01 DIAGNOSIS — I5032 Chronic diastolic (congestive) heart failure: Secondary | ICD-10-CM | POA: Diagnosis not present

## 2015-09-01 DIAGNOSIS — M170 Bilateral primary osteoarthritis of knee: Secondary | ICD-10-CM | POA: Diagnosis not present

## 2015-09-01 DIAGNOSIS — M6281 Muscle weakness (generalized): Secondary | ICD-10-CM | POA: Diagnosis not present

## 2015-09-01 DIAGNOSIS — I130 Hypertensive heart and chronic kidney disease with heart failure and stage 1 through stage 4 chronic kidney disease, or unspecified chronic kidney disease: Secondary | ICD-10-CM | POA: Diagnosis not present

## 2015-09-01 DIAGNOSIS — I2699 Other pulmonary embolism without acute cor pulmonale: Secondary | ICD-10-CM | POA: Diagnosis not present

## 2015-09-05 DIAGNOSIS — M109 Gout, unspecified: Secondary | ICD-10-CM | POA: Diagnosis not present

## 2015-09-09 DIAGNOSIS — N183 Chronic kidney disease, stage 3 (moderate): Secondary | ICD-10-CM | POA: Diagnosis not present

## 2015-09-09 DIAGNOSIS — I5032 Chronic diastolic (congestive) heart failure: Secondary | ICD-10-CM | POA: Diagnosis not present

## 2015-09-09 DIAGNOSIS — M170 Bilateral primary osteoarthritis of knee: Secondary | ICD-10-CM | POA: Diagnosis not present

## 2015-09-09 DIAGNOSIS — I130 Hypertensive heart and chronic kidney disease with heart failure and stage 1 through stage 4 chronic kidney disease, or unspecified chronic kidney disease: Secondary | ICD-10-CM | POA: Diagnosis not present

## 2015-09-09 DIAGNOSIS — M6281 Muscle weakness (generalized): Secondary | ICD-10-CM | POA: Diagnosis not present

## 2015-09-09 DIAGNOSIS — I2699 Other pulmonary embolism without acute cor pulmonale: Secondary | ICD-10-CM | POA: Diagnosis not present

## 2015-09-16 DIAGNOSIS — I130 Hypertensive heart and chronic kidney disease with heart failure and stage 1 through stage 4 chronic kidney disease, or unspecified chronic kidney disease: Secondary | ICD-10-CM | POA: Diagnosis not present

## 2015-09-16 DIAGNOSIS — I5032 Chronic diastolic (congestive) heart failure: Secondary | ICD-10-CM | POA: Diagnosis not present

## 2015-09-16 DIAGNOSIS — N183 Chronic kidney disease, stage 3 (moderate): Secondary | ICD-10-CM | POA: Diagnosis not present

## 2015-09-16 DIAGNOSIS — M170 Bilateral primary osteoarthritis of knee: Secondary | ICD-10-CM | POA: Diagnosis not present

## 2015-09-16 DIAGNOSIS — M6281 Muscle weakness (generalized): Secondary | ICD-10-CM | POA: Diagnosis not present

## 2015-09-16 DIAGNOSIS — I2699 Other pulmonary embolism without acute cor pulmonale: Secondary | ICD-10-CM | POA: Diagnosis not present

## 2015-09-29 DIAGNOSIS — F0390 Unspecified dementia without behavioral disturbance: Secondary | ICD-10-CM | POA: Diagnosis not present

## 2015-10-29 DIAGNOSIS — M25569 Pain in unspecified knee: Secondary | ICD-10-CM | POA: Diagnosis not present

## 2015-10-29 DIAGNOSIS — R52 Pain, unspecified: Secondary | ICD-10-CM | POA: Diagnosis not present

## 2015-10-30 ENCOUNTER — Emergency Department (HOSPITAL_COMMUNITY)
Admission: EM | Admit: 2015-10-30 | Discharge: 2015-10-30 | Disposition: A | Discharge status: Home / Self Care | Payer: Medicare Other | Attending: Emergency Medicine | Admitting: Emergency Medicine

## 2015-10-30 ENCOUNTER — Encounter (HOSPITAL_COMMUNITY): Payer: Self-pay | Admitting: Emergency Medicine

## 2015-10-30 DIAGNOSIS — E785 Hyperlipidemia, unspecified: Secondary | ICD-10-CM | POA: Insufficient documentation

## 2015-10-30 DIAGNOSIS — M109 Gout, unspecified: Secondary | ICD-10-CM | POA: Diagnosis not present

## 2015-10-30 DIAGNOSIS — Z8719 Personal history of other diseases of the digestive system: Secondary | ICD-10-CM | POA: Insufficient documentation

## 2015-10-30 DIAGNOSIS — M25461 Effusion, right knee: Secondary | ICD-10-CM | POA: Diagnosis not present

## 2015-10-30 DIAGNOSIS — F0390 Unspecified dementia without behavioral disturbance: Secondary | ICD-10-CM | POA: Diagnosis not present

## 2015-10-30 DIAGNOSIS — Z8673 Personal history of transient ischemic attack (TIA), and cerebral infarction without residual deficits: Secondary | ICD-10-CM | POA: Diagnosis not present

## 2015-10-30 DIAGNOSIS — E669 Obesity, unspecified: Secondary | ICD-10-CM | POA: Insufficient documentation

## 2015-10-30 DIAGNOSIS — R609 Edema, unspecified: Secondary | ICD-10-CM

## 2015-10-30 DIAGNOSIS — E7800 Pure hypercholesterolemia, unspecified: Secondary | ICD-10-CM | POA: Insufficient documentation

## 2015-10-30 DIAGNOSIS — Z87891 Personal history of nicotine dependence: Secondary | ICD-10-CM | POA: Insufficient documentation

## 2015-10-30 DIAGNOSIS — R600 Localized edema: Secondary | ICD-10-CM | POA: Insufficient documentation

## 2015-10-30 DIAGNOSIS — M25561 Pain in right knee: Secondary | ICD-10-CM | POA: Insufficient documentation

## 2015-10-30 DIAGNOSIS — Z79899 Other long term (current) drug therapy: Secondary | ICD-10-CM | POA: Insufficient documentation

## 2015-10-30 MED ORDER — PREDNISONE 20 MG PO TABS: 40.0000 mg | ORAL_TABLET | Freq: Every day | ORAL | Status: DC

## 2015-10-30 MED ORDER — OXYCODONE-ACETAMINOPHEN 5-325 MG PO TABS
1.0000 | ORAL_TABLET | Freq: Once | ORAL | Status: AC
  Administered 2015-10-30: 1 via ORAL
  Filled 2015-10-30: qty 1

## 2015-10-30 MED ORDER — OXYCODONE-ACETAMINOPHEN 5-325 MG PO TABS: 1.0000 | ORAL_TABLET | ORAL | Status: DC | PRN

## 2015-10-30 MED ORDER — PREDNISONE 20 MG PO TABS
60.0000 mg | ORAL_TABLET | Freq: Once | ORAL | Status: AC
  Administered 2015-10-30: 60 mg via ORAL
  Filled 2015-10-30: qty 3

## 2015-10-30 NOTE — ED Notes (Signed)
Pt presents from home with [facility] for RIGHT knee pain that increasingly worsened since 6pm; pt denies injury to the knee; warmth and swelling noted to RIGHT knee; pos. Pulses distally; hx of PE "months ago"

## 2015-10-30 NOTE — Discharge Instructions (Signed)
For the next three days, take two furosemide tablets (80 mg) instead of one tablet. After that, resume taking one tablet a day.   Edema Edema is an abnormal buildup of fluids in your bodytissues. Edema is somewhatdependent on gravity to pull the fluid to the lowest place in your body. That makes the condition more common in the legs and thighs (lower extremities). Painless swelling of the feet and ankles is common and becomes more likely as you get older. It is also common in looser tissues, like around your eyes.  When the affected area is squeezed, the fluid may move out of that spot and leave a dent for a few moments. This dent is called pitting.  CAUSES  There are many possible causes of edema. Eating too much salt and being on your feet or sitting for a long time can cause edema in your legs and ankles. Hot weather may make edema worse. Common medical causes of edema include:  Heart failure.  Liver disease.  Kidney disease.  Weak blood vessels in your legs.  Cancer.  An injury.  Pregnancy.  Some medications.  Obesity. SYMPTOMS  Edema is usually painless.Your skin may look swollen or shiny.  DIAGNOSIS  Your health care provider may be able to diagnose edema by asking about your medical history and doing a physical exam. You may need to have tests such as X-rays, an electrocardiogram, or blood tests to check for medical conditions that may cause edema.  TREATMENT  Edema treatment depends on the cause. If you have heart, liver, or kidney disease, you need the treatment appropriate for these conditions. General treatment may include:  Elevation of the affected body part above the level of your heart.  Compression of the affected body part. Pressure from elastic bandages or support stockings squeezes the tissues and forces fluid back into the blood vessels. This keeps fluid from entering the tissues.  Restriction of fluid and salt intake.  Use of a water pill (diuretic).  These medications are appropriate only for some types of edema. They pull fluid out of your body and make you urinate more often. This gets rid of fluid and reduces swelling, but diuretics can have side effects. Only use diuretics as directed by your health care provider. HOME CARE INSTRUCTIONS   Keep the affected body part above the level of your heart when you are lying down.   Do not sit still or stand for prolonged periods.   Do not put anything directly under your knees when lying down.  Do not wear constricting clothing or garters on your upper legs.   Exercise your legs to work the fluid back into your blood vessels. This may help the swelling go down.   Wear elastic bandages or support stockings to reduce ankle swelling as directed by your health care provider.   Eat a low-salt diet to reduce fluid if your health care provider recommends it.   Only take medicines as directed by your health care provider. SEEK MEDICAL CARE IF:   Your edema is not responding to treatment.  You have heart, liver, or kidney disease and notice symptoms of edema.  You have edema in your legs that does not improve after elevating them.   You have sudden and unexplained weight gain. SEEK IMMEDIATE MEDICAL CARE IF:   You develop shortness of breath or chest pain.   You cannot breathe when you lie down.  You develop pain, redness, or warmth in the swollen areas.  You have heart, liver, or kidney disease and suddenly get edema.  You have a fever and your symptoms suddenly get worse. MAKE SURE YOU:   Understand these instructions.  Will watch your condition.  Will get help right away if you are not doing well or get worse.   This information is not intended to replace advice given to you by your health care provider. Make sure you discuss any questions you have with your health care provider.   Document Released: 05/17/2005 Document Revised: 06/07/2014 Document Reviewed:  03/09/2013 Elsevier Interactive Patient Education 2016 [place].  Gout Gout is an inflammatory arthritis caused by a buildup of uric acid crystals in the joints. Uric acid is a chemical that is normally present in the blood. When the level of uric acid in the blood is too high it can form crystals that deposit in your joints and tissues. This causes joint redness, soreness, and swelling (inflammation). Repeat attacks are common. Over time, uric acid crystals can form into masses (tophi) near a joint, destroying bone and causing disfigurement. Gout is treatable and often preventable. CAUSES  The disease begins with elevated levels of uric acid in the blood. Uric acid is produced by your body when it breaks down a naturally found substance called purines. Certain foods you eat, such as meats and fish, contain high amounts of purines. Causes of an elevated uric acid level include:  Being passed down from parent to child (heredity).  Diseases that cause increased uric acid production (such as obesity, psoriasis, and certain cancers).  Excessive alcohol use.  Diet, especially diets rich in meat and seafood.  Medicines, including certain cancer-fighting medicines (chemotherapy), water pills (diuretics), and aspirin.  Chronic kidney disease. The kidneys are no longer able to remove uric acid well.  Problems with metabolism. Conditions strongly associated with gout include:  Obesity.  High blood pressure.  High cholesterol.  Diabetes. Not everyone with elevated uric acid levels gets gout. It is not understood why some people get gout and others do not. Surgery, joint injury, and eating too much of certain foods are some of the factors that can lead to gout attacks. SYMPTOMS   An attack of gout comes on quickly. It causes intense pain with redness, swelling, and warmth in a joint.  Fever can occur.  Often, only one joint is involved. Certain joints are more commonly involved:  Base  of the big toe.  Knee.  Ankle.  Wrist.  Finger. Without treatment, an attack usually goes away in a few days to weeks. Between attacks, you usually will not have symptoms, which is different from many other forms of arthritis. DIAGNOSIS  Your caregiver will suspect gout based on your symptoms and exam. In some cases, tests may be recommended. The tests may include:  Blood tests.  Urine tests.  X-rays.  Joint fluid exam. This exam requires a needle to remove fluid from the joint (arthrocentesis). Using a microscope, gout is confirmed when uric acid crystals are seen in the joint fluid. TREATMENT  There are two phases to gout treatment: treating the sudden onset (acute) attack and preventing attacks (prophylaxis).  Treatment of an Acute Attack.  Medicines are used. These include anti-inflammatory medicines or steroid medicines.  An injection of steroid medicine into the affected joint is sometimes necessary.  The painful joint is rested. Movement can worsen the arthritis.  You may use warm or cold treatments on painful joints, depending which works best for you.  Treatment to Prevent Attacks.  If you suffer from frequent gout attacks, your caregiver may advise preventive medicine. These medicines are started after the acute attack subsides. These medicines either help your kidneys eliminate uric acid from your body or decrease your uric acid production. You may need to stay on these medicines for a very long time.  The early phase of treatment with preventive medicine can be associated with an increase in acute gout attacks. For this reason, during the first few months of treatment, your caregiver may also advise you to take medicines usually used for acute gout treatment. Be sure you understand your caregiver's directions. Your caregiver may make several adjustments to your medicine dose before these medicines are effective.  Discuss dietary treatment with your caregiver or  dietitian. Alcohol and drinks high in sugar and fructose and foods such as meat, poultry, and seafood can increase uric acid levels. Your caregiver or dietitian can advise you on drinks and foods that should be limited. HOME CARE INSTRUCTIONS   Do not take aspirin to relieve pain. This raises uric acid levels.  Only take over-the-counter or prescription medicines for pain, discomfort, or fever as directed by your caregiver.  Rest the joint as much as possible. When in bed, keep sheets and blankets off painful areas.  Keep the affected joint raised (elevated).  Apply warm or cold treatments to painful joints. Use of warm or cold treatments depends on which works best for you.  Use crutches if the painful joint is in your leg.  Drink enough fluids to keep your urine clear or pale yellow. This helps your body get rid of uric acid. Limit alcohol, sugary drinks, and fructose drinks.  Follow your dietary instructions. Pay careful attention to the amount of protein you eat. Your daily diet should emphasize fruits, vegetables, whole grains, and fat-free or low-fat milk products. Discuss the use of coffee, vitamin C, and cherries with your caregiver or dietitian. These may be helpful in lowering uric acid levels.  Maintain a healthy body weight. SEEK MEDICAL CARE IF:   You develop diarrhea, vomiting, or any side effects from medicines.  You do not feel better in 24 hours, or you are getting worse. SEEK IMMEDIATE MEDICAL CARE IF:   Your joint becomes suddenly more tender, and you have chills or a fever. MAKE SURE YOU:   Understand these instructions.  Will watch your condition.  Will get help right away if you are not doing well or get worse.   This information is not intended to replace advice given to you by your health care provider. Make sure you discuss any questions you have with your health care provider.   Document Released: 05/14/2000 Document Revised: 06/07/2014 Document  Reviewed: 12/29/2011 Elsevier Interactive Patient Education 2016 [employer]  Prednisone tablets What is this medicine? PREDNISONE (PRED ni sone) is a corticosteroid. It is commonly used to treat inflammation of the skin, joints, lungs, and other organs. Common conditions treated include asthma, allergies, and arthritis. It is also used for other conditions, such as blood disorders and diseases of the adrenal glands. This medicine may be used for other purposes; ask your health care provider or pharmacist if you have questions. What should I tell my health care provider before I take this medicine? They need to know if you have any of these conditions: -Cushing's syndrome -diabetes -glaucoma -heart disease -high blood pressure -infection (especially a virus infection such as chickenpox, cold sores, or herpes) -kidney disease -liver disease -mental illness -myasthenia gravis -osteoporosis -seizures -  stomach or intestine problems -thyroid disease -an unusual or allergic reaction to lactose, prednisone, other medicines, foods, dyes, or preservatives -pregnant or trying to get pregnant -breast-feeding How should I use this medicine? Take this medicine by mouth with a glass of water. Follow the directions on the prescription label. Take this medicine with food. If you are taking this medicine once a day, take it in the morning. Do not take more medicine than you are told to take. Do not suddenly stop taking your medicine because you may develop a severe reaction. Your doctor will tell you how much medicine to take. If your doctor wants you to stop the medicine, the dose may be slowly lowered over time to avoid any side effects. Talk to your pediatrician regarding the use of this medicine in children. Special care may be needed. Overdosage: If you think you have taken too much of this medicine contact a poison control center or emergency room at once. NOTE: This medicine is only for you.  Do not share this medicine with others. What if I miss a dose? If you miss a dose, take it as soon as you can. If it is almost time for your next dose, talk to your doctor or health care professional. You may need to miss a dose or take an extra dose. Do not take double or extra doses without advice. What may interact with this medicine? Do not take this medicine with any of the following medications: -metyrapone -mifepristone This medicine may also interact with the following medications: -aminoglutethimide -amphotericin B -aspirin and aspirin-like medicines -barbiturates -certain medicines for diabetes, like glipizide or glyburide -cholestyramine -cholinesterase inhibitors -cyclosporine -digoxin -diuretics -ephedrine -female hormones, like estrogens and birth control pills -isoniazid -ketoconazole -NSAIDS, medicines for pain and inflammation, like ibuprofen or naproxen -phenytoin -rifampin -toxoids -vaccines -warfarin This list may not describe all possible interactions. Give your health care provider a list of all the medicines, herbs, non-prescription drugs, or dietary supplements you use. Also tell them if you smoke, drink alcohol, or use illegal drugs. Some items may interact with your medicine. What should I watch for while using this medicine? Visit your doctor or health care professional for regular checks on your progress. If you are taking this medicine over a prolonged period, carry an identification card with your name and address, the type and dose of your medicine, and your doctor's name and address. This medicine may increase your risk of getting an infection. Tell your doctor or health care professional if you are around anyone with measles or chickenpox, or if you develop sores or blisters that do not heal properly. If you are going to have surgery, tell your doctor or health care professional that you have taken this medicine within the last twelve months. Ask your  doctor or health care professional about your diet. You may need to lower the amount of salt you eat. This medicine may affect blood sugar levels. If you have diabetes, check with your doctor or health care professional before you change your diet or the dose of your diabetic medicine. What side effects may I notice from receiving this medicine? Side effects that you should report to your doctor or health care professional as soon as possible: -allergic reactions like skin rash, itching or hives, swelling of the face, lips, or tongue -changes in emotions or moods -changes in vision -depressed mood -eye pain -fever or chills, cough, sore throat, pain or difficulty passing urine -increased thirst -swelling of ankles, feet Side  effects that usually do not require medical attention (report to your doctor or health care professional if they continue or are bothersome): -confusion, excitement, restlessness -headache -nausea, vomiting -skin problems, acne, thin and shiny skin -trouble sleeping -weight gain This list may not describe all possible side effects. Call your doctor for medical advice about side effects. You may report side effects to FDA at 1-800-FDA-1088. Where should I keep my medicine? Keep out of the reach of children. Store at room temperature between 15 and 30 degrees C (59 and 86 degrees F). Protect from light. Keep container tightly closed. Throw away any unused medicine after the expiration date. NOTE: This sheet is a summary. It may not cover all possible information. If you have questions about this medicine, talk to your doctor, pharmacist, or health care provider.    2016, Elsevier/[employer] Standard. (2010-12-31 10:57:14)  Acetaminophen; Oxycodone tablets What is this medicine? ACETAMINOPHEN; OXYCODONE (a set a MEE noe fen; ox i KOE done) is a pain reliever. It is used to treat moderate to severe pain. This medicine may be used for other purposes; ask your health care  provider or pharmacist if you have questions. What should I tell my health care provider before I take this medicine? They need to know if you have any of these conditions: -brain tumor -Crohn's disease, inflammatory bowel disease, or ulcerative colitis -drug abuse or addiction -head injury -heart or circulation problems -if you often drink alcohol -kidney disease or problems going to the bathroom -liver disease -lung disease, asthma, or breathing problems -an unusual or allergic reaction to acetaminophen, oxycodone, other opioid analgesics, other medicines, foods, dyes, or preservatives -pregnant or trying to get pregnant -breast-feeding How should I use this medicine? Take this medicine by mouth with a full glass of water. Follow the directions on the prescription label. You can take it with or without food. If it upsets your stomach, take it with food. Take your medicine at regular intervals. Do not take it more often than directed. Talk to your pediatrician regarding the use of this medicine in children. Special care may be needed. Patients over 34 years old may have a stronger reaction and need a smaller dose. Overdosage: If you think you have taken too much of this medicine contact a poison control center or emergency room at once. NOTE: This medicine is only for you. Do not share this medicine with others. What if I miss a dose? If you miss a dose, take it as soon as you can. If it is almost time for your next dose, take only that dose. Do not take double or extra doses. What may interact with this medicine? -alcohol -antihistamines -barbiturates like amobarbital, butalbital, butabarbital, methohexital, pentobarbital, phenobarbital, thiopental, and secobarbital -benztropine -drugs for bladder problems like solifenacin, trospium, oxybutynin, tolterodine, hyoscyamine, and methscopolamine -drugs for breathing problems like ipratropium and tiotropium -drugs for certain stomach or  intestine problems like propantheline, homatropine methylbromide, glycopyrrolate, atropine, belladonna, and dicyclomine -general anesthetics like etomidate, ketamine, nitrous oxide, propofol, desflurane, enflurane, halothane, isoflurane, and sevoflurane -medicines for depression, anxiety, or psychotic disturbances -medicines for sleep -muscle relaxants -naltrexone -narcotic medicines (opiates) for pain -phenothiazines like perphenazine, thioridazine, chlorpromazine, mesoridazine, fluphenazine, prochlorperazine, promazine, and trifluoperazine -scopolamine -tramadol -trihexyphenidyl This list may not describe all possible interactions. Give your health care provider a list of all the medicines, herbs, non-prescription drugs, or dietary supplements you use. Also tell them if you smoke, drink alcohol, or use illegal drugs. Some items may interact with your medicine. What  should I watch for while using this medicine? Tell your doctor or health care professional if your pain does not go away, if it gets worse, or if you have new or a different type of pain. You may develop tolerance to the medicine. Tolerance means that you will need a higher dose of the medication for pain relief. Tolerance is normal and is expected if you take this medicine for a long time. Do not suddenly stop taking your medicine because you may develop a severe reaction. Your body becomes used to the medicine. This does NOT mean you are addicted. Addiction is a behavior related to getting and using a drug for a non-medical reason. If you have pain, you have a medical reason to take pain medicine. Your doctor will tell you how much medicine to take. If your doctor wants you to stop the medicine, the dose will be slowly lowered over time to avoid any side effects. You may get drowsy or dizzy. Do not drive, use machinery, or do anything that needs mental alertness until you know how this medicine affects you. Do not stand or sit up  quickly, especially if you are an older patient. This reduces the risk of dizzy or fainting spells. Alcohol may interfere with the effect of this medicine. Avoid alcoholic drinks. There are different types of narcotic medicines (opiates) for pain. If you take more than one type at the same time, you may have more side effects. Give your health care provider a list of all medicines you use. Your doctor will tell you how much medicine to take. Do not take more medicine than directed. Call emergency for help if you have problems breathing. The medicine will cause constipation. Try to have a bowel movement at least every 2 to 3 days. If you do not have a bowel movement for 3 days, call your doctor or health care professional. Do not take Tylenol (acetaminophen) or medicines that have acetaminophen with this medicine. Too much acetaminophen can be very dangerous. Many nonprescription medicines contain acetaminophen. Always read the labels carefully to avoid taking more acetaminophen. What side effects may I notice from receiving this medicine? Side effects that you should report to your doctor or health care professional as soon as possible: -allergic reactions like skin rash, itching or hives, swelling of the face, lips, or tongue -breathing difficulties, wheezing -confusion -light headedness or fainting spells -severe stomach pain -unusually weak or tired -yellowing of the skin or the whites of the eyes Side effects that usually do not require medical attention (report to your doctor or health care professional if they continue or are bothersome): -dizziness -drowsiness -nausea -vomiting This list may not describe all possible side effects. Call your doctor for medical advice about side effects. You may report side effects to FDA at 1-800-FDA-1088. Where should I keep my medicine? Keep out of the reach of children. This medicine can be abused. Keep your medicine in a safe place to protect it from  theft. Do not share this medicine with anyone. Selling or giving away this medicine is dangerous and against the law. This medicine may cause accidental overdose and death if it taken by other adults, children, or pets. Mix any unused medicine with a substance like cat litter or coffee grounds. Then throw the medicine away in a sealed container like a sealed bag or a coffee can with a lid. Do not use the medicine after the expiration date. Store at room temperature between 20 and 25 degrees  C (68 and 77 degrees F). NOTE: This sheet is a summary. It may not cover all possible information. If you have questions about this medicine, talk to your doctor, pharmacist, or health care provider.    2016, [employer]. (2014-04-17 15:18:46)

## 2015-10-30 NOTE — ED Provider Notes (Signed)
CSN: [id number]     Arrival date & time 10/30/15  0006 History  By signing my name below, I, Higinio Plan, attest that this documentation has been prepared under the direction and in the presence of Delora Fuel, MD . Electronically Signed: Higinio Plan, Scribe. 10/30/2015. 1:01 AM.     Chief Complaint  Patient presents with  . Knee Pain   The history is provided by the patient and a relative. No language interpreter was used.   HPI Comments:  Carrie Smith is a 80 y.o. female with PMHx of arthritis, dementia, HLD, hypercholesterolemia, and GOUT who presents to the Emergency Department complaining of recurrent, gradually worsening, right knee pain that began at 6pm this evening. Pt reports associated symptom of moderate swelling. She states that she has had similar knee pain in the past but believes she is currently having a flare up of pain. She reports she has taken Tylenol to alleviate her symptoms with no relief. Pt is currently taking Allopurinol and Xaralto. Per relative in the room, pt was diagnosed with a PE in November. Pt denies any other complaints.   No alleviating factors noted.  Past Medical History  Diagnosis Date  . Stroke ([facility])   . Back pain   . Hypercholesterolemia   . Dementia   . Hyperlipidemia   . Celiac disease     a. versus ischemic colitis versus microscopic colitis per chart.  . GI bleed     a. remote GIB in setting of NSAIDs for arthritis.  . Arthritis   . Osteoporosis   . Obesity    Past Surgical History  Procedure Laterality Date  . Back surgery    . Cholecystectomy     Family History  Problem Relation Age of Onset  . Diabetes Mother   . Heart disease Mother   . Heart attack Father     heart attack, sudden massive  . Heart disease Sister     heart disease, ?blood clot   Social History  Substance Use Topics  . Smoking status: Former Smoker -- 25 years  . Smokeless tobacco: None  . Alcohol Use: No   OB History    No data available     Review  of Systems  Musculoskeletal: Positive for joint swelling and arthralgias (Knee pain).  All other systems reviewed and are negative.     Allergies  Codeine  Home Medications   Prior to Admission medications   Medication Sig Start Date End Date Taking? Authorizing Provider  allopurinol (ZYLOPRIM) 300 MG tablet TAKE 1 TABLET EVERY DAY TO PREVENT GOUT 03/01/15   Historical Provider, MD  atorvastatin (LIPITOR) 80 MG tablet Take 80 mg by mouth daily. 03/06/15   Historical Provider, MD  cimetidine (TAGAMET) 800 MG tablet Take 800 mg by mouth at bedtime.    Historical Provider, MD  furosemide (LASIX) 40 MG tablet Take 1 tablet (40 mg total) by mouth daily. 04/18/15   Domenic Polite, MD  metoprolol tartrate (LOPRESSOR) 25 MG tablet Take 0.5 tablets (12.5 mg total) by mouth 2 (two) times daily. 04/18/15   Domenic Polite, MD  nortriptyline (PAMELOR) 25 MG capsule Take 25 mg by mouth at bedtime.    Historical Provider, MD  ondansetron (ZOFRAN) 8 MG tablet Take 8 mg by mouth every 8 (eight) hours as needed for nausea or vomiting.    Historical Provider, MD  potassium chloride SA (K-DUR,KLOR-CON) 20 MEQ tablet Take 2 tablets (40 mEq total) by mouth daily. 04/18/15   Domenic Polite,  MD  Rivaroxaban (XARELTO) 15 MG TABS tablet Take 1 tablet (15 mg total) by mouth 2 (two) times daily with a meal. 04/18/15 05/07/15  Domenic Polite, MD  traMADol (ULTRAM) 50 MG tablet Take 50-100 mg by mouth every 4 (four) hours as needed for moderate pain or severe pain. Max 5 per day 02/12/15   Historical Provider, MD  traZODone (DESYREL) 50 MG tablet Take 25-50 mg by mouth at bedtime as needed for sleep.    Historical Provider, MD   Triage Vitals: BP 125/52 mmHg  Pulse 100  Temp(Src) 97.9 F (36.6 C) (Oral)  Resp 25  SpO2 94% Physical Exam  Constitutional: She is oriented to person, place, and time. She appears well-developed and well-nourished.  HENT:  Head: Normocephalic and atraumatic.  Eyes: Conjunctivae and EOM  are normal. Pupils are equal, round, and reactive to light. Right eye exhibits no discharge. Left eye exhibits no discharge.  Neck: Normal range of motion. Neck supple. No JVD present.  Cardiovascular: Normal rate, regular rhythm and normal heart sounds.   No murmur heard. Pulmonary/Chest: Effort normal and breath sounds normal. She has no wheezes. She has no rales. She exhibits no tenderness.  Abdominal: Soft. Bowel sounds are normal. She exhibits no distension and no mass. There is no tenderness.  Musculoskeletal: Normal range of motion. She exhibits edema.  Moderate diffusion present in right knee with slight warmth  No erythema  3+ pitting edema in bilateral lower legs  Mild erythema distally  Moderate venostasis changes No chords palpable  Lymphadenopathy:    She has no cervical adenopathy.  Neurological: She is alert and oriented to person, place, and time. No cranial nerve deficit. She exhibits normal muscle tone. Coordination normal.  Skin: Skin is warm and dry. No rash noted.  Psychiatric: She has a normal mood and affect. Her behavior is normal. Judgment and thought content normal.  Nursing note and vitals reviewed.   ED Course  Procedures  DIAGNOSTIC STUDIES:  Oxygen Saturation is 94% on RA, adequate by my interpretation.    COORDINATION OF CARE:  12:51 AM Discussed treatment plan, which includes Prednisone and Furosemide with pt at bedside and pt agreed to plan. Pt is advised to follow up with her PCP.    MDM   Final diagnoses:  Pain in right knee  Knee joint effusion, right  Peripheral edema    Right knee pain with effusion. This could be from osteoarthritis, possible exacerbation of gout. No evidence of septic joint. Peripheral edema which is worsening. Erythema and warmth are felt to be due to venous stasis and not cellulitis. Review of old records reveals a hospitalization last year for pulmonary embolism. However, she is appropriately anticoagulated and there  is no new focal swelling other than the knee joint, so I do not feel that venous Doppler is indicated. She is given prescription for prednisone and oxycodone-acetaminophen and is to follow-up with her primary care provider. She is also advised to double her dose of furosemide for the next 3 days to try to treat her peripheral edema.  I personally performed the services described in this documentation, which was scribed in my presence. The recorded information has been reviewed and is accurate.      Delora Fuel, MD [id number]

## 2015-11-11 DIAGNOSIS — D649 Anemia, unspecified: Secondary | ICD-10-CM | POA: Diagnosis not present

## 2015-11-11 DIAGNOSIS — M109 Gout, unspecified: Secondary | ICD-10-CM | POA: Diagnosis not present

## 2015-11-11 DIAGNOSIS — F0390 Unspecified dementia without behavioral disturbance: Secondary | ICD-10-CM | POA: Diagnosis not present

## 2015-11-11 DIAGNOSIS — E786 Lipoprotein deficiency: Secondary | ICD-10-CM | POA: Diagnosis not present

## 2015-11-11 DIAGNOSIS — M0560 Rheumatoid arthritis of unspecified site with involvement of other organs and systems: Secondary | ICD-10-CM | POA: Diagnosis not present

## 2015-11-11 DIAGNOSIS — K900 Celiac disease: Secondary | ICD-10-CM | POA: Diagnosis not present

## 2015-11-11 DIAGNOSIS — R7301 Impaired fasting glucose: Secondary | ICD-10-CM | POA: Diagnosis not present

## 2015-11-11 DIAGNOSIS — E559 Vitamin D deficiency, unspecified: Secondary | ICD-10-CM | POA: Diagnosis not present

## 2015-11-11 DIAGNOSIS — R609 Edema, unspecified: Secondary | ICD-10-CM | POA: Diagnosis not present

## 2015-11-11 DIAGNOSIS — Z049 Encounter for examination and observation for unspecified reason: Secondary | ICD-10-CM | POA: Diagnosis not present

## 2015-11-11 DIAGNOSIS — I5030 Unspecified diastolic (congestive) heart failure: Secondary | ICD-10-CM | POA: Diagnosis not present

## 2015-11-11 DIAGNOSIS — M899 Disorder of bone, unspecified: Secondary | ICD-10-CM | POA: Diagnosis not present

## 2015-11-11 DIAGNOSIS — R05 Cough: Secondary | ICD-10-CM | POA: Diagnosis not present

## 2015-12-04 DIAGNOSIS — R4182 Altered mental status, unspecified: Secondary | ICD-10-CM | POA: Diagnosis not present

## 2015-12-04 DIAGNOSIS — D649 Anemia, unspecified: Secondary | ICD-10-CM | POA: Diagnosis not present

## 2015-12-11 DIAGNOSIS — K625 Hemorrhage of anus and rectum: Secondary | ICD-10-CM | POA: Diagnosis not present

## 2015-12-11 DIAGNOSIS — K5730 Diverticulosis of large intestine without perforation or abscess without bleeding: Secondary | ICD-10-CM | POA: Diagnosis not present

## 2015-12-11 DIAGNOSIS — D509 Iron deficiency anemia, unspecified: Secondary | ICD-10-CM | POA: Diagnosis not present

## 2015-12-11 DIAGNOSIS — Z86010 Personal history of colonic polyps: Secondary | ICD-10-CM | POA: Diagnosis not present

## 2015-12-11 DIAGNOSIS — K635 Polyp of colon: Secondary | ICD-10-CM | POA: Diagnosis not present

## 2015-12-11 DIAGNOSIS — K643 Fourth degree hemorrhoids: Secondary | ICD-10-CM | POA: Diagnosis not present

## 2015-12-11 DIAGNOSIS — D124 Benign neoplasm of descending colon: Secondary | ICD-10-CM | POA: Diagnosis not present

## 2015-12-23 DIAGNOSIS — D649 Anemia, unspecified: Secondary | ICD-10-CM | POA: Diagnosis not present

## 2016-01-15 DIAGNOSIS — R3915 Urgency of urination: Secondary | ICD-10-CM | POA: Diagnosis not present

## 2016-01-15 DIAGNOSIS — R350 Frequency of micturition: Secondary | ICD-10-CM | POA: Diagnosis not present

## 2016-01-30 DIAGNOSIS — Z79899 Other long term (current) drug therapy: Secondary | ICD-10-CM | POA: Diagnosis not present

## 2016-01-30 DIAGNOSIS — Z23 Encounter for immunization: Secondary | ICD-10-CM | POA: Diagnosis not present

## 2016-01-30 DIAGNOSIS — R21 Rash and other nonspecific skin eruption: Secondary | ICD-10-CM | POA: Diagnosis not present

## 2016-02-04 DIAGNOSIS — R609 Edema, unspecified: Secondary | ICD-10-CM | POA: Diagnosis not present

## 2016-02-04 DIAGNOSIS — I509 Heart failure, unspecified: Secondary | ICD-10-CM | POA: Diagnosis not present

## 2016-02-04 DIAGNOSIS — R760 Raised antibody titer: Secondary | ICD-10-CM | POA: Diagnosis not present

## 2016-02-04 DIAGNOSIS — I5020 Unspecified systolic (congestive) heart failure: Secondary | ICD-10-CM | POA: Diagnosis not present

## 2016-02-18 ENCOUNTER — Encounter: Payer: Self-pay | Admitting: Neurology

## 2016-02-18 ENCOUNTER — Ambulatory Visit (INDEPENDENT_AMBULATORY_CARE_PROVIDER_SITE_OTHER): Payer: Medicare Other | Admitting: Neurology

## 2016-02-18 VITALS — BP 133/84 | HR 74 | Ht 59.0 in | Wt 194.5 lb

## 2016-02-18 DIAGNOSIS — M545 Low back pain, unspecified: Secondary | ICD-10-CM

## 2016-02-18 DIAGNOSIS — R269 Unspecified abnormalities of gait and mobility: Secondary | ICD-10-CM | POA: Diagnosis not present

## 2016-02-18 DIAGNOSIS — M810 Age-related osteoporosis without current pathological fracture: Secondary | ICD-10-CM

## 2016-02-18 DIAGNOSIS — G3184 Mild cognitive impairment, so stated: Secondary | ICD-10-CM

## 2016-02-18 DIAGNOSIS — G8929 Other chronic pain: Secondary | ICD-10-CM | POA: Diagnosis not present

## 2016-02-18 MED ORDER — MEMANTINE HCL 10 MG PO TABS: 10.0000 mg | ORAL_TABLET | Freq: Two times a day (BID) | ORAL | 11 refills | Status: DC

## 2016-02-18 NOTE — Progress Notes (Signed)
PATIENT: Carrie Smith DOB: 19-Mar-1936  Chief Complaint  Patient presents with  . Rm 4    Son, Carrie Smith  . New Patient (Initial Visit)    NP referral from Dr. Darron Smith d/t increasing memory loss.  . Memory Loss    MMSE: 26/30. AFT: 4 in 30 secs     HISTORICAL  Carrie Smith is a 80 years old right-handed female, accompanied by her son Carrie Smith, seen in refer by  her primary care physician Dr. Hayden Smith for evaluation of memory loss on February 18 2016  I reviewed and summarized the referring note, she had a history of hyperlipidemia, pulmonary emboli in November 2016, on chronic anticoagulation Eliquis, has allergic reaction while taking Xarelto, rash at her legs. She was diagnosed with celiac disease around 2010,   presented with bloating,diahrrea, constipation, she is now on gluten free diet. Hx of lumbar depression surgery, for low back pain.  She reported history of stroke at age 24s, could not elaborate on details  She graduated from high school, retired as Cytogeneticist at age 69, she described difficulty handling her working load at that time  She has no family history of dementia, she lives by herself at her house of 16 years, last driving was in [id number].  She is now having helper come in few hours each day check on her, her children other involving her care.    She walks with a walker since 2016, complains of bilateral knee pain, low back pain sitting watching TV most of the time, enjoying yardwork at her garden, she has good appetite, sleeps well, able to dressing herself,  Her son Carrie Smith noticed that she has gradually worsening memory loss since 2014, at that time, she forgot to pay her insurance and water bill, over the past few years, she was noted to have gradually worsening memory loss, no longer cooking because meals, much less active, patient tends to minimize her problem to age-related changes,  She had one episode of sudden onset confusion on April 13 2015, found on the floor confused, ripped off her clothes, was taken to the emergency room, was diagnosed with pulmonary emboli, with hypoxia oxygen level of 82,I personally reviewed hospital records, she had elevated troponin, elevated transaminase level, rhabdomyolysis, pressor ulcer, acute kidney injury, metabolic encephalopathy.  I personally reviewed CAT scan of the brain without contrast in November 2016,evidence of chronic cerebellum stroke, chronic left occipital infarction in supratentorium small vessel disease  REVIEW OF SYSTEMS: Full 14 system review of systems performed and notable only for as above ALLERGIES: Allergies  Allergen Reactions  . Codeine     HOME MEDICATIONS: Current Outpatient Prescriptions  Medication Sig Dispense Refill  . allopurinol (ZYLOPRIM) 300 MG tablet TAKE 1 TABLET EVERY DAY TO PREVENT GOUT  3  . atorvastatin (LIPITOR) 80 MG tablet Take 80 mg by mouth daily.  0  . divalproex (DEPAKOTE) 125 MG DR tablet   5  . ELIQUIS 5 MG TABS tablet Take 5 mg by mouth 2 (two) times daily.  2  . famotidine (PEPCID) 20 MG tablet   10  . ferrous sulfate 325 (65 FE) MG tablet Take 325 mg by mouth daily with breakfast.    . folic acid (FOLVITE) 1 MG tablet Take 1 mg by mouth daily.  3  . furosemide (LASIX) 40 MG tablet Take 1 tablet (40 mg total) by mouth daily.    . metoprolol tartrate (LOPRESSOR) 25 MG tablet Take 0.5 tablets (  12.5 mg total) by mouth 2 (two) times daily.    . nortriptyline (PAMELOR) 25 MG capsule Take 25 mg by mouth at bedtime.    . potassium chloride SA (K-DUR,KLOR-CON) 20 MEQ tablet Take 2 tablets (40 mEq total) by mouth daily.    . Rivaroxaban (XARELTO) 15 MG TABS tablet Take 1 tablet (15 mg total) by mouth 2 (two) times daily with a meal. 30 tablet 0   No current facility-administered medications for this visit.     PAST MEDICAL HISTORY: Past Medical History:  Diagnosis Date  . Arthritis   . Back pain   . Celiac disease    a. versus  ischemic colitis versus microscopic colitis per chart.  . Dementia   . GI bleed    a. remote GIB in setting of NSAIDs for arthritis.  . Hypercholesterolemia   . Hyperlipidemia   . Obesity   . Osteoporosis   . Stroke [facility])     PAST SURGICAL HISTORY: Past Surgical History:  Procedure Laterality Date  . BACK SURGERY    . CHOLECYSTECTOMY      FAMILY HISTORY: Family History  Problem Relation Age of Onset  . Heart disease Sister     heart disease, ?blood clot  . Diabetes Mother   . Heart disease Mother   . Heart attack Father     heart attack, sudden massive    SOCIAL HISTORY:  Social History   Social History  . Marital status: Divorced    Spouse name: N/A  . Number of children: 3  . Years of education: 12   Occupational History  . Retired     Engineer, structural   Social History Main Topics  . Smoking status: Former Smoker    Years: 25.00  . Smokeless tobacco: Never Used  . Alcohol use No  . Drug use: No  . Sexual activity: Not on file   Other Topics Concern  . Not on file   Social History Narrative   Lives at home alone, 3 children   Right-handed   Caffeine: 1 cup of coffee per day, occasional soda     PHYSICAL EXAM   Vitals:   02/18/16 1251  BP: 133/84  Pulse: 74  Weight: 194 lb 8 oz (88.2 kg)  Height: 4\' 11"  (1.499 m)    Not recorded      Body mass index is 39.28 kg/m.  PHYSICAL EXAMNIATION:  Gen: NAD, conversant, well nourised, obese, well groomed                     Cardiovascular: Regular rate rhythm, no peripheral edema, warm, nontender. Eyes: Conjunctivae clear without exudates or hemorrhage Neck: Supple, no carotid bruise. Pulmonary: Clear to auscultation bilaterally   NEUROLOGICAL EXAM:  MENTAL STATUS: Speech:    Speech is normal; fluent and spontaneous with normal comprehension.  Cognition:Mini-Mental Status Examination 26/30, animal naming 3     Orientation: She is not oriented to year, month,     recent and remote memory:  She missed 1/3 recall     Normal Attention span and concentration     Normal Language, naming, repeating,spontaneous speech     Fund of knowledge   CRANIAL NERVES: CN II: Visual fields are full to confrontation. Fundoscopic exam is normal with sharp discs and no vascular changes. Pupils are round equal and briskly reactive to light. CN III, IV, VI: extraocular movement are normal. No ptosis. CN V: Facial sensation is intact to pinprick in all 3 divisions  bilaterally. Corneal responses are intact.  CN VII: Face is symmetric with normal eye closure and smile. CN VIII: Hearing is normal to rubbing fingers CN IX, X: Palate elevates symmetrically. Phonation is normal. CN XI: Head turning and shoulder shrug are intact CN XII: Tongue is midline with normal movements and no atrophy.  MOTOR: There is no pronator drift of out-stretched arms. Muscle bulk and tone are normal. Muscle strength is normal.  REFLEXES: Reflexes are 2+ and symmetric at the biceps, triceps, knees, and ankles. Plantar responses are flexor.  SENSORY: Intact to light touch, pinprick, positional sensation and vibratory sensation are intact in fingers and toes.  COORDINATION: Rapid alternating movements and fine finger movements are intact. There is no dysmetria on finger-to-nose and heel-knee-shin.    GAIT/STANCE: She needs push up to get up from seated position, unsteady, antalgic cautious gait   DIAGNOSTIC DATA (LABS, IMAGING, TESTING) - I reviewed patient records, labs, notes, testing and imaging myself where available.   ASSESSMENT AND PLAN  Vickii Sader Mickelson is a 80 y.o. female   Mild cognitive impairment  Mini-Mental Status Examination today is 73/30  Evidence of previous stroke, repeat MRI of the brain  On chronic anticoagulation Eliquis now  Laboratory evaluation   Marcial Pacas, M.D. Ph.D.  [facility] [address] [place], [place] [address] Ph: [phone] Fax:  [phone]  CC: Carrie Rasmussen, MD

## 2016-02-19 ENCOUNTER — Telehealth: Payer: Self-pay

## 2016-02-19 LAB — VITAMIN B12: Vitamin B-12: 491 pg/mL (ref 211–946)

## 2016-02-19 LAB — VITAMIN D 25 HYDROXY (VIT D DEFICIENCY, FRACTURES): Vit D, 25-Hydroxy: 29 ng/mL — ABNORMAL LOW (ref 30.0–100.0)

## 2016-02-19 NOTE — Telephone Encounter (Signed)
-----   Message from Marcial Pacas, MD sent at 02/19/2016  9:33 AM EDT ----- Please call patient for slightly decreased vitamin D level, may consider over-the-counter vitamin D3 supplement 1000 units daily, vitamin B12 level was normal

## 2016-02-19 NOTE — Telephone Encounter (Signed)
I spoke to [facility], pt's POA. I advised him that pt's labs showed decreased vitamin d level and pt may consider vitamin d3 OTC 1000 units daily. I advised pt's son that pt's vitamin b12 level was normal. Pt's son asked if I could mail him a letter that describes how she should take the vitamin d3. I verified pt's address and DoB with pt's son. Will mail the letter.

## 2016-02-25 DIAGNOSIS — Z86711 Personal history of pulmonary embolism: Secondary | ICD-10-CM | POA: Diagnosis not present

## 2016-02-25 DIAGNOSIS — Z6841 Body Mass Index (BMI) 40.0 and over, adult: Secondary | ICD-10-CM | POA: Diagnosis not present

## 2016-02-25 DIAGNOSIS — R2689 Other abnormalities of gait and mobility: Secondary | ICD-10-CM | POA: Diagnosis not present

## 2016-02-25 DIAGNOSIS — E669 Obesity, unspecified: Secondary | ICD-10-CM | POA: Diagnosis not present

## 2016-02-25 DIAGNOSIS — Z8673 Personal history of transient ischemic attack (TIA), and cerebral infarction without residual deficits: Secondary | ICD-10-CM | POA: Diagnosis not present

## 2016-02-25 DIAGNOSIS — K900 Celiac disease: Secondary | ICD-10-CM | POA: Diagnosis not present

## 2016-02-25 DIAGNOSIS — M545 Low back pain: Secondary | ICD-10-CM | POA: Diagnosis not present

## 2016-02-25 DIAGNOSIS — M1990 Unspecified osteoarthritis, unspecified site: Secondary | ICD-10-CM | POA: Diagnosis not present

## 2016-02-25 DIAGNOSIS — F0390 Unspecified dementia without behavioral disturbance: Secondary | ICD-10-CM | POA: Diagnosis not present

## 2016-02-25 DIAGNOSIS — G8929 Other chronic pain: Secondary | ICD-10-CM | POA: Diagnosis not present

## 2016-02-25 DIAGNOSIS — F17211 Nicotine dependence, cigarettes, in remission: Secondary | ICD-10-CM | POA: Diagnosis not present

## 2016-02-25 DIAGNOSIS — M810 Age-related osteoporosis without current pathological fracture: Secondary | ICD-10-CM | POA: Diagnosis not present

## 2016-02-25 DIAGNOSIS — Z7901 Long term (current) use of anticoagulants: Secondary | ICD-10-CM | POA: Diagnosis not present

## 2016-02-27 ENCOUNTER — Telehealth: Payer: Self-pay | Admitting: Neurology

## 2016-02-27 NOTE — Telephone Encounter (Signed)
[facility] [phone] called requesting verbal order for PT 1 x 1, 2 x 3, 1 x 1 to work on gait, balance and tranfer.

## 2016-03-01 NOTE — Telephone Encounter (Signed)
Okay with verbal order for PT

## 2016-03-01 NOTE — Telephone Encounter (Signed)
Spoke to [place] at [facility] and provided verbal orders.

## 2016-03-04 DIAGNOSIS — M545 Low back pain: Secondary | ICD-10-CM | POA: Diagnosis not present

## 2016-03-04 DIAGNOSIS — Z6841 Body Mass Index (BMI) 40.0 and over, adult: Secondary | ICD-10-CM | POA: Diagnosis not present

## 2016-03-04 DIAGNOSIS — G8929 Other chronic pain: Secondary | ICD-10-CM | POA: Diagnosis not present

## 2016-03-04 DIAGNOSIS — E669 Obesity, unspecified: Secondary | ICD-10-CM | POA: Diagnosis not present

## 2016-03-04 DIAGNOSIS — F0390 Unspecified dementia without behavioral disturbance: Secondary | ICD-10-CM | POA: Diagnosis not present

## 2016-03-04 DIAGNOSIS — R2689 Other abnormalities of gait and mobility: Secondary | ICD-10-CM | POA: Diagnosis not present

## 2016-03-05 DIAGNOSIS — Z6841 Body Mass Index (BMI) 40.0 and over, adult: Secondary | ICD-10-CM | POA: Diagnosis not present

## 2016-03-05 DIAGNOSIS — M545 Low back pain: Secondary | ICD-10-CM | POA: Diagnosis not present

## 2016-03-05 DIAGNOSIS — R300 Dysuria: Secondary | ICD-10-CM | POA: Diagnosis not present

## 2016-03-05 DIAGNOSIS — G8929 Other chronic pain: Secondary | ICD-10-CM | POA: Diagnosis not present

## 2016-03-05 DIAGNOSIS — F0390 Unspecified dementia without behavioral disturbance: Secondary | ICD-10-CM | POA: Diagnosis not present

## 2016-03-05 DIAGNOSIS — E669 Obesity, unspecified: Secondary | ICD-10-CM | POA: Diagnosis not present

## 2016-03-05 DIAGNOSIS — R2689 Other abnormalities of gait and mobility: Secondary | ICD-10-CM | POA: Diagnosis not present

## 2016-03-05 DIAGNOSIS — R32 Unspecified urinary incontinence: Secondary | ICD-10-CM | POA: Diagnosis not present

## 2016-03-08 DIAGNOSIS — Z6841 Body Mass Index (BMI) 40.0 and over, adult: Secondary | ICD-10-CM | POA: Diagnosis not present

## 2016-03-08 DIAGNOSIS — E669 Obesity, unspecified: Secondary | ICD-10-CM | POA: Diagnosis not present

## 2016-03-08 DIAGNOSIS — F0390 Unspecified dementia without behavioral disturbance: Secondary | ICD-10-CM | POA: Diagnosis not present

## 2016-03-08 DIAGNOSIS — M545 Low back pain: Secondary | ICD-10-CM | POA: Diagnosis not present

## 2016-03-08 DIAGNOSIS — R2689 Other abnormalities of gait and mobility: Secondary | ICD-10-CM | POA: Diagnosis not present

## 2016-03-08 DIAGNOSIS — G8929 Other chronic pain: Secondary | ICD-10-CM | POA: Diagnosis not present

## 2016-03-11 DIAGNOSIS — E669 Obesity, unspecified: Secondary | ICD-10-CM | POA: Diagnosis not present

## 2016-03-11 DIAGNOSIS — G8929 Other chronic pain: Secondary | ICD-10-CM | POA: Diagnosis not present

## 2016-03-11 DIAGNOSIS — R2689 Other abnormalities of gait and mobility: Secondary | ICD-10-CM | POA: Diagnosis not present

## 2016-03-11 DIAGNOSIS — F0390 Unspecified dementia without behavioral disturbance: Secondary | ICD-10-CM | POA: Diagnosis not present

## 2016-03-11 DIAGNOSIS — M545 Low back pain: Secondary | ICD-10-CM | POA: Diagnosis not present

## 2016-03-11 DIAGNOSIS — Z6841 Body Mass Index (BMI) 40.0 and over, adult: Secondary | ICD-10-CM | POA: Diagnosis not present

## 2016-03-15 DIAGNOSIS — G8929 Other chronic pain: Secondary | ICD-10-CM | POA: Diagnosis not present

## 2016-03-15 DIAGNOSIS — E669 Obesity, unspecified: Secondary | ICD-10-CM | POA: Diagnosis not present

## 2016-03-15 DIAGNOSIS — F0390 Unspecified dementia without behavioral disturbance: Secondary | ICD-10-CM | POA: Diagnosis not present

## 2016-03-15 DIAGNOSIS — M545 Low back pain: Secondary | ICD-10-CM | POA: Diagnosis not present

## 2016-03-15 DIAGNOSIS — Z6841 Body Mass Index (BMI) 40.0 and over, adult: Secondary | ICD-10-CM | POA: Diagnosis not present

## 2016-03-15 DIAGNOSIS — R2689 Other abnormalities of gait and mobility: Secondary | ICD-10-CM | POA: Diagnosis not present

## 2016-03-18 DIAGNOSIS — G8929 Other chronic pain: Secondary | ICD-10-CM | POA: Diagnosis not present

## 2016-03-18 DIAGNOSIS — M545 Low back pain: Secondary | ICD-10-CM | POA: Diagnosis not present

## 2016-03-18 DIAGNOSIS — R2689 Other abnormalities of gait and mobility: Secondary | ICD-10-CM | POA: Diagnosis not present

## 2016-03-18 DIAGNOSIS — F0390 Unspecified dementia without behavioral disturbance: Secondary | ICD-10-CM | POA: Diagnosis not present

## 2016-03-18 DIAGNOSIS — E669 Obesity, unspecified: Secondary | ICD-10-CM | POA: Diagnosis not present

## 2016-03-18 DIAGNOSIS — Z6841 Body Mass Index (BMI) 40.0 and over, adult: Secondary | ICD-10-CM | POA: Diagnosis not present

## 2016-03-18 NOTE — Telephone Encounter (Signed)
Spoke to [facility] - he will continue PT and send over orders for signature.

## 2016-03-18 NOTE — Telephone Encounter (Signed)
Carrie Smith/[facility] [phone] called request verbal for PT 2 x 3 wks. If get VM pls leave orders

## 2016-03-22 ENCOUNTER — Other Ambulatory Visit: Payer: Self-pay | Admitting: *Deleted

## 2016-03-22 MED ORDER — MEMANTINE HCL 10 MG PO TABS: 10.0000 mg | ORAL_TABLET | Freq: Two times a day (BID) | ORAL | 3 refills | Status: DC

## 2016-03-24 DIAGNOSIS — Z6841 Body Mass Index (BMI) 40.0 and over, adult: Secondary | ICD-10-CM | POA: Diagnosis not present

## 2016-03-24 DIAGNOSIS — G8929 Other chronic pain: Secondary | ICD-10-CM | POA: Diagnosis not present

## 2016-03-24 DIAGNOSIS — E669 Obesity, unspecified: Secondary | ICD-10-CM | POA: Diagnosis not present

## 2016-03-24 DIAGNOSIS — M545 Low back pain: Secondary | ICD-10-CM | POA: Diagnosis not present

## 2016-03-24 DIAGNOSIS — R2689 Other abnormalities of gait and mobility: Secondary | ICD-10-CM | POA: Diagnosis not present

## 2016-03-24 DIAGNOSIS — F0390 Unspecified dementia without behavioral disturbance: Secondary | ICD-10-CM | POA: Diagnosis not present

## 2016-03-26 DIAGNOSIS — R2689 Other abnormalities of gait and mobility: Secondary | ICD-10-CM | POA: Diagnosis not present

## 2016-03-26 DIAGNOSIS — Z6841 Body Mass Index (BMI) 40.0 and over, adult: Secondary | ICD-10-CM | POA: Diagnosis not present

## 2016-03-26 DIAGNOSIS — F0390 Unspecified dementia without behavioral disturbance: Secondary | ICD-10-CM | POA: Diagnosis not present

## 2016-03-26 DIAGNOSIS — E669 Obesity, unspecified: Secondary | ICD-10-CM | POA: Diagnosis not present

## 2016-03-26 DIAGNOSIS — M545 Low back pain: Secondary | ICD-10-CM | POA: Diagnosis not present

## 2016-03-26 DIAGNOSIS — G8929 Other chronic pain: Secondary | ICD-10-CM | POA: Diagnosis not present

## 2016-03-30 DIAGNOSIS — Z6841 Body Mass Index (BMI) 40.0 and over, adult: Secondary | ICD-10-CM | POA: Diagnosis not present

## 2016-03-30 DIAGNOSIS — M545 Low back pain: Secondary | ICD-10-CM | POA: Diagnosis not present

## 2016-03-30 DIAGNOSIS — R2689 Other abnormalities of gait and mobility: Secondary | ICD-10-CM | POA: Diagnosis not present

## 2016-03-30 DIAGNOSIS — G8929 Other chronic pain: Secondary | ICD-10-CM | POA: Diagnosis not present

## 2016-03-30 DIAGNOSIS — F0390 Unspecified dementia without behavioral disturbance: Secondary | ICD-10-CM | POA: Diagnosis not present

## 2016-03-30 DIAGNOSIS — E669 Obesity, unspecified: Secondary | ICD-10-CM | POA: Diagnosis not present

## 2016-04-01 DIAGNOSIS — Z6841 Body Mass Index (BMI) 40.0 and over, adult: Secondary | ICD-10-CM | POA: Diagnosis not present

## 2016-04-01 DIAGNOSIS — M545 Low back pain: Secondary | ICD-10-CM | POA: Diagnosis not present

## 2016-04-01 DIAGNOSIS — G8929 Other chronic pain: Secondary | ICD-10-CM | POA: Diagnosis not present

## 2016-04-01 DIAGNOSIS — R2689 Other abnormalities of gait and mobility: Secondary | ICD-10-CM | POA: Diagnosis not present

## 2016-04-01 DIAGNOSIS — F0390 Unspecified dementia without behavioral disturbance: Secondary | ICD-10-CM | POA: Diagnosis not present

## 2016-04-01 DIAGNOSIS — E669 Obesity, unspecified: Secondary | ICD-10-CM | POA: Diagnosis not present

## 2016-04-05 DIAGNOSIS — Z6841 Body Mass Index (BMI) 40.0 and over, adult: Secondary | ICD-10-CM | POA: Diagnosis not present

## 2016-04-05 DIAGNOSIS — R2689 Other abnormalities of gait and mobility: Secondary | ICD-10-CM | POA: Diagnosis not present

## 2016-04-05 DIAGNOSIS — E669 Obesity, unspecified: Secondary | ICD-10-CM | POA: Diagnosis not present

## 2016-04-05 DIAGNOSIS — F0390 Unspecified dementia without behavioral disturbance: Secondary | ICD-10-CM | POA: Diagnosis not present

## 2016-04-05 DIAGNOSIS — G8929 Other chronic pain: Secondary | ICD-10-CM | POA: Diagnosis not present

## 2016-04-05 DIAGNOSIS — M545 Low back pain: Secondary | ICD-10-CM | POA: Diagnosis not present

## 2016-04-07 DIAGNOSIS — Z6841 Body Mass Index (BMI) 40.0 and over, adult: Secondary | ICD-10-CM | POA: Diagnosis not present

## 2016-04-07 DIAGNOSIS — F0390 Unspecified dementia without behavioral disturbance: Secondary | ICD-10-CM | POA: Diagnosis not present

## 2016-04-07 DIAGNOSIS — G8929 Other chronic pain: Secondary | ICD-10-CM | POA: Diagnosis not present

## 2016-04-07 DIAGNOSIS — M545 Low back pain: Secondary | ICD-10-CM | POA: Diagnosis not present

## 2016-04-07 DIAGNOSIS — R2689 Other abnormalities of gait and mobility: Secondary | ICD-10-CM | POA: Diagnosis not present

## 2016-04-07 DIAGNOSIS — E669 Obesity, unspecified: Secondary | ICD-10-CM | POA: Diagnosis not present

## 2016-04-26 DIAGNOSIS — R4182 Altered mental status, unspecified: Secondary | ICD-10-CM | POA: Diagnosis not present

## 2016-05-18 ENCOUNTER — Telehealth: Payer: Self-pay | Admitting: Neurology

## 2016-05-18 NOTE — Telephone Encounter (Signed)
Pt called said his mother "has it in her head" that Dr Krista Blue told her there was nothing wrong with her mentally. He said it has caused some strain between the them. He said she is very "ill" with him a lot of the time. He is requesting RN to call to talk with him prior to appt on Thursday 05/20/16.

## 2016-05-20 ENCOUNTER — Ambulatory Visit (INDEPENDENT_AMBULATORY_CARE_PROVIDER_SITE_OTHER): Payer: Medicare Other | Admitting: Neurology

## 2016-05-20 ENCOUNTER — Encounter: Payer: Self-pay | Admitting: Neurology

## 2016-05-20 VITALS — BP 147/72 | HR 79

## 2016-05-20 DIAGNOSIS — G3184 Mild cognitive impairment, so stated: Secondary | ICD-10-CM | POA: Diagnosis not present

## 2016-05-20 DIAGNOSIS — R269 Unspecified abnormalities of gait and mobility: Secondary | ICD-10-CM | POA: Diagnosis not present

## 2016-05-20 DIAGNOSIS — K900 Celiac disease: Secondary | ICD-10-CM

## 2016-05-20 DIAGNOSIS — I2699 Other pulmonary embolism without acute cor pulmonale: Secondary | ICD-10-CM

## 2016-05-20 MED ORDER — QUETIAPINE FUMARATE 25 MG PO TABS: 25.0000 mg | ORAL_TABLET | Freq: Every day | ORAL | 11 refills | Status: DC

## 2016-05-20 NOTE — Progress Notes (Signed)
PATIENT: Carrie Smith DOB: [birth year]  Chief Complaint  Patient presents with  . Memory Loss    MMSE 24/30 - 5 animals.  She is here with her son, [name], to discuss her memory.     HISTORICAL  Carrie Smith is a 80 years old right-handed female, accompanied by her son [name], seen in refer by  her primary care physician Dr. Hayden Rasmussen for evaluation of memory loss on February 18 2016  I reviewed and summarized the referring note, she had a history of hyperlipidemia, pulmonary emboli in November 2016, on chronic anticoagulation Eliquis, has allergic reaction while taking Xarelto, rash at her legs. She was diagnosed with celiac disease around 2010,   presented with bloating,diahrrea, constipation, she is now on gluten free diet. Hx of lumbar depression surgery, for low back pain.  She reported history of stroke at age 72s, could not elaborate on details  She graduated from high school, retired as Cytogeneticist at age 44, she described difficulty handling her working load at that time  She has no family history of dementia, she lives by herself at her house of 52 years, last driving was in [phone].  She is now having helper come in few hours each day check on her, her children other involving her care.    She walks with a walker since 2016, complains of bilateral knee pain, low back pain sitting watching TV most of the time, enjoying yardwork at her garden, she has good appetite, sleeps well, able to dressing herself,  Her son [name] noticed that she has gradually worsening memory loss since 2014, at that time, she forgot to pay her insurance and water bill, over the past few years, she was noted to have gradually worsening memory loss, no longer cooking because meals, much less active, patient tends to minimize her problem to age-related changes,  She had one episode of sudden onset confusion on April 13 2015, found on the floor confused, ripped off her clothes, was  taken to the emergency room, was diagnosed with pulmonary emboli, with hypoxia oxygen level of 82,I personally reviewed hospital records, she had elevated troponin, elevated transaminase level, rhabdomyolysis, pressor ulcer, acute kidney injury, metabolic encephalopathy.  I personally reviewed CAT scan of the brain without contrast in November 2016,evidence of chronic cerebellum stroke, chronic left occipital infarction in supratentorium small vessel disease  UPDATE May 20 2016: I talked with her son [name] with her power of attorney, she is not taking namenda 10mg  bid, she was noted to have worsening short term memory loss. Is very difficult for [name] to reason with his mother, she is getting more agitated, He worried about her staying at home by herself, she has neighbor come by taking care of her, has frequent urinary incontinence,  She has increased gait abnormality.  [name] works as Airline pilot,  Mrs. Patino was divorced with her husband, he is at assistant living,  [name] is trying to place his mother to assistant living/nursing home. But she seems to be against of the idea.  She used to work as Software engineer, noted to have mild memory trouble in her early 52s, she also has hallucinations, progressively getting worse.  We have personally reviewed CAT scan in November 2016, moderate ventriculomegaly, chronic left occipital stroke, periventricular small vessel disease,  Laboratory evaluation showed normal vitamin B12, low normal vitamin D 29,   REVIEW OF SYSTEMS: Full 14 system review of systems performed and notable only for as above  ALLERGIES: Allergies  Allergen Reactions  . Codeine     HOME MEDICATIONS: Current Outpatient Prescriptions  Medication Sig Dispense Refill  . allopurinol (ZYLOPRIM) 300 MG tablet TAKE 1 TABLET EVERY DAY TO PREVENT GOUT  3  . atorvastatin (LIPITOR) 80 MG tablet Take 80 mg by mouth daily.  0  . Cholecalciferol (VITAMIN D3) 1000 units CAPS Take by mouth  daily.    . divalproex (DEPAKOTE) 125 MG DR tablet   5  . ELIQUIS 5 MG TABS tablet Take 5 mg by mouth 2 (two) times daily.  2  . famotidine (PEPCID) 20 MG tablet   10  . ferrous sulfate 325 (65 FE) MG tablet Take 325 mg by mouth daily with breakfast.    . folic acid (FOLVITE) 1 MG tablet Take 1 mg by mouth daily.  3  . furosemide (LASIX) 40 MG tablet Take 1 tablet (40 mg total) by mouth daily.    . memantine (NAMENDA) 10 MG tablet Take 1 tablet (10 mg total) by mouth 2 (two) times daily. 180 tablet 3  . metoprolol tartrate (LOPRESSOR) 25 MG tablet Take 0.5 tablets (12.5 mg total) by mouth 2 (two) times daily.    . nortriptyline (PAMELOR) 25 MG capsule Take 25 mg by mouth at bedtime.    . potassium chloride SA (K-DUR,KLOR-CON) 20 MEQ tablet Take 2 tablets (40 mEq total) by mouth daily.    . Rivaroxaban (XARELTO) 15 MG TABS tablet Take 1 tablet (15 mg total) by mouth 2 (two) times daily with a meal. 30 tablet 0   No current facility-administered medications for this visit.     PAST MEDICAL HISTORY: Past Medical History:  Diagnosis Date  . Arthritis   . Back pain   . Celiac disease    a. versus ischemic colitis versus microscopic colitis per chart.  . Dementia   . GI bleed    a. remote GIB in setting of NSAIDs for arthritis.  . Hypercholesterolemia   . Hyperlipidemia   . Obesity   . Osteoporosis   . Stroke [facility])     PAST SURGICAL HISTORY: Past Surgical History:  Procedure Laterality Date  . BACK SURGERY    . CHOLECYSTECTOMY      FAMILY HISTORY: Family History  Problem Relation Age of Onset  . Heart disease Sister     heart disease, ?blood clot  . Diabetes Mother   . Heart disease Mother   . Heart attack Father     heart attack, sudden massive    SOCIAL HISTORY:  Social History   Social History  . Marital status: Divorced    Spouse name: N/A  . Number of children: 3  . Years of education: 12   Occupational History  . Retired     Engineer, structural   Social  History Main Topics  . Smoking status: Former Smoker    Years: 25.00  . Smokeless tobacco: Never Used  . Alcohol use No  . Drug use: No  . Sexual activity: Not on file   Other Topics Concern  . Not on file   Social History Narrative   Lives at home alone, 3 children   Right-handed   Caffeine: 1 cup of coffee per day, occasional soda     PHYSICAL EXAM   Vitals:   05/20/16 1153  BP: (!) 147/72  Pulse: 79    Not recorded      There is no height or weight on file to calculate BMI.  PHYSICAL EXAMNIATION:  Gen: NAD, conversant, well nourised, obese, well groomed                     Cardiovascular: Regular rate rhythm, no peripheral edema, warm, nontender. Eyes: Conjunctivae clear without exudates or hemorrhage Neck: Supple, no carotid bruise. Pulmonary: Clear to auscultation bilaterally   NEUROLOGICAL EXAM:  MENTAL STATUS: Speech:    Speech is normal; fluent and spontaneous with normal comprehension.  Cognition:Mini-Mental Status Examination 24/30, animal naming 5     Orientation: She is not oriented to year, month,     recent and remote memory: She missed 3/3 recall      Attention span and concentration: She has difficulties spell world backwards     Normal Language, naming, repeating,spontaneous speech     Fund of knowledge   CRANIAL NERVES: CN II: Visual fields are full to confrontation. Fundoscopic exam is normal with sharp discs and no vascular changes. Pupils are round equal and briskly reactive to light. CN III, IV, VI: extraocular movement are normal. No ptosis. CN V: Facial sensation is intact to pinprick in all 3 divisions bilaterally. Corneal responses are intact.  CN VII: Face is symmetric with normal eye closure and smile. CN VIII: Hearing is normal to rubbing fingers CN IX, X: Palate elevates symmetrically. Phonation is normal. CN XI: Head turning and shoulder shrug are intact CN XII: Tongue is midline with normal movements and no  atrophy.  MOTOR: There is no pronator drift of out-stretched arms. Muscle bulk and tone are normal. Muscle strength is normal.  REFLEXES: Reflexes are 2+ and symmetric at the biceps, triceps, knees, and ankles. Plantar responses are flexor.  SENSORY: Intact to light touch, pinprick, positional sensation and vibratory sensation are intact in fingers and toes.  COORDINATION: Rapid alternating movements and fine finger movements are intact. There is no dysmetria on finger-to-nose and heel-knee-shin.    GAIT/STANCE: She needs push up to get up from seated position, unsteady, antalgic cautious gait   DIAGNOSTIC DATA (LABS, IMAGING, TESTING) - I reviewed patient records, labs, notes, testing and imaging myself where available.   ASSESSMENT AND PLAN  Ceren Hornbaker Heisler is a 80 y.o. female   Mild cognitive impairment  Mini-Mental Status Examination today is 24/30 Progressively worsening memory loss, significant gait abnormality,  I had long discussion with patient and her power of attorney her son [name], I have suggested assisted living placement  Face to face time was 30 minutes, greater than 50% of the time was spent in counseling and coordination of care with the patient  Marcial Pacas, M.D. Ph.D.  [facility] [address], [place], [place] [address] Ph: [phone] Fax: [phone]  CC: Hayden Rasmussen, MD

## 2016-05-26 ENCOUNTER — Telehealth: Payer: Self-pay | Admitting: *Deleted

## 2016-05-26 NOTE — Telephone Encounter (Signed)
PA for Quetiapine Fumarate approved by [employer] [id number]) through 05/20/17 - member [id number].

## 2016-07-02 DIAGNOSIS — I501 Left ventricular failure: Secondary | ICD-10-CM | POA: Diagnosis not present

## 2016-07-02 DIAGNOSIS — E785 Hyperlipidemia, unspecified: Secondary | ICD-10-CM | POA: Diagnosis not present

## 2016-07-02 DIAGNOSIS — I509 Heart failure, unspecified: Secondary | ICD-10-CM | POA: Diagnosis not present

## 2016-07-02 DIAGNOSIS — R7301 Impaired fasting glucose: Secondary | ICD-10-CM | POA: Diagnosis not present

## 2016-07-02 DIAGNOSIS — I10 Essential (primary) hypertension: Secondary | ICD-10-CM | POA: Diagnosis not present

## 2016-07-02 DIAGNOSIS — N189 Chronic kidney disease, unspecified: Secondary | ICD-10-CM | POA: Diagnosis not present

## 2016-07-02 DIAGNOSIS — L8990 Pressure ulcer of unspecified site, unspecified stage: Secondary | ICD-10-CM | POA: Diagnosis not present

## 2016-07-04 DIAGNOSIS — L89322 Pressure ulcer of left buttock, stage 2: Secondary | ICD-10-CM | POA: Diagnosis not present

## 2016-07-04 DIAGNOSIS — E669 Obesity, unspecified: Secondary | ICD-10-CM | POA: Diagnosis not present

## 2016-07-04 DIAGNOSIS — M109 Gout, unspecified: Secondary | ICD-10-CM | POA: Diagnosis not present

## 2016-07-04 DIAGNOSIS — Z86711 Personal history of pulmonary embolism: Secondary | ICD-10-CM | POA: Diagnosis not present

## 2016-07-04 DIAGNOSIS — M1991 Primary osteoarthritis, unspecified site: Secondary | ICD-10-CM | POA: Diagnosis not present

## 2016-07-04 DIAGNOSIS — K219 Gastro-esophageal reflux disease without esophagitis: Secondary | ICD-10-CM | POA: Diagnosis not present

## 2016-07-04 DIAGNOSIS — F0390 Unspecified dementia without behavioral disturbance: Secondary | ICD-10-CM | POA: Diagnosis not present

## 2016-07-04 DIAGNOSIS — Z7901 Long term (current) use of anticoagulants: Secondary | ICD-10-CM | POA: Diagnosis not present

## 2016-07-04 DIAGNOSIS — I501 Left ventricular failure: Secondary | ICD-10-CM | POA: Diagnosis not present

## 2016-07-04 DIAGNOSIS — Z6841 Body Mass Index (BMI) 40.0 and over, adult: Secondary | ICD-10-CM | POA: Diagnosis not present

## 2016-07-04 DIAGNOSIS — R32 Unspecified urinary incontinence: Secondary | ICD-10-CM | POA: Diagnosis not present

## 2016-07-04 DIAGNOSIS — I272 Pulmonary hypertension, unspecified: Secondary | ICD-10-CM | POA: Diagnosis not present

## 2016-07-04 DIAGNOSIS — Z9181 History of falling: Secondary | ICD-10-CM | POA: Diagnosis not present

## 2016-07-04 DIAGNOSIS — N189 Chronic kidney disease, unspecified: Secondary | ICD-10-CM | POA: Diagnosis not present

## 2016-07-04 DIAGNOSIS — E785 Hyperlipidemia, unspecified: Secondary | ICD-10-CM | POA: Diagnosis not present

## 2016-07-05 DIAGNOSIS — I272 Pulmonary hypertension, unspecified: Secondary | ICD-10-CM | POA: Diagnosis not present

## 2016-07-05 DIAGNOSIS — I501 Left ventricular failure: Secondary | ICD-10-CM | POA: Diagnosis not present

## 2016-07-05 DIAGNOSIS — M1991 Primary osteoarthritis, unspecified site: Secondary | ICD-10-CM | POA: Diagnosis not present

## 2016-07-05 DIAGNOSIS — F0390 Unspecified dementia without behavioral disturbance: Secondary | ICD-10-CM | POA: Diagnosis not present

## 2016-07-05 DIAGNOSIS — L89322 Pressure ulcer of left buttock, stage 2: Secondary | ICD-10-CM | POA: Diagnosis not present

## 2016-07-05 DIAGNOSIS — N189 Chronic kidney disease, unspecified: Secondary | ICD-10-CM | POA: Diagnosis not present

## 2016-07-07 DIAGNOSIS — I272 Pulmonary hypertension, unspecified: Secondary | ICD-10-CM | POA: Diagnosis not present

## 2016-07-07 DIAGNOSIS — M1991 Primary osteoarthritis, unspecified site: Secondary | ICD-10-CM | POA: Diagnosis not present

## 2016-07-07 DIAGNOSIS — L89322 Pressure ulcer of left buttock, stage 2: Secondary | ICD-10-CM | POA: Diagnosis not present

## 2016-07-07 DIAGNOSIS — I501 Left ventricular failure: Secondary | ICD-10-CM | POA: Diagnosis not present

## 2016-07-07 DIAGNOSIS — N189 Chronic kidney disease, unspecified: Secondary | ICD-10-CM | POA: Diagnosis not present

## 2016-07-07 DIAGNOSIS — F0390 Unspecified dementia without behavioral disturbance: Secondary | ICD-10-CM | POA: Diagnosis not present

## 2016-07-08 DIAGNOSIS — L89322 Pressure ulcer of left buttock, stage 2: Secondary | ICD-10-CM | POA: Diagnosis not present

## 2016-07-08 DIAGNOSIS — I272 Pulmonary hypertension, unspecified: Secondary | ICD-10-CM | POA: Diagnosis not present

## 2016-07-08 DIAGNOSIS — N189 Chronic kidney disease, unspecified: Secondary | ICD-10-CM | POA: Diagnosis not present

## 2016-07-08 DIAGNOSIS — I501 Left ventricular failure: Secondary | ICD-10-CM | POA: Diagnosis not present

## 2016-07-08 DIAGNOSIS — M1991 Primary osteoarthritis, unspecified site: Secondary | ICD-10-CM | POA: Diagnosis not present

## 2016-07-08 DIAGNOSIS — F0390 Unspecified dementia without behavioral disturbance: Secondary | ICD-10-CM | POA: Diagnosis not present

## 2016-07-13 DIAGNOSIS — M1991 Primary osteoarthritis, unspecified site: Secondary | ICD-10-CM | POA: Diagnosis not present

## 2016-07-13 DIAGNOSIS — I272 Pulmonary hypertension, unspecified: Secondary | ICD-10-CM | POA: Diagnosis not present

## 2016-07-13 DIAGNOSIS — L89322 Pressure ulcer of left buttock, stage 2: Secondary | ICD-10-CM | POA: Diagnosis not present

## 2016-07-13 DIAGNOSIS — F0390 Unspecified dementia without behavioral disturbance: Secondary | ICD-10-CM | POA: Diagnosis not present

## 2016-07-13 DIAGNOSIS — I501 Left ventricular failure: Secondary | ICD-10-CM | POA: Diagnosis not present

## 2016-07-13 DIAGNOSIS — N189 Chronic kidney disease, unspecified: Secondary | ICD-10-CM | POA: Diagnosis not present

## 2016-07-15 DIAGNOSIS — I501 Left ventricular failure: Secondary | ICD-10-CM | POA: Diagnosis not present

## 2016-07-15 DIAGNOSIS — M1991 Primary osteoarthritis, unspecified site: Secondary | ICD-10-CM | POA: Diagnosis not present

## 2016-07-15 DIAGNOSIS — L89322 Pressure ulcer of left buttock, stage 2: Secondary | ICD-10-CM | POA: Diagnosis not present

## 2016-07-15 DIAGNOSIS — F0390 Unspecified dementia without behavioral disturbance: Secondary | ICD-10-CM | POA: Diagnosis not present

## 2016-07-15 DIAGNOSIS — N189 Chronic kidney disease, unspecified: Secondary | ICD-10-CM | POA: Diagnosis not present

## 2016-07-15 DIAGNOSIS — I272 Pulmonary hypertension, unspecified: Secondary | ICD-10-CM | POA: Diagnosis not present

## 2016-07-20 DIAGNOSIS — N189 Chronic kidney disease, unspecified: Secondary | ICD-10-CM | POA: Diagnosis not present

## 2016-07-20 DIAGNOSIS — M1991 Primary osteoarthritis, unspecified site: Secondary | ICD-10-CM | POA: Diagnosis not present

## 2016-07-20 DIAGNOSIS — L89322 Pressure ulcer of left buttock, stage 2: Secondary | ICD-10-CM | POA: Diagnosis not present

## 2016-07-20 DIAGNOSIS — F0390 Unspecified dementia without behavioral disturbance: Secondary | ICD-10-CM | POA: Diagnosis not present

## 2016-07-20 DIAGNOSIS — I501 Left ventricular failure: Secondary | ICD-10-CM | POA: Diagnosis not present

## 2016-07-20 DIAGNOSIS — I272 Pulmonary hypertension, unspecified: Secondary | ICD-10-CM | POA: Diagnosis not present

## 2016-07-22 DIAGNOSIS — N189 Chronic kidney disease, unspecified: Secondary | ICD-10-CM | POA: Diagnosis not present

## 2016-07-22 DIAGNOSIS — F0390 Unspecified dementia without behavioral disturbance: Secondary | ICD-10-CM | POA: Diagnosis not present

## 2016-07-22 DIAGNOSIS — I272 Pulmonary hypertension, unspecified: Secondary | ICD-10-CM | POA: Diagnosis not present

## 2016-07-22 DIAGNOSIS — M1991 Primary osteoarthritis, unspecified site: Secondary | ICD-10-CM | POA: Diagnosis not present

## 2016-07-22 DIAGNOSIS — I501 Left ventricular failure: Secondary | ICD-10-CM | POA: Diagnosis not present

## 2016-07-22 DIAGNOSIS — L89322 Pressure ulcer of left buttock, stage 2: Secondary | ICD-10-CM | POA: Diagnosis not present

## 2016-07-26 DIAGNOSIS — Z209 Contact with and (suspected) exposure to unspecified communicable disease: Secondary | ICD-10-CM | POA: Diagnosis not present

## 2016-07-26 DIAGNOSIS — Z0000 Encounter for general adult medical examination without abnormal findings: Secondary | ICD-10-CM | POA: Diagnosis not present

## 2016-07-26 DIAGNOSIS — R300 Dysuria: Secondary | ICD-10-CM | POA: Diagnosis not present

## 2016-07-26 DIAGNOSIS — R3915 Urgency of urination: Secondary | ICD-10-CM | POA: Diagnosis not present

## 2016-07-26 DIAGNOSIS — Z111 Encounter for screening for respiratory tuberculosis: Secondary | ICD-10-CM | POA: Diagnosis not present

## 2016-07-27 DIAGNOSIS — I501 Left ventricular failure: Secondary | ICD-10-CM | POA: Diagnosis not present

## 2016-07-27 DIAGNOSIS — I272 Pulmonary hypertension, unspecified: Secondary | ICD-10-CM | POA: Diagnosis not present

## 2016-07-27 DIAGNOSIS — F0390 Unspecified dementia without behavioral disturbance: Secondary | ICD-10-CM | POA: Diagnosis not present

## 2016-07-27 DIAGNOSIS — N189 Chronic kidney disease, unspecified: Secondary | ICD-10-CM | POA: Diagnosis not present

## 2016-07-27 DIAGNOSIS — L89322 Pressure ulcer of left buttock, stage 2: Secondary | ICD-10-CM | POA: Diagnosis not present

## 2016-07-27 DIAGNOSIS — M1991 Primary osteoarthritis, unspecified site: Secondary | ICD-10-CM | POA: Diagnosis not present

## 2016-07-29 DIAGNOSIS — N189 Chronic kidney disease, unspecified: Secondary | ICD-10-CM | POA: Diagnosis not present

## 2016-07-29 DIAGNOSIS — F0390 Unspecified dementia without behavioral disturbance: Secondary | ICD-10-CM | POA: Diagnosis not present

## 2016-07-29 DIAGNOSIS — I272 Pulmonary hypertension, unspecified: Secondary | ICD-10-CM | POA: Diagnosis not present

## 2016-07-29 DIAGNOSIS — L89322 Pressure ulcer of left buttock, stage 2: Secondary | ICD-10-CM | POA: Diagnosis not present

## 2016-07-29 DIAGNOSIS — I501 Left ventricular failure: Secondary | ICD-10-CM | POA: Diagnosis not present

## 2016-07-29 DIAGNOSIS — M1991 Primary osteoarthritis, unspecified site: Secondary | ICD-10-CM | POA: Diagnosis not present

## 2016-08-03 DIAGNOSIS — Z87440 Personal history of urinary (tract) infections: Secondary | ICD-10-CM | POA: Diagnosis not present

## 2016-08-03 DIAGNOSIS — R600 Localized edema: Secondary | ICD-10-CM | POA: Diagnosis not present

## 2016-08-03 DIAGNOSIS — N189 Chronic kidney disease, unspecified: Secondary | ICD-10-CM | POA: Diagnosis not present

## 2016-08-03 DIAGNOSIS — I129 Hypertensive chronic kidney disease with stage 1 through stage 4 chronic kidney disease, or unspecified chronic kidney disease: Secondary | ICD-10-CM | POA: Diagnosis not present

## 2016-08-03 DIAGNOSIS — G309 Alzheimer's disease, unspecified: Secondary | ICD-10-CM | POA: Diagnosis not present

## 2016-08-03 DIAGNOSIS — N390 Urinary tract infection, site not specified: Secondary | ICD-10-CM | POA: Diagnosis not present

## 2016-08-03 DIAGNOSIS — Z681 Body mass index (BMI) 19 or less, adult: Secondary | ICD-10-CM | POA: Diagnosis not present

## 2016-08-05 DIAGNOSIS — E039 Hypothyroidism, unspecified: Secondary | ICD-10-CM | POA: Diagnosis not present

## 2016-08-05 DIAGNOSIS — E785 Hyperlipidemia, unspecified: Secondary | ICD-10-CM | POA: Diagnosis not present

## 2016-08-05 DIAGNOSIS — E119 Type 2 diabetes mellitus without complications: Secondary | ICD-10-CM | POA: Diagnosis not present

## 2016-08-05 DIAGNOSIS — I10 Essential (primary) hypertension: Secondary | ICD-10-CM | POA: Diagnosis not present

## 2016-08-05 DIAGNOSIS — D649 Anemia, unspecified: Secondary | ICD-10-CM | POA: Diagnosis not present

## 2016-08-05 DIAGNOSIS — E559 Vitamin D deficiency, unspecified: Secondary | ICD-10-CM | POA: Diagnosis not present

## 2016-08-18 ENCOUNTER — Ambulatory Visit: Payer: Medicare Other | Admitting: Nurse Practitioner

## 2016-08-23 DIAGNOSIS — M79675 Pain in left toe(s): Secondary | ICD-10-CM | POA: Diagnosis not present

## 2016-08-23 DIAGNOSIS — I70203 Unspecified atherosclerosis of native arteries of extremities, bilateral legs: Secondary | ICD-10-CM | POA: Diagnosis not present

## 2016-08-23 DIAGNOSIS — M79674 Pain in right toe(s): Secondary | ICD-10-CM | POA: Diagnosis not present

## 2016-08-23 DIAGNOSIS — B351 Tinea unguium: Secondary | ICD-10-CM | POA: Diagnosis not present

## 2016-08-25 DIAGNOSIS — R600 Localized edema: Secondary | ICD-10-CM | POA: Diagnosis not present

## 2016-08-25 DIAGNOSIS — R262 Difficulty in walking, not elsewhere classified: Secondary | ICD-10-CM | POA: Diagnosis not present

## 2016-08-25 DIAGNOSIS — N189 Chronic kidney disease, unspecified: Secondary | ICD-10-CM | POA: Diagnosis not present

## 2016-08-25 DIAGNOSIS — M6281 Muscle weakness (generalized): Secondary | ICD-10-CM | POA: Diagnosis not present

## 2016-08-29 DIAGNOSIS — Z9181 History of falling: Secondary | ICD-10-CM | POA: Diagnosis not present

## 2016-08-29 DIAGNOSIS — N189 Chronic kidney disease, unspecified: Secondary | ICD-10-CM | POA: Diagnosis not present

## 2016-08-29 DIAGNOSIS — M6281 Muscle weakness (generalized): Secondary | ICD-10-CM | POA: Diagnosis not present

## 2016-08-29 DIAGNOSIS — I129 Hypertensive chronic kidney disease with stage 1 through stage 4 chronic kidney disease, or unspecified chronic kidney disease: Secondary | ICD-10-CM | POA: Diagnosis not present

## 2016-08-29 DIAGNOSIS — Z86711 Personal history of pulmonary embolism: Secondary | ICD-10-CM | POA: Diagnosis not present

## 2016-08-29 DIAGNOSIS — R2681 Unsteadiness on feet: Secondary | ICD-10-CM | POA: Diagnosis not present

## 2016-08-29 DIAGNOSIS — Z7901 Long term (current) use of anticoagulants: Secondary | ICD-10-CM | POA: Diagnosis not present

## 2016-08-31 DIAGNOSIS — R2681 Unsteadiness on feet: Secondary | ICD-10-CM | POA: Diagnosis not present

## 2016-08-31 DIAGNOSIS — R54 Age-related physical debility: Secondary | ICD-10-CM | POA: Diagnosis not present

## 2016-08-31 DIAGNOSIS — M6281 Muscle weakness (generalized): Secondary | ICD-10-CM | POA: Diagnosis not present

## 2016-08-31 DIAGNOSIS — I129 Hypertensive chronic kidney disease with stage 1 through stage 4 chronic kidney disease, or unspecified chronic kidney disease: Secondary | ICD-10-CM | POA: Diagnosis not present

## 2016-08-31 DIAGNOSIS — Z86711 Personal history of pulmonary embolism: Secondary | ICD-10-CM | POA: Diagnosis not present

## 2016-08-31 DIAGNOSIS — Z7901 Long term (current) use of anticoagulants: Secondary | ICD-10-CM | POA: Diagnosis not present

## 2016-08-31 DIAGNOSIS — L89320 Pressure ulcer of left buttock, unstageable: Secondary | ICD-10-CM | POA: Diagnosis not present

## 2016-08-31 DIAGNOSIS — R269 Unspecified abnormalities of gait and mobility: Secondary | ICD-10-CM | POA: Diagnosis not present

## 2016-08-31 DIAGNOSIS — N189 Chronic kidney disease, unspecified: Secondary | ICD-10-CM | POA: Diagnosis not present

## 2016-09-01 DIAGNOSIS — I129 Hypertensive chronic kidney disease with stage 1 through stage 4 chronic kidney disease, or unspecified chronic kidney disease: Secondary | ICD-10-CM | POA: Diagnosis not present

## 2016-09-01 DIAGNOSIS — M6281 Muscle weakness (generalized): Secondary | ICD-10-CM | POA: Diagnosis not present

## 2016-09-01 DIAGNOSIS — I10 Essential (primary) hypertension: Secondary | ICD-10-CM | POA: Diagnosis not present

## 2016-09-01 DIAGNOSIS — Z86711 Personal history of pulmonary embolism: Secondary | ICD-10-CM | POA: Diagnosis not present

## 2016-09-01 DIAGNOSIS — N189 Chronic kidney disease, unspecified: Secondary | ICD-10-CM | POA: Diagnosis not present

## 2016-09-01 DIAGNOSIS — R2681 Unsteadiness on feet: Secondary | ICD-10-CM | POA: Diagnosis not present

## 2016-09-01 DIAGNOSIS — Z7901 Long term (current) use of anticoagulants: Secondary | ICD-10-CM | POA: Diagnosis not present

## 2016-09-03 DIAGNOSIS — I129 Hypertensive chronic kidney disease with stage 1 through stage 4 chronic kidney disease, or unspecified chronic kidney disease: Secondary | ICD-10-CM | POA: Diagnosis not present

## 2016-09-03 DIAGNOSIS — R2681 Unsteadiness on feet: Secondary | ICD-10-CM | POA: Diagnosis not present

## 2016-09-03 DIAGNOSIS — M6281 Muscle weakness (generalized): Secondary | ICD-10-CM | POA: Diagnosis not present

## 2016-09-03 DIAGNOSIS — Z86711 Personal history of pulmonary embolism: Secondary | ICD-10-CM | POA: Diagnosis not present

## 2016-09-03 DIAGNOSIS — N189 Chronic kidney disease, unspecified: Secondary | ICD-10-CM | POA: Diagnosis not present

## 2016-09-03 DIAGNOSIS — Z7901 Long term (current) use of anticoagulants: Secondary | ICD-10-CM | POA: Diagnosis not present

## 2016-09-07 DIAGNOSIS — Z7901 Long term (current) use of anticoagulants: Secondary | ICD-10-CM | POA: Diagnosis not present

## 2016-09-07 DIAGNOSIS — Z86711 Personal history of pulmonary embolism: Secondary | ICD-10-CM | POA: Diagnosis not present

## 2016-09-07 DIAGNOSIS — M6281 Muscle weakness (generalized): Secondary | ICD-10-CM | POA: Diagnosis not present

## 2016-09-07 DIAGNOSIS — N189 Chronic kidney disease, unspecified: Secondary | ICD-10-CM | POA: Diagnosis not present

## 2016-09-07 DIAGNOSIS — R2681 Unsteadiness on feet: Secondary | ICD-10-CM | POA: Diagnosis not present

## 2016-09-07 DIAGNOSIS — I129 Hypertensive chronic kidney disease with stage 1 through stage 4 chronic kidney disease, or unspecified chronic kidney disease: Secondary | ICD-10-CM | POA: Diagnosis not present

## 2016-09-09 DIAGNOSIS — M6281 Muscle weakness (generalized): Secondary | ICD-10-CM | POA: Diagnosis not present

## 2016-09-09 DIAGNOSIS — Z86711 Personal history of pulmonary embolism: Secondary | ICD-10-CM | POA: Diagnosis not present

## 2016-09-09 DIAGNOSIS — N189 Chronic kidney disease, unspecified: Secondary | ICD-10-CM | POA: Diagnosis not present

## 2016-09-09 DIAGNOSIS — R2681 Unsteadiness on feet: Secondary | ICD-10-CM | POA: Diagnosis not present

## 2016-09-09 DIAGNOSIS — Z7901 Long term (current) use of anticoagulants: Secondary | ICD-10-CM | POA: Diagnosis not present

## 2016-09-09 DIAGNOSIS — I129 Hypertensive chronic kidney disease with stage 1 through stage 4 chronic kidney disease, or unspecified chronic kidney disease: Secondary | ICD-10-CM | POA: Diagnosis not present

## 2016-09-10 DIAGNOSIS — N189 Chronic kidney disease, unspecified: Secondary | ICD-10-CM | POA: Diagnosis not present

## 2016-09-10 DIAGNOSIS — I129 Hypertensive chronic kidney disease with stage 1 through stage 4 chronic kidney disease, or unspecified chronic kidney disease: Secondary | ICD-10-CM | POA: Diagnosis not present

## 2016-09-10 DIAGNOSIS — M6281 Muscle weakness (generalized): Secondary | ICD-10-CM | POA: Diagnosis not present

## 2016-09-10 DIAGNOSIS — Z7901 Long term (current) use of anticoagulants: Secondary | ICD-10-CM | POA: Diagnosis not present

## 2016-09-10 DIAGNOSIS — R2681 Unsteadiness on feet: Secondary | ICD-10-CM | POA: Diagnosis not present

## 2016-09-10 DIAGNOSIS — Z86711 Personal history of pulmonary embolism: Secondary | ICD-10-CM | POA: Diagnosis not present

## 2016-09-13 DIAGNOSIS — M6281 Muscle weakness (generalized): Secondary | ICD-10-CM | POA: Diagnosis not present

## 2016-09-13 DIAGNOSIS — R2681 Unsteadiness on feet: Secondary | ICD-10-CM | POA: Diagnosis not present

## 2016-09-13 DIAGNOSIS — N189 Chronic kidney disease, unspecified: Secondary | ICD-10-CM | POA: Diagnosis not present

## 2016-09-13 DIAGNOSIS — Z86711 Personal history of pulmonary embolism: Secondary | ICD-10-CM | POA: Diagnosis not present

## 2016-09-13 DIAGNOSIS — Z7901 Long term (current) use of anticoagulants: Secondary | ICD-10-CM | POA: Diagnosis not present

## 2016-09-13 DIAGNOSIS — I129 Hypertensive chronic kidney disease with stage 1 through stage 4 chronic kidney disease, or unspecified chronic kidney disease: Secondary | ICD-10-CM | POA: Diagnosis not present

## 2016-09-15 DIAGNOSIS — Z7901 Long term (current) use of anticoagulants: Secondary | ICD-10-CM | POA: Diagnosis not present

## 2016-09-15 DIAGNOSIS — R2681 Unsteadiness on feet: Secondary | ICD-10-CM | POA: Diagnosis not present

## 2016-09-15 DIAGNOSIS — I129 Hypertensive chronic kidney disease with stage 1 through stage 4 chronic kidney disease, or unspecified chronic kidney disease: Secondary | ICD-10-CM | POA: Diagnosis not present

## 2016-09-15 DIAGNOSIS — M6281 Muscle weakness (generalized): Secondary | ICD-10-CM | POA: Diagnosis not present

## 2016-09-15 DIAGNOSIS — N189 Chronic kidney disease, unspecified: Secondary | ICD-10-CM | POA: Diagnosis not present

## 2016-09-15 DIAGNOSIS — Z86711 Personal history of pulmonary embolism: Secondary | ICD-10-CM | POA: Diagnosis not present

## 2016-09-17 DIAGNOSIS — Z86711 Personal history of pulmonary embolism: Secondary | ICD-10-CM | POA: Diagnosis not present

## 2016-09-17 DIAGNOSIS — I129 Hypertensive chronic kidney disease with stage 1 through stage 4 chronic kidney disease, or unspecified chronic kidney disease: Secondary | ICD-10-CM | POA: Diagnosis not present

## 2016-09-17 DIAGNOSIS — Z7901 Long term (current) use of anticoagulants: Secondary | ICD-10-CM | POA: Diagnosis not present

## 2016-09-17 DIAGNOSIS — N189 Chronic kidney disease, unspecified: Secondary | ICD-10-CM | POA: Diagnosis not present

## 2016-09-17 DIAGNOSIS — R2681 Unsteadiness on feet: Secondary | ICD-10-CM | POA: Diagnosis not present

## 2016-09-17 DIAGNOSIS — M6281 Muscle weakness (generalized): Secondary | ICD-10-CM | POA: Diagnosis not present

## 2016-09-20 DIAGNOSIS — Z7901 Long term (current) use of anticoagulants: Secondary | ICD-10-CM | POA: Diagnosis not present

## 2016-09-20 DIAGNOSIS — N189 Chronic kidney disease, unspecified: Secondary | ICD-10-CM | POA: Diagnosis not present

## 2016-09-20 DIAGNOSIS — I129 Hypertensive chronic kidney disease with stage 1 through stage 4 chronic kidney disease, or unspecified chronic kidney disease: Secondary | ICD-10-CM | POA: Diagnosis not present

## 2016-09-20 DIAGNOSIS — R2681 Unsteadiness on feet: Secondary | ICD-10-CM | POA: Diagnosis not present

## 2016-09-20 DIAGNOSIS — Z86711 Personal history of pulmonary embolism: Secondary | ICD-10-CM | POA: Diagnosis not present

## 2016-09-20 DIAGNOSIS — M6281 Muscle weakness (generalized): Secondary | ICD-10-CM | POA: Diagnosis not present

## 2016-09-23 DIAGNOSIS — Z7901 Long term (current) use of anticoagulants: Secondary | ICD-10-CM | POA: Diagnosis not present

## 2016-09-23 DIAGNOSIS — R2681 Unsteadiness on feet: Secondary | ICD-10-CM | POA: Diagnosis not present

## 2016-09-23 DIAGNOSIS — M6281 Muscle weakness (generalized): Secondary | ICD-10-CM | POA: Diagnosis not present

## 2016-09-23 DIAGNOSIS — N189 Chronic kidney disease, unspecified: Secondary | ICD-10-CM | POA: Diagnosis not present

## 2016-09-23 DIAGNOSIS — Z86711 Personal history of pulmonary embolism: Secondary | ICD-10-CM | POA: Diagnosis not present

## 2016-09-23 DIAGNOSIS — I129 Hypertensive chronic kidney disease with stage 1 through stage 4 chronic kidney disease, or unspecified chronic kidney disease: Secondary | ICD-10-CM | POA: Diagnosis not present

## 2016-09-24 DIAGNOSIS — R2681 Unsteadiness on feet: Secondary | ICD-10-CM | POA: Diagnosis not present

## 2016-09-24 DIAGNOSIS — Z86711 Personal history of pulmonary embolism: Secondary | ICD-10-CM | POA: Diagnosis not present

## 2016-09-24 DIAGNOSIS — I129 Hypertensive chronic kidney disease with stage 1 through stage 4 chronic kidney disease, or unspecified chronic kidney disease: Secondary | ICD-10-CM | POA: Diagnosis not present

## 2016-09-24 DIAGNOSIS — Z7901 Long term (current) use of anticoagulants: Secondary | ICD-10-CM | POA: Diagnosis not present

## 2016-09-24 DIAGNOSIS — M6281 Muscle weakness (generalized): Secondary | ICD-10-CM | POA: Diagnosis not present

## 2016-09-24 DIAGNOSIS — N189 Chronic kidney disease, unspecified: Secondary | ICD-10-CM | POA: Diagnosis not present

## 2016-09-27 DIAGNOSIS — Z86711 Personal history of pulmonary embolism: Secondary | ICD-10-CM | POA: Diagnosis not present

## 2016-09-27 DIAGNOSIS — N189 Chronic kidney disease, unspecified: Secondary | ICD-10-CM | POA: Diagnosis not present

## 2016-09-27 DIAGNOSIS — I129 Hypertensive chronic kidney disease with stage 1 through stage 4 chronic kidney disease, or unspecified chronic kidney disease: Secondary | ICD-10-CM | POA: Diagnosis not present

## 2016-09-27 DIAGNOSIS — Z7901 Long term (current) use of anticoagulants: Secondary | ICD-10-CM | POA: Diagnosis not present

## 2016-09-27 DIAGNOSIS — M6281 Muscle weakness (generalized): Secondary | ICD-10-CM | POA: Diagnosis not present

## 2016-09-27 DIAGNOSIS — R2681 Unsteadiness on feet: Secondary | ICD-10-CM | POA: Diagnosis not present

## 2016-09-28 DIAGNOSIS — R2681 Unsteadiness on feet: Secondary | ICD-10-CM | POA: Diagnosis not present

## 2016-09-28 DIAGNOSIS — N189 Chronic kidney disease, unspecified: Secondary | ICD-10-CM | POA: Diagnosis not present

## 2016-09-28 DIAGNOSIS — M6281 Muscle weakness (generalized): Secondary | ICD-10-CM | POA: Diagnosis not present

## 2016-09-28 DIAGNOSIS — I129 Hypertensive chronic kidney disease with stage 1 through stage 4 chronic kidney disease, or unspecified chronic kidney disease: Secondary | ICD-10-CM | POA: Diagnosis not present

## 2016-09-28 DIAGNOSIS — Z86711 Personal history of pulmonary embolism: Secondary | ICD-10-CM | POA: Diagnosis not present

## 2016-09-28 DIAGNOSIS — Z7901 Long term (current) use of anticoagulants: Secondary | ICD-10-CM | POA: Diagnosis not present

## 2016-09-30 DIAGNOSIS — N183 Chronic kidney disease, stage 3 (moderate): Secondary | ICD-10-CM | POA: Diagnosis not present

## 2016-09-30 DIAGNOSIS — R269 Unspecified abnormalities of gait and mobility: Secondary | ICD-10-CM | POA: Diagnosis not present

## 2016-09-30 DIAGNOSIS — R600 Localized edema: Secondary | ICD-10-CM | POA: Diagnosis not present

## 2016-09-30 DIAGNOSIS — R54 Age-related physical debility: Secondary | ICD-10-CM | POA: Diagnosis not present

## 2016-09-30 DIAGNOSIS — M6281 Muscle weakness (generalized): Secondary | ICD-10-CM | POA: Diagnosis not present

## 2016-10-01 DIAGNOSIS — R2681 Unsteadiness on feet: Secondary | ICD-10-CM | POA: Diagnosis not present

## 2016-10-01 DIAGNOSIS — N189 Chronic kidney disease, unspecified: Secondary | ICD-10-CM | POA: Diagnosis not present

## 2016-10-01 DIAGNOSIS — Z86711 Personal history of pulmonary embolism: Secondary | ICD-10-CM | POA: Diagnosis not present

## 2016-10-01 DIAGNOSIS — M6281 Muscle weakness (generalized): Secondary | ICD-10-CM | POA: Diagnosis not present

## 2016-10-01 DIAGNOSIS — Z7901 Long term (current) use of anticoagulants: Secondary | ICD-10-CM | POA: Diagnosis not present

## 2016-10-01 DIAGNOSIS — I129 Hypertensive chronic kidney disease with stage 1 through stage 4 chronic kidney disease, or unspecified chronic kidney disease: Secondary | ICD-10-CM | POA: Diagnosis not present

## 2016-10-06 DIAGNOSIS — N189 Chronic kidney disease, unspecified: Secondary | ICD-10-CM | POA: Diagnosis not present

## 2016-10-06 DIAGNOSIS — Z7901 Long term (current) use of anticoagulants: Secondary | ICD-10-CM | POA: Diagnosis not present

## 2016-10-06 DIAGNOSIS — R2681 Unsteadiness on feet: Secondary | ICD-10-CM | POA: Diagnosis not present

## 2016-10-06 DIAGNOSIS — I129 Hypertensive chronic kidney disease with stage 1 through stage 4 chronic kidney disease, or unspecified chronic kidney disease: Secondary | ICD-10-CM | POA: Diagnosis not present

## 2016-10-06 DIAGNOSIS — M6281 Muscle weakness (generalized): Secondary | ICD-10-CM | POA: Diagnosis not present

## 2016-10-06 DIAGNOSIS — Z86711 Personal history of pulmonary embolism: Secondary | ICD-10-CM | POA: Diagnosis not present

## 2016-10-07 DIAGNOSIS — Z86711 Personal history of pulmonary embolism: Secondary | ICD-10-CM | POA: Diagnosis not present

## 2016-10-07 DIAGNOSIS — R2681 Unsteadiness on feet: Secondary | ICD-10-CM | POA: Diagnosis not present

## 2016-10-07 DIAGNOSIS — Z7901 Long term (current) use of anticoagulants: Secondary | ICD-10-CM | POA: Diagnosis not present

## 2016-10-07 DIAGNOSIS — N189 Chronic kidney disease, unspecified: Secondary | ICD-10-CM | POA: Diagnosis not present

## 2016-10-07 DIAGNOSIS — G309 Alzheimer's disease, unspecified: Secondary | ICD-10-CM | POA: Diagnosis not present

## 2016-10-07 DIAGNOSIS — I129 Hypertensive chronic kidney disease with stage 1 through stage 4 chronic kidney disease, or unspecified chronic kidney disease: Secondary | ICD-10-CM | POA: Diagnosis not present

## 2016-10-07 DIAGNOSIS — F329 Major depressive disorder, single episode, unspecified: Secondary | ICD-10-CM | POA: Diagnosis not present

## 2016-10-07 DIAGNOSIS — F39 Unspecified mood [affective] disorder: Secondary | ICD-10-CM | POA: Diagnosis not present

## 2016-10-07 DIAGNOSIS — M6281 Muscle weakness (generalized): Secondary | ICD-10-CM | POA: Diagnosis not present

## 2016-10-08 DIAGNOSIS — N189 Chronic kidney disease, unspecified: Secondary | ICD-10-CM | POA: Diagnosis not present

## 2016-10-08 DIAGNOSIS — R2681 Unsteadiness on feet: Secondary | ICD-10-CM | POA: Diagnosis not present

## 2016-10-08 DIAGNOSIS — I129 Hypertensive chronic kidney disease with stage 1 through stage 4 chronic kidney disease, or unspecified chronic kidney disease: Secondary | ICD-10-CM | POA: Diagnosis not present

## 2016-10-08 DIAGNOSIS — Z7901 Long term (current) use of anticoagulants: Secondary | ICD-10-CM | POA: Diagnosis not present

## 2016-10-08 DIAGNOSIS — M6281 Muscle weakness (generalized): Secondary | ICD-10-CM | POA: Diagnosis not present

## 2016-10-08 DIAGNOSIS — Z86711 Personal history of pulmonary embolism: Secondary | ICD-10-CM | POA: Diagnosis not present

## 2016-10-12 DIAGNOSIS — Z7901 Long term (current) use of anticoagulants: Secondary | ICD-10-CM | POA: Diagnosis not present

## 2016-10-12 DIAGNOSIS — R2681 Unsteadiness on feet: Secondary | ICD-10-CM | POA: Diagnosis not present

## 2016-10-12 DIAGNOSIS — L304 Erythema intertrigo: Secondary | ICD-10-CM | POA: Diagnosis not present

## 2016-10-12 DIAGNOSIS — Z86711 Personal history of pulmonary embolism: Secondary | ICD-10-CM | POA: Diagnosis not present

## 2016-10-12 DIAGNOSIS — R54 Age-related physical debility: Secondary | ICD-10-CM | POA: Diagnosis not present

## 2016-10-12 DIAGNOSIS — I129 Hypertensive chronic kidney disease with stage 1 through stage 4 chronic kidney disease, or unspecified chronic kidney disease: Secondary | ICD-10-CM | POA: Diagnosis not present

## 2016-10-12 DIAGNOSIS — M6281 Muscle weakness (generalized): Secondary | ICD-10-CM | POA: Diagnosis not present

## 2016-10-12 DIAGNOSIS — N189 Chronic kidney disease, unspecified: Secondary | ICD-10-CM | POA: Diagnosis not present

## 2016-10-14 DIAGNOSIS — I129 Hypertensive chronic kidney disease with stage 1 through stage 4 chronic kidney disease, or unspecified chronic kidney disease: Secondary | ICD-10-CM | POA: Diagnosis not present

## 2016-10-14 DIAGNOSIS — Z7901 Long term (current) use of anticoagulants: Secondary | ICD-10-CM | POA: Diagnosis not present

## 2016-10-14 DIAGNOSIS — N189 Chronic kidney disease, unspecified: Secondary | ICD-10-CM | POA: Diagnosis not present

## 2016-10-14 DIAGNOSIS — Z86711 Personal history of pulmonary embolism: Secondary | ICD-10-CM | POA: Diagnosis not present

## 2016-10-14 DIAGNOSIS — M6281 Muscle weakness (generalized): Secondary | ICD-10-CM | POA: Diagnosis not present

## 2016-10-14 DIAGNOSIS — R2681 Unsteadiness on feet: Secondary | ICD-10-CM | POA: Diagnosis not present

## 2016-11-02 DIAGNOSIS — Z79899 Other long term (current) drug therapy: Secondary | ICD-10-CM | POA: Diagnosis not present

## 2016-11-02 DIAGNOSIS — H2513 Age-related nuclear cataract, bilateral: Secondary | ICD-10-CM | POA: Diagnosis not present

## 2016-11-02 DIAGNOSIS — Z7901 Long term (current) use of anticoagulants: Secondary | ICD-10-CM | POA: Diagnosis not present

## 2016-11-04 DIAGNOSIS — R4189 Other symptoms and signs involving cognitive functions and awareness: Secondary | ICD-10-CM | POA: Diagnosis not present

## 2016-11-04 DIAGNOSIS — R451 Restlessness and agitation: Secondary | ICD-10-CM | POA: Diagnosis not present

## 2016-11-04 DIAGNOSIS — F329 Major depressive disorder, single episode, unspecified: Secondary | ICD-10-CM | POA: Diagnosis not present

## 2016-11-04 DIAGNOSIS — F419 Anxiety disorder, unspecified: Secondary | ICD-10-CM | POA: Diagnosis not present

## 2016-11-04 DIAGNOSIS — F4322 Adjustment disorder with anxiety: Secondary | ICD-10-CM | POA: Diagnosis not present

## 2016-11-04 DIAGNOSIS — R600 Localized edema: Secondary | ICD-10-CM | POA: Diagnosis not present

## 2016-11-16 DIAGNOSIS — G309 Alzheimer's disease, unspecified: Secondary | ICD-10-CM | POA: Diagnosis not present

## 2016-11-16 DIAGNOSIS — N189 Chronic kidney disease, unspecified: Secondary | ICD-10-CM | POA: Diagnosis not present

## 2016-11-16 DIAGNOSIS — I4891 Unspecified atrial fibrillation: Secondary | ICD-10-CM | POA: Diagnosis not present

## 2016-11-16 DIAGNOSIS — D649 Anemia, unspecified: Secondary | ICD-10-CM | POA: Diagnosis not present

## 2016-11-16 DIAGNOSIS — I129 Hypertensive chronic kidney disease with stage 1 through stage 4 chronic kidney disease, or unspecified chronic kidney disease: Secondary | ICD-10-CM | POA: Diagnosis not present

## 2016-11-30 DIAGNOSIS — M6281 Muscle weakness (generalized): Secondary | ICD-10-CM | POA: Diagnosis not present

## 2016-11-30 DIAGNOSIS — R451 Restlessness and agitation: Secondary | ICD-10-CM | POA: Diagnosis not present

## 2016-11-30 DIAGNOSIS — R269 Unspecified abnormalities of gait and mobility: Secondary | ICD-10-CM | POA: Diagnosis not present

## 2016-11-30 DIAGNOSIS — F329 Major depressive disorder, single episode, unspecified: Secondary | ICD-10-CM | POA: Diagnosis not present

## 2016-11-30 DIAGNOSIS — F4322 Adjustment disorder with anxiety: Secondary | ICD-10-CM | POA: Diagnosis not present

## 2016-11-30 DIAGNOSIS — W19XXXA Unspecified fall, initial encounter: Secondary | ICD-10-CM | POA: Diagnosis not present

## 2016-11-30 DIAGNOSIS — R4189 Other symptoms and signs involving cognitive functions and awareness: Secondary | ICD-10-CM | POA: Diagnosis not present

## 2016-11-30 DIAGNOSIS — F419 Anxiety disorder, unspecified: Secondary | ICD-10-CM | POA: Diagnosis not present

## 2016-11-30 DIAGNOSIS — R54 Age-related physical debility: Secondary | ICD-10-CM | POA: Diagnosis not present

## 2016-11-30 DIAGNOSIS — Z9181 History of falling: Secondary | ICD-10-CM | POA: Diagnosis not present

## 2016-11-30 DIAGNOSIS — G309 Alzheimer's disease, unspecified: Secondary | ICD-10-CM | POA: Diagnosis not present

## 2016-12-02 DIAGNOSIS — M25562 Pain in left knee: Secondary | ICD-10-CM | POA: Diagnosis not present

## 2016-12-02 DIAGNOSIS — R05 Cough: Secondary | ICD-10-CM | POA: Diagnosis not present

## 2016-12-08 DIAGNOSIS — Z86711 Personal history of pulmonary embolism: Secondary | ICD-10-CM | POA: Diagnosis not present

## 2016-12-08 DIAGNOSIS — E785 Hyperlipidemia, unspecified: Secondary | ICD-10-CM | POA: Diagnosis not present

## 2016-12-08 DIAGNOSIS — N189 Chronic kidney disease, unspecified: Secondary | ICD-10-CM | POA: Diagnosis not present

## 2016-12-08 DIAGNOSIS — M25562 Pain in left knee: Secondary | ICD-10-CM | POA: Diagnosis not present

## 2016-12-08 DIAGNOSIS — I509 Heart failure, unspecified: Secondary | ICD-10-CM | POA: Diagnosis not present

## 2016-12-08 DIAGNOSIS — F339 Major depressive disorder, recurrent, unspecified: Secondary | ICD-10-CM | POA: Diagnosis not present

## 2016-12-15 DIAGNOSIS — N390 Urinary tract infection, site not specified: Secondary | ICD-10-CM | POA: Diagnosis not present

## 2016-12-16 DIAGNOSIS — N390 Urinary tract infection, site not specified: Secondary | ICD-10-CM | POA: Diagnosis not present

## 2016-12-16 DIAGNOSIS — R54 Age-related physical debility: Secondary | ICD-10-CM | POA: Diagnosis not present

## 2016-12-16 DIAGNOSIS — F330 Major depressive disorder, recurrent, mild: Secondary | ICD-10-CM | POA: Diagnosis not present

## 2016-12-17 DIAGNOSIS — R350 Frequency of micturition: Secondary | ICD-10-CM | POA: Diagnosis not present

## 2016-12-17 DIAGNOSIS — R3981 Functional urinary incontinence: Secondary | ICD-10-CM | POA: Diagnosis not present

## 2016-12-17 DIAGNOSIS — N390 Urinary tract infection, site not specified: Secondary | ICD-10-CM | POA: Diagnosis not present

## 2016-12-17 DIAGNOSIS — R300 Dysuria: Secondary | ICD-10-CM | POA: Diagnosis not present

## 2016-12-29 DIAGNOSIS — D649 Anemia, unspecified: Secondary | ICD-10-CM | POA: Diagnosis not present

## 2016-12-29 DIAGNOSIS — L89322 Pressure ulcer of left buttock, stage 2: Secondary | ICD-10-CM | POA: Diagnosis not present

## 2016-12-29 DIAGNOSIS — N189 Chronic kidney disease, unspecified: Secondary | ICD-10-CM | POA: Diagnosis not present

## 2016-12-29 DIAGNOSIS — Z7901 Long term (current) use of anticoagulants: Secondary | ICD-10-CM | POA: Diagnosis not present

## 2016-12-29 DIAGNOSIS — R2681 Unsteadiness on feet: Secondary | ICD-10-CM | POA: Diagnosis not present

## 2016-12-29 DIAGNOSIS — Z86711 Personal history of pulmonary embolism: Secondary | ICD-10-CM | POA: Diagnosis not present

## 2016-12-29 DIAGNOSIS — E559 Vitamin D deficiency, unspecified: Secondary | ICD-10-CM | POA: Diagnosis not present

## 2016-12-29 DIAGNOSIS — E1122 Type 2 diabetes mellitus with diabetic chronic kidney disease: Secondary | ICD-10-CM | POA: Diagnosis not present

## 2016-12-29 DIAGNOSIS — I129 Hypertensive chronic kidney disease with stage 1 through stage 4 chronic kidney disease, or unspecified chronic kidney disease: Secondary | ICD-10-CM | POA: Diagnosis not present

## 2016-12-29 DIAGNOSIS — E785 Hyperlipidemia, unspecified: Secondary | ICD-10-CM | POA: Diagnosis not present

## 2016-12-29 DIAGNOSIS — M6281 Muscle weakness (generalized): Secondary | ICD-10-CM | POA: Diagnosis not present

## 2016-12-29 DIAGNOSIS — E039 Hypothyroidism, unspecified: Secondary | ICD-10-CM | POA: Diagnosis not present

## 2016-12-29 DIAGNOSIS — Z9181 History of falling: Secondary | ICD-10-CM | POA: Diagnosis not present

## 2017-01-02 DIAGNOSIS — N189 Chronic kidney disease, unspecified: Secondary | ICD-10-CM | POA: Diagnosis not present

## 2017-01-02 DIAGNOSIS — I129 Hypertensive chronic kidney disease with stage 1 through stage 4 chronic kidney disease, or unspecified chronic kidney disease: Secondary | ICD-10-CM | POA: Diagnosis not present

## 2017-01-02 DIAGNOSIS — L89322 Pressure ulcer of left buttock, stage 2: Secondary | ICD-10-CM | POA: Diagnosis not present

## 2017-01-02 DIAGNOSIS — D649 Anemia, unspecified: Secondary | ICD-10-CM | POA: Diagnosis not present

## 2017-01-02 DIAGNOSIS — E785 Hyperlipidemia, unspecified: Secondary | ICD-10-CM | POA: Diagnosis not present

## 2017-01-02 DIAGNOSIS — E1122 Type 2 diabetes mellitus with diabetic chronic kidney disease: Secondary | ICD-10-CM | POA: Diagnosis not present

## 2017-01-03 DIAGNOSIS — D649 Anemia, unspecified: Secondary | ICD-10-CM | POA: Diagnosis not present

## 2017-01-03 DIAGNOSIS — E1122 Type 2 diabetes mellitus with diabetic chronic kidney disease: Secondary | ICD-10-CM | POA: Diagnosis not present

## 2017-01-03 DIAGNOSIS — N189 Chronic kidney disease, unspecified: Secondary | ICD-10-CM | POA: Diagnosis not present

## 2017-01-03 DIAGNOSIS — E785 Hyperlipidemia, unspecified: Secondary | ICD-10-CM | POA: Diagnosis not present

## 2017-01-03 DIAGNOSIS — I129 Hypertensive chronic kidney disease with stage 1 through stage 4 chronic kidney disease, or unspecified chronic kidney disease: Secondary | ICD-10-CM | POA: Diagnosis not present

## 2017-01-03 DIAGNOSIS — L89322 Pressure ulcer of left buttock, stage 2: Secondary | ICD-10-CM | POA: Diagnosis not present

## 2017-01-05 DIAGNOSIS — I129 Hypertensive chronic kidney disease with stage 1 through stage 4 chronic kidney disease, or unspecified chronic kidney disease: Secondary | ICD-10-CM | POA: Diagnosis not present

## 2017-01-05 DIAGNOSIS — L89322 Pressure ulcer of left buttock, stage 2: Secondary | ICD-10-CM | POA: Diagnosis not present

## 2017-01-05 DIAGNOSIS — E1122 Type 2 diabetes mellitus with diabetic chronic kidney disease: Secondary | ICD-10-CM | POA: Diagnosis not present

## 2017-01-05 DIAGNOSIS — D649 Anemia, unspecified: Secondary | ICD-10-CM | POA: Diagnosis not present

## 2017-01-05 DIAGNOSIS — E785 Hyperlipidemia, unspecified: Secondary | ICD-10-CM | POA: Diagnosis not present

## 2017-01-05 DIAGNOSIS — N189 Chronic kidney disease, unspecified: Secondary | ICD-10-CM | POA: Diagnosis not present

## 2017-01-06 DIAGNOSIS — D649 Anemia, unspecified: Secondary | ICD-10-CM | POA: Diagnosis not present

## 2017-01-06 DIAGNOSIS — L89322 Pressure ulcer of left buttock, stage 2: Secondary | ICD-10-CM | POA: Diagnosis not present

## 2017-01-06 DIAGNOSIS — N189 Chronic kidney disease, unspecified: Secondary | ICD-10-CM | POA: Diagnosis not present

## 2017-01-06 DIAGNOSIS — E785 Hyperlipidemia, unspecified: Secondary | ICD-10-CM | POA: Diagnosis not present

## 2017-01-06 DIAGNOSIS — I129 Hypertensive chronic kidney disease with stage 1 through stage 4 chronic kidney disease, or unspecified chronic kidney disease: Secondary | ICD-10-CM | POA: Diagnosis not present

## 2017-01-06 DIAGNOSIS — E1122 Type 2 diabetes mellitus with diabetic chronic kidney disease: Secondary | ICD-10-CM | POA: Diagnosis not present

## 2017-01-10 DIAGNOSIS — I129 Hypertensive chronic kidney disease with stage 1 through stage 4 chronic kidney disease, or unspecified chronic kidney disease: Secondary | ICD-10-CM | POA: Diagnosis not present

## 2017-01-10 DIAGNOSIS — E1122 Type 2 diabetes mellitus with diabetic chronic kidney disease: Secondary | ICD-10-CM | POA: Diagnosis not present

## 2017-01-10 DIAGNOSIS — N189 Chronic kidney disease, unspecified: Secondary | ICD-10-CM | POA: Diagnosis not present

## 2017-01-10 DIAGNOSIS — L89322 Pressure ulcer of left buttock, stage 2: Secondary | ICD-10-CM | POA: Diagnosis not present

## 2017-01-10 DIAGNOSIS — E785 Hyperlipidemia, unspecified: Secondary | ICD-10-CM | POA: Diagnosis not present

## 2017-01-10 DIAGNOSIS — D649 Anemia, unspecified: Secondary | ICD-10-CM | POA: Diagnosis not present

## 2017-01-11 DIAGNOSIS — D649 Anemia, unspecified: Secondary | ICD-10-CM | POA: Diagnosis not present

## 2017-01-11 DIAGNOSIS — L89322 Pressure ulcer of left buttock, stage 2: Secondary | ICD-10-CM | POA: Diagnosis not present

## 2017-01-11 DIAGNOSIS — E1122 Type 2 diabetes mellitus with diabetic chronic kidney disease: Secondary | ICD-10-CM | POA: Diagnosis not present

## 2017-01-11 DIAGNOSIS — E785 Hyperlipidemia, unspecified: Secondary | ICD-10-CM | POA: Diagnosis not present

## 2017-01-11 DIAGNOSIS — N189 Chronic kidney disease, unspecified: Secondary | ICD-10-CM | POA: Diagnosis not present

## 2017-01-11 DIAGNOSIS — I129 Hypertensive chronic kidney disease with stage 1 through stage 4 chronic kidney disease, or unspecified chronic kidney disease: Secondary | ICD-10-CM | POA: Diagnosis not present

## 2017-01-13 DIAGNOSIS — N390 Urinary tract infection, site not specified: Secondary | ICD-10-CM | POA: Diagnosis not present

## 2017-01-13 DIAGNOSIS — I129 Hypertensive chronic kidney disease with stage 1 through stage 4 chronic kidney disease, or unspecified chronic kidney disease: Secondary | ICD-10-CM | POA: Diagnosis not present

## 2017-01-13 DIAGNOSIS — L89322 Pressure ulcer of left buttock, stage 2: Secondary | ICD-10-CM | POA: Diagnosis not present

## 2017-01-13 DIAGNOSIS — N189 Chronic kidney disease, unspecified: Secondary | ICD-10-CM | POA: Diagnosis not present

## 2017-01-13 DIAGNOSIS — D649 Anemia, unspecified: Secondary | ICD-10-CM | POA: Diagnosis not present

## 2017-01-13 DIAGNOSIS — E1122 Type 2 diabetes mellitus with diabetic chronic kidney disease: Secondary | ICD-10-CM | POA: Diagnosis not present

## 2017-01-13 DIAGNOSIS — E785 Hyperlipidemia, unspecified: Secondary | ICD-10-CM | POA: Diagnosis not present

## 2017-01-19 DIAGNOSIS — N189 Chronic kidney disease, unspecified: Secondary | ICD-10-CM | POA: Diagnosis not present

## 2017-01-19 DIAGNOSIS — E785 Hyperlipidemia, unspecified: Secondary | ICD-10-CM | POA: Diagnosis not present

## 2017-01-19 DIAGNOSIS — E1122 Type 2 diabetes mellitus with diabetic chronic kidney disease: Secondary | ICD-10-CM | POA: Diagnosis not present

## 2017-01-19 DIAGNOSIS — L89322 Pressure ulcer of left buttock, stage 2: Secondary | ICD-10-CM | POA: Diagnosis not present

## 2017-01-19 DIAGNOSIS — I129 Hypertensive chronic kidney disease with stage 1 through stage 4 chronic kidney disease, or unspecified chronic kidney disease: Secondary | ICD-10-CM | POA: Diagnosis not present

## 2017-01-19 DIAGNOSIS — D649 Anemia, unspecified: Secondary | ICD-10-CM | POA: Diagnosis not present

## 2017-01-20 DIAGNOSIS — F329 Major depressive disorder, single episode, unspecified: Secondary | ICD-10-CM | POA: Diagnosis not present

## 2017-01-20 DIAGNOSIS — F4322 Adjustment disorder with anxiety: Secondary | ICD-10-CM | POA: Diagnosis not present

## 2017-01-20 DIAGNOSIS — F419 Anxiety disorder, unspecified: Secondary | ICD-10-CM | POA: Diagnosis not present

## 2017-01-20 DIAGNOSIS — R451 Restlessness and agitation: Secondary | ICD-10-CM | POA: Diagnosis not present

## 2017-01-20 DIAGNOSIS — R4189 Other symptoms and signs involving cognitive functions and awareness: Secondary | ICD-10-CM | POA: Diagnosis not present

## 2017-01-21 DIAGNOSIS — E1122 Type 2 diabetes mellitus with diabetic chronic kidney disease: Secondary | ICD-10-CM | POA: Diagnosis not present

## 2017-01-21 DIAGNOSIS — E785 Hyperlipidemia, unspecified: Secondary | ICD-10-CM | POA: Diagnosis not present

## 2017-01-21 DIAGNOSIS — N189 Chronic kidney disease, unspecified: Secondary | ICD-10-CM | POA: Diagnosis not present

## 2017-01-21 DIAGNOSIS — L89322 Pressure ulcer of left buttock, stage 2: Secondary | ICD-10-CM | POA: Diagnosis not present

## 2017-01-21 DIAGNOSIS — I129 Hypertensive chronic kidney disease with stage 1 through stage 4 chronic kidney disease, or unspecified chronic kidney disease: Secondary | ICD-10-CM | POA: Diagnosis not present

## 2017-01-21 DIAGNOSIS — D649 Anemia, unspecified: Secondary | ICD-10-CM | POA: Diagnosis not present

## 2017-01-24 DIAGNOSIS — R262 Difficulty in walking, not elsewhere classified: Secondary | ICD-10-CM | POA: Diagnosis not present

## 2017-01-24 DIAGNOSIS — M79675 Pain in left toe(s): Secondary | ICD-10-CM | POA: Diagnosis not present

## 2017-01-24 DIAGNOSIS — B351 Tinea unguium: Secondary | ICD-10-CM | POA: Diagnosis not present

## 2017-01-24 DIAGNOSIS — M79674 Pain in right toe(s): Secondary | ICD-10-CM | POA: Diagnosis not present

## 2017-01-25 DIAGNOSIS — I129 Hypertensive chronic kidney disease with stage 1 through stage 4 chronic kidney disease, or unspecified chronic kidney disease: Secondary | ICD-10-CM | POA: Diagnosis not present

## 2017-01-25 DIAGNOSIS — E1122 Type 2 diabetes mellitus with diabetic chronic kidney disease: Secondary | ICD-10-CM | POA: Diagnosis not present

## 2017-01-25 DIAGNOSIS — D649 Anemia, unspecified: Secondary | ICD-10-CM | POA: Diagnosis not present

## 2017-01-25 DIAGNOSIS — N189 Chronic kidney disease, unspecified: Secondary | ICD-10-CM | POA: Diagnosis not present

## 2017-01-25 DIAGNOSIS — L89322 Pressure ulcer of left buttock, stage 2: Secondary | ICD-10-CM | POA: Diagnosis not present

## 2017-01-25 DIAGNOSIS — E785 Hyperlipidemia, unspecified: Secondary | ICD-10-CM | POA: Diagnosis not present

## 2017-01-26 DIAGNOSIS — E1122 Type 2 diabetes mellitus with diabetic chronic kidney disease: Secondary | ICD-10-CM | POA: Diagnosis not present

## 2017-01-26 DIAGNOSIS — I129 Hypertensive chronic kidney disease with stage 1 through stage 4 chronic kidney disease, or unspecified chronic kidney disease: Secondary | ICD-10-CM | POA: Diagnosis not present

## 2017-01-26 DIAGNOSIS — L89322 Pressure ulcer of left buttock, stage 2: Secondary | ICD-10-CM | POA: Diagnosis not present

## 2017-01-26 DIAGNOSIS — N189 Chronic kidney disease, unspecified: Secondary | ICD-10-CM | POA: Diagnosis not present

## 2017-01-26 DIAGNOSIS — E785 Hyperlipidemia, unspecified: Secondary | ICD-10-CM | POA: Diagnosis not present

## 2017-01-26 DIAGNOSIS — D649 Anemia, unspecified: Secondary | ICD-10-CM | POA: Diagnosis not present

## 2017-01-27 DIAGNOSIS — L304 Erythema intertrigo: Secondary | ICD-10-CM | POA: Diagnosis not present

## 2017-01-27 DIAGNOSIS — R54 Age-related physical debility: Secondary | ICD-10-CM | POA: Diagnosis not present

## 2017-02-02 DIAGNOSIS — R197 Diarrhea, unspecified: Secondary | ICD-10-CM | POA: Diagnosis not present

## 2017-02-02 DIAGNOSIS — K921 Melena: Secondary | ICD-10-CM | POA: Diagnosis not present

## 2017-02-09 DIAGNOSIS — R600 Localized edema: Secondary | ICD-10-CM | POA: Diagnosis not present

## 2017-02-10 DIAGNOSIS — M6281 Muscle weakness (generalized): Secondary | ICD-10-CM | POA: Diagnosis not present

## 2017-02-10 DIAGNOSIS — W19XXXA Unspecified fall, initial encounter: Secondary | ICD-10-CM | POA: Diagnosis not present

## 2017-02-10 DIAGNOSIS — G309 Alzheimer's disease, unspecified: Secondary | ICD-10-CM | POA: Diagnosis not present

## 2017-02-10 DIAGNOSIS — R54 Age-related physical debility: Secondary | ICD-10-CM | POA: Diagnosis not present

## 2017-02-10 DIAGNOSIS — R269 Unspecified abnormalities of gait and mobility: Secondary | ICD-10-CM | POA: Diagnosis not present

## 2017-02-24 DIAGNOSIS — F419 Anxiety disorder, unspecified: Secondary | ICD-10-CM | POA: Diagnosis not present

## 2017-02-24 DIAGNOSIS — F329 Major depressive disorder, single episode, unspecified: Secondary | ICD-10-CM | POA: Diagnosis not present

## 2017-02-24 DIAGNOSIS — R451 Restlessness and agitation: Secondary | ICD-10-CM | POA: Diagnosis not present

## 2017-02-24 DIAGNOSIS — R4189 Other symptoms and signs involving cognitive functions and awareness: Secondary | ICD-10-CM | POA: Diagnosis not present

## 2017-02-24 DIAGNOSIS — Z23 Encounter for immunization: Secondary | ICD-10-CM | POA: Diagnosis not present

## 2017-02-24 DIAGNOSIS — F4322 Adjustment disorder with anxiety: Secondary | ICD-10-CM | POA: Diagnosis not present

## 2017-02-27 IMAGING — CR DG CHEST 1V
1 series · 1 of 1 positions shown · non-contrast
Comparison: None.

CLINICAL DATA: Found on floor. Concern for chest injury. Initial
encounter.

EXAM:
CHEST 1 VIEW

[t chest supine]
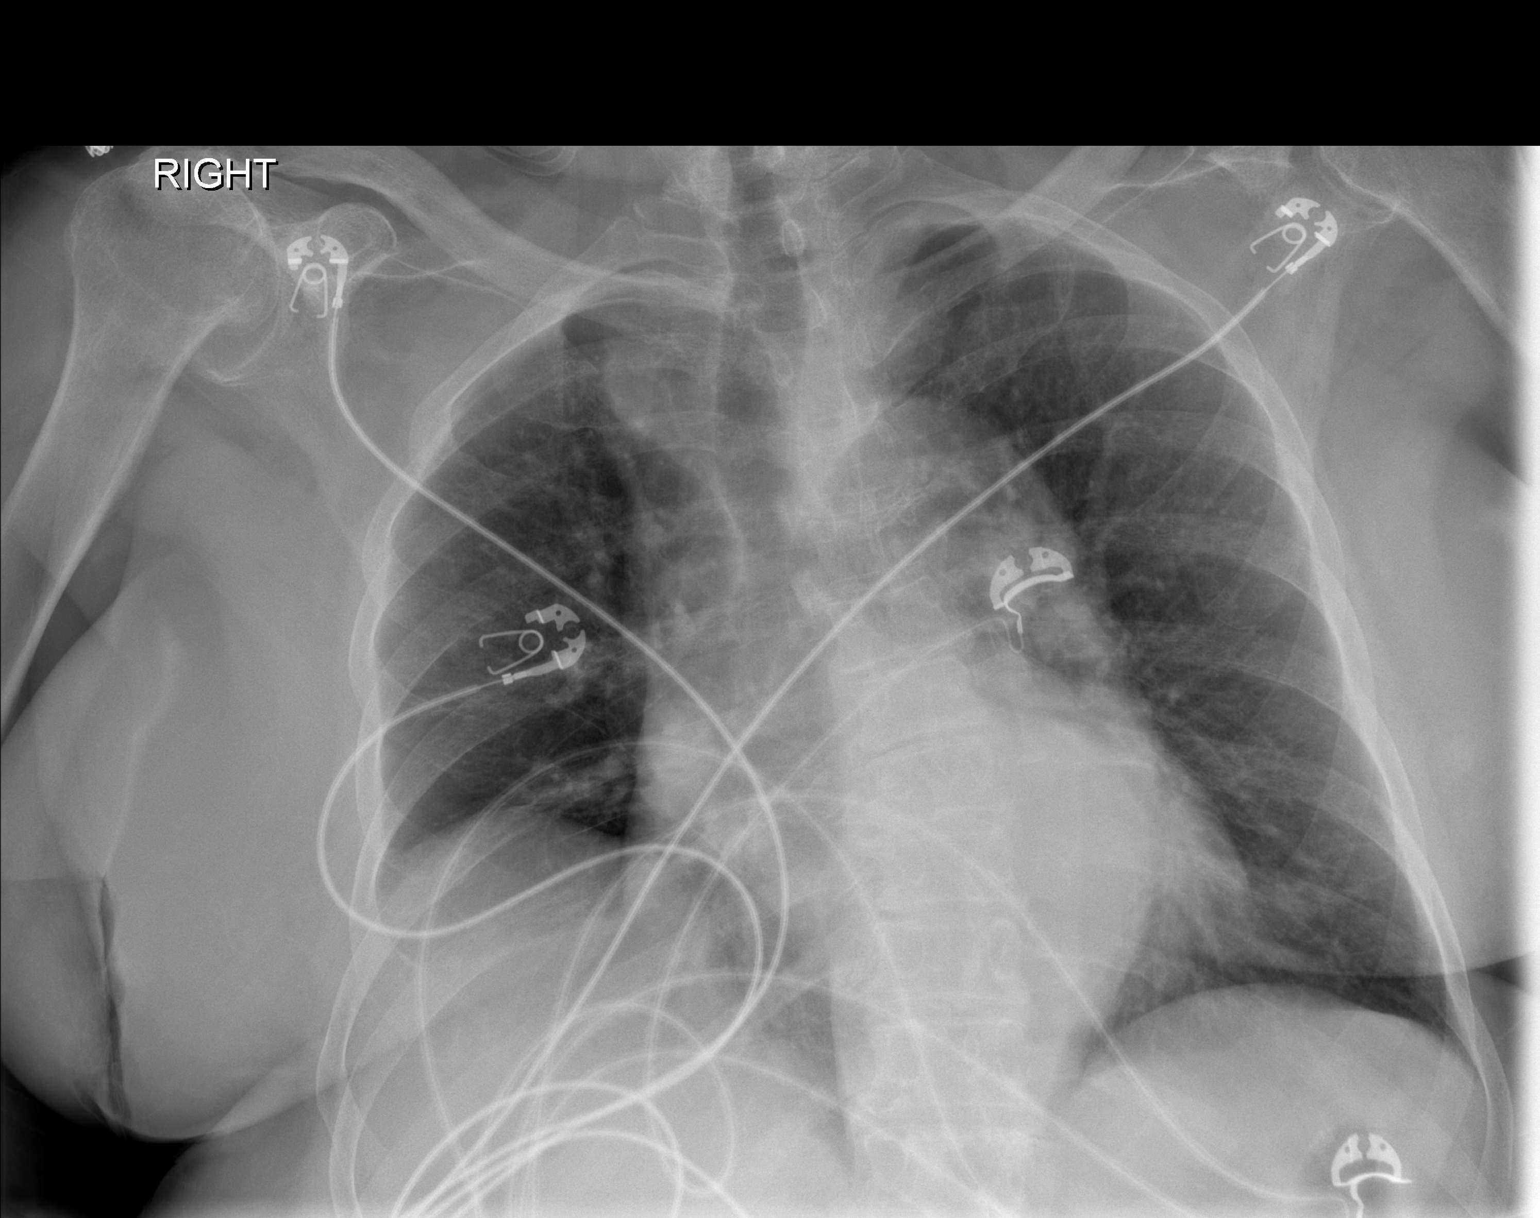

[1 of 1 positions shown; findings below may reference images not displayed]

FINDINGS: There is elevation of the right hemidiaphragm. There is no evidence
of focal opacification, pleural effusion or pneumothorax.

The cardiomediastinal silhouette is borderline normal in size. No
acute osseous abnormalities are seen. There is chronic elevation of
the right humeral head, reflecting chronic rotator cuff tear.
IMPRESSION: Elevation of the right hemidiaphragm. Lungs remain grossly clear. No
displaced rib fracture seen.

## 2017-02-27 IMAGING — CT CT ANGIO CHEST
2 of 6 series · 18 of 36 positions shown · IV contrast (omnipaque)
Comparison: Chest radiograph performed earlier today at [DATE] p.m.

CLINICAL DATA: Patient found on floor unresponsive. Desaturation.
Initial encounter.

EXAM:
CT ANGIOGRAPHY CHEST WITH CONTRAST
TECHNIQUE: Multidetector CT imaging of the chest was performed using the
standard protocol during bolus administration of intravenous
contrast. Multiplanar CT image reconstructions and MIPs were
obtained to evaluate the vascular anatomy.
CONTRAST:  80mL OMNIPAQUE IOHEXOL 350 MG/ML SOLN

[Series 5: coronal mpr · coronal · 0.49mm/px · 1 of 151 slices shown]
[im 76/151  mediastinal]
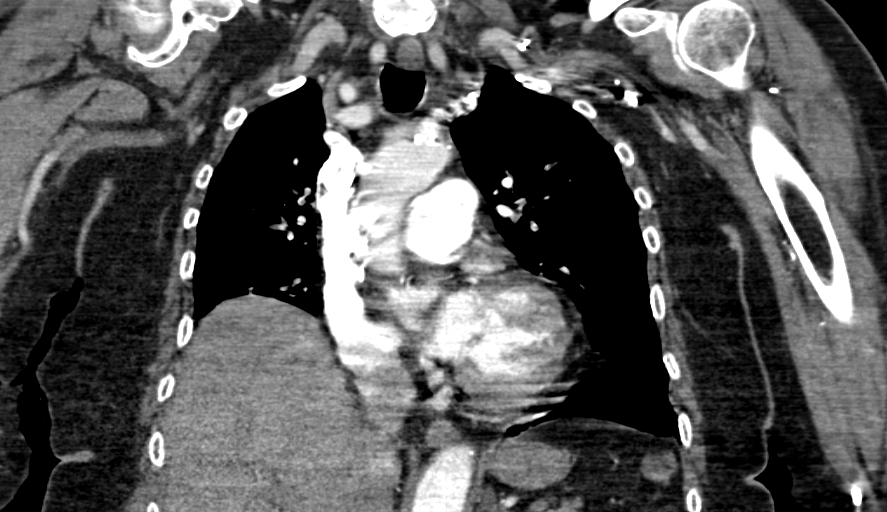

[Series 10: thins for pacs · axial · 0.74mm/px · z∈[+1356,+1581]mm · 17 of 251 slices shown]
[im 13/251  lung]
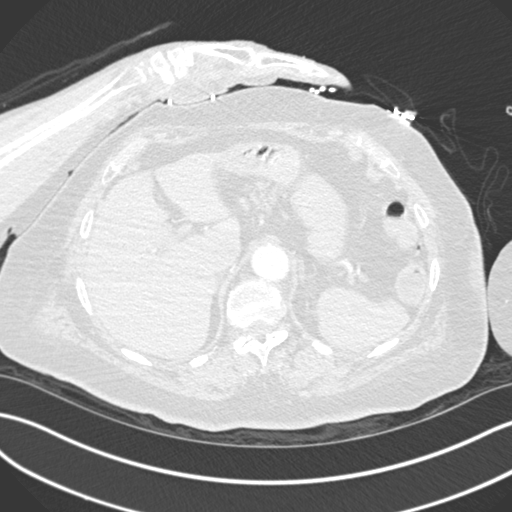
[im 26/251  mediastinal]
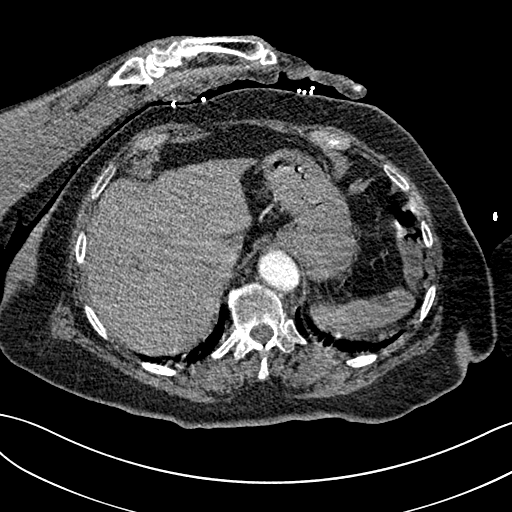
[im 38/251  lung]
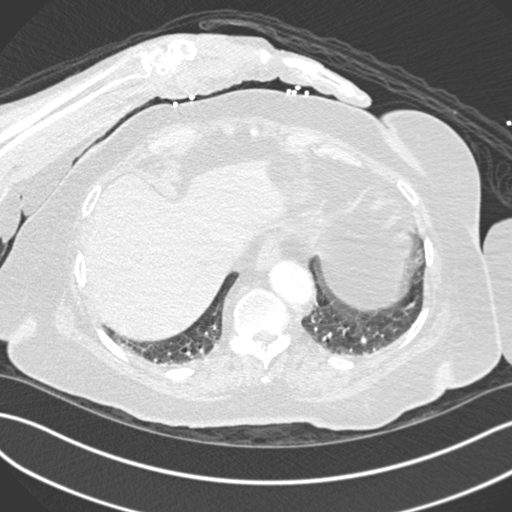
[im 51/251  mediastinal]
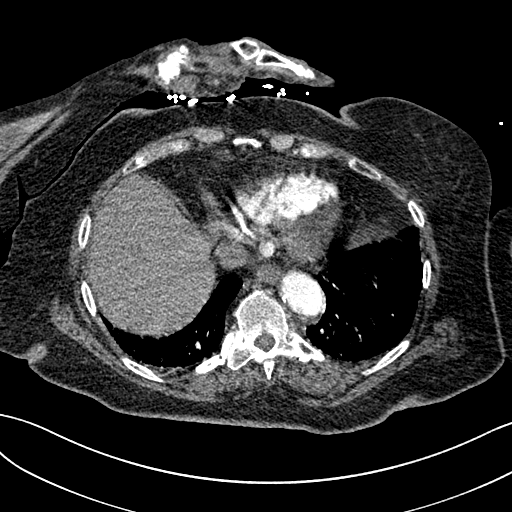
[im 76/251  lung]
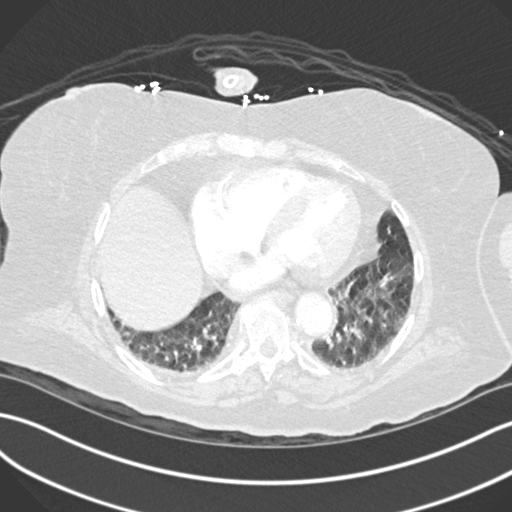
[im 88/251  mediastinal]
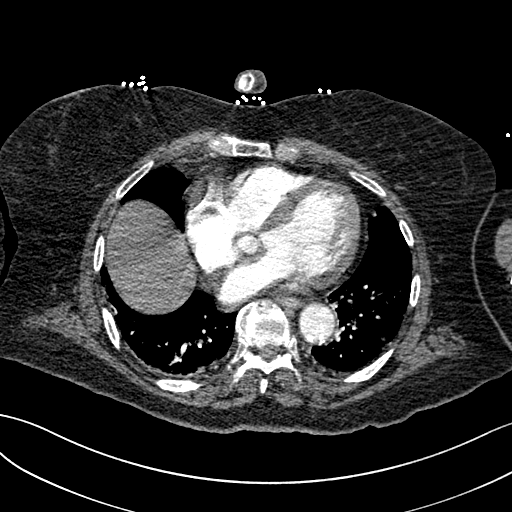
[im 101/251  lung]
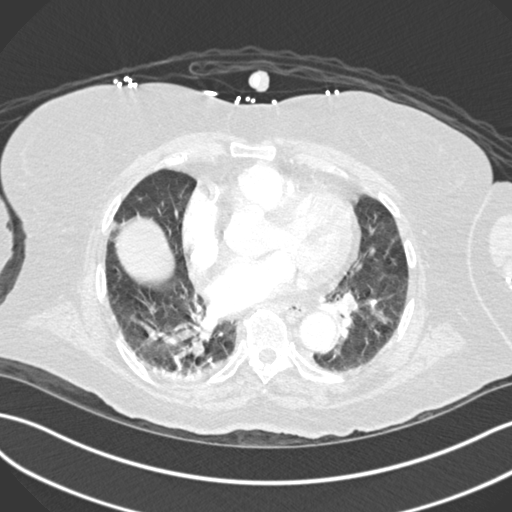
[im 113/251  mediastinal]
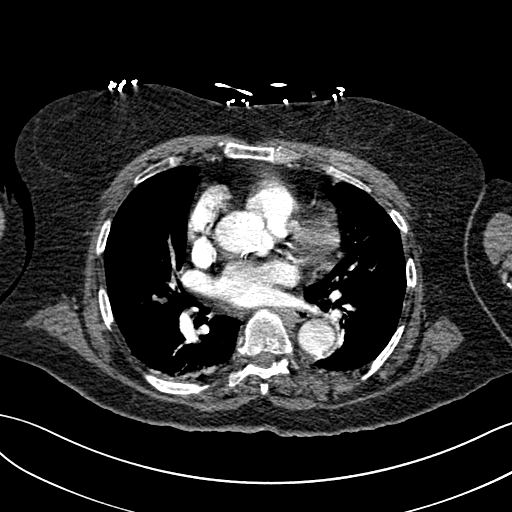
[im 126/251  lung]
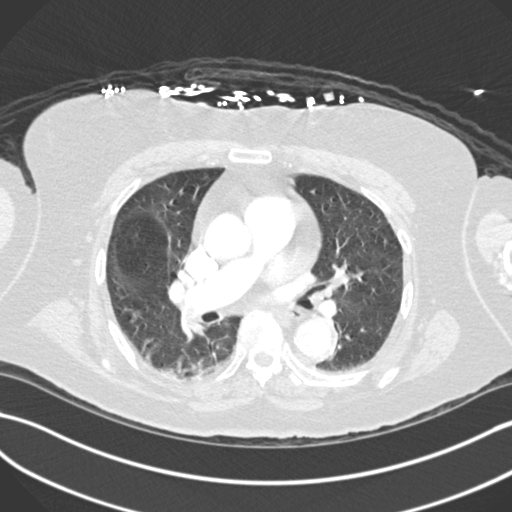
[im 138/251  mediastinal]
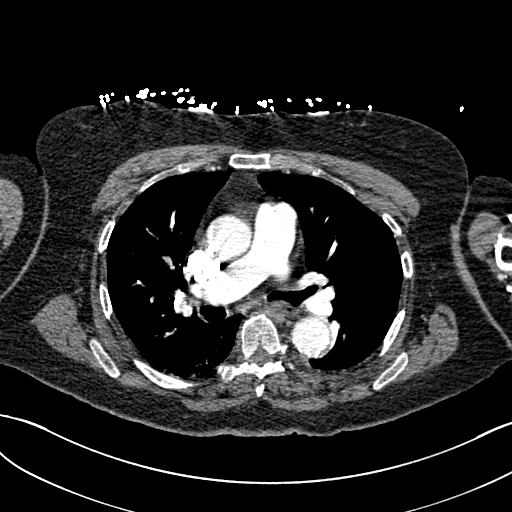
[im 151/251  lung]
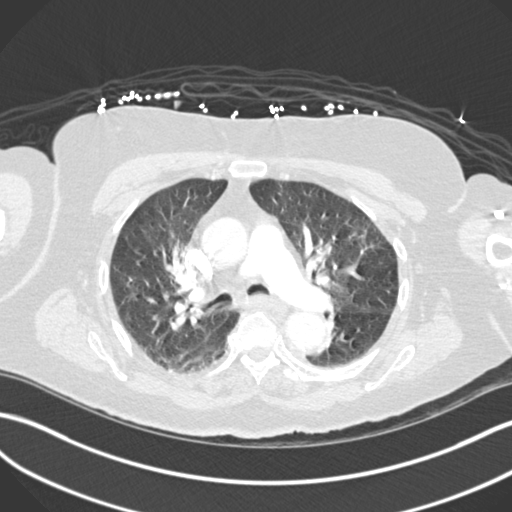
[im 163/251  mediastinal]
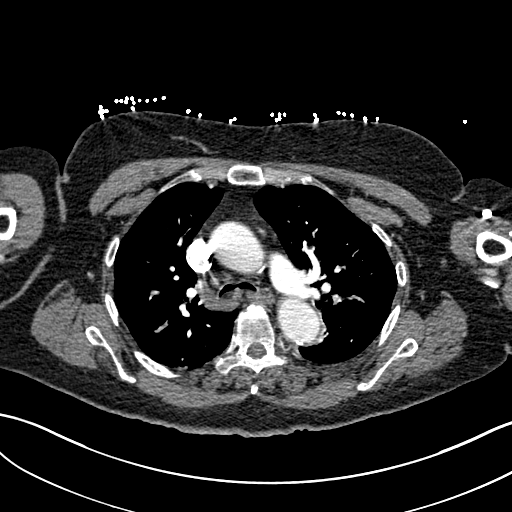
[im 176/251  lung]
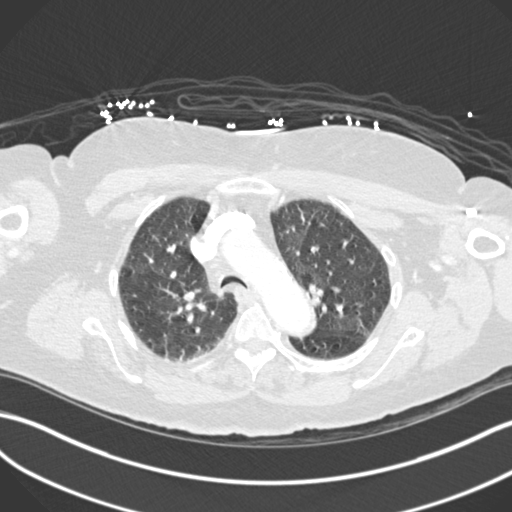
[im 201/251  mediastinal]
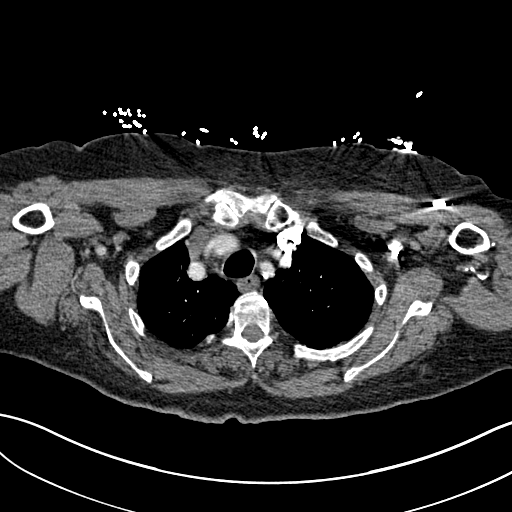
[im 213/251  lung]
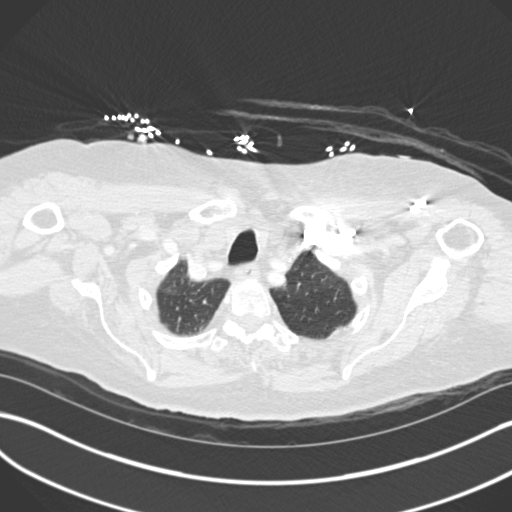
[im 226/251  mediastinal]
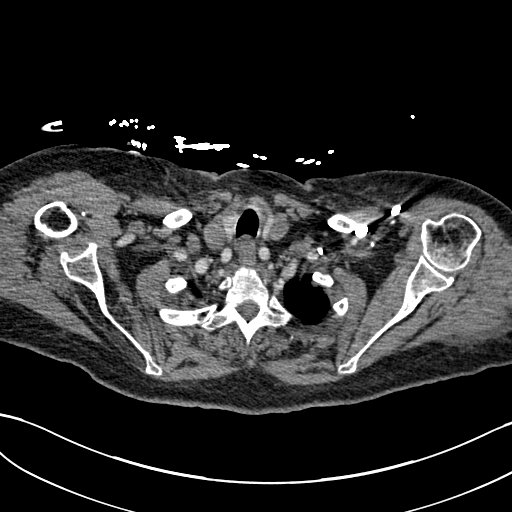
[im 238/251  lung]
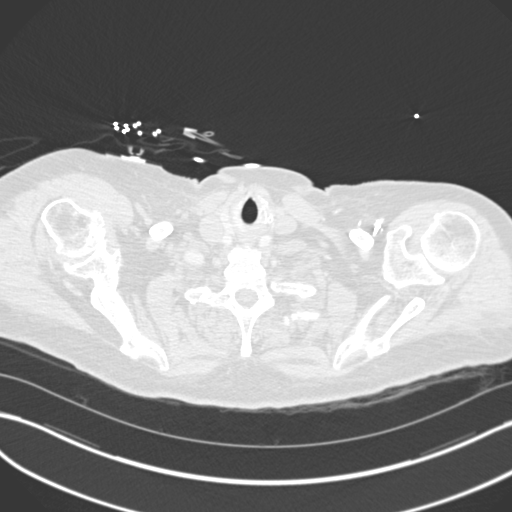

[18 of 36 positions shown; findings below may reference images not displayed]

FINDINGS: Segmental pulmonary emboli are noted within pulmonary arteries to
the right lower lobe and left upper lobe, and smaller emboli within
the pulmonary arteries to the right upper lobe and left lower lobe.
The RV/LV ratio of 0.96 corresponds to concern for submassive
pulmonary embolus.

Mild patchy right-sided atelectasis is noted. The lungs are
otherwise grossly clear. There is no evidence of pleural effusion or
pneumothorax. No masses are identified; no abnormal focal contrast
enhancement is seen.

Scattered mediastinal nodes remain normal in size. No pericardial
effusion is identified. Scattered calcification is noted along the
thoracic aorta. The great vessels are grossly unremarkable. No
axillary lymphadenopathy is seen. The thyroid gland is unremarkable
in appearance.

The visualized portions of the liver and spleen are unremarkable.

No acute osseous abnormalities are seen.

Review of the MIP images confirms the above findings.
IMPRESSION: 1. Segmental pulmonary emboli within pulmonary arteries to the right
lower lobe and left upper lobe, and smaller emboli within the
pulmonary arteries to the right upper lobe and left lower lobe. CT
evidence of right heart strain (RV/LV Ratio = 0.96) consistent with
at least submassive (intermediate risk) PE. The presence of right
heart strain has been associated with an increased risk of morbidity
and mortality. Please activate Code PE by paging [phone].
2. Mild patchy right-sided atelectasis noted. Lungs otherwise clear.
Critical Value/emergent results were called by telephone at the time
of interpretation on 04/13/2015 at [DATE] to Dr. DARYL HODO,
who verbally acknowledged these results.

## 2017-03-02 DIAGNOSIS — M79674 Pain in right toe(s): Secondary | ICD-10-CM | POA: Diagnosis not present

## 2017-03-02 DIAGNOSIS — M2011 Hallux valgus (acquired), right foot: Secondary | ICD-10-CM | POA: Diagnosis not present

## 2017-03-03 DIAGNOSIS — R4189 Other symptoms and signs involving cognitive functions and awareness: Secondary | ICD-10-CM | POA: Diagnosis not present

## 2017-03-03 DIAGNOSIS — F4322 Adjustment disorder with anxiety: Secondary | ICD-10-CM | POA: Diagnosis not present

## 2017-03-03 DIAGNOSIS — F419 Anxiety disorder, unspecified: Secondary | ICD-10-CM | POA: Diagnosis not present

## 2017-03-03 DIAGNOSIS — F329 Major depressive disorder, single episode, unspecified: Secondary | ICD-10-CM | POA: Diagnosis not present

## 2017-03-03 DIAGNOSIS — R451 Restlessness and agitation: Secondary | ICD-10-CM | POA: Diagnosis not present

## 2017-03-21 ENCOUNTER — Ambulatory Visit: Payer: Medicare Other | Admitting: Podiatry

## 2017-03-24 DIAGNOSIS — R54 Age-related physical debility: Secondary | ICD-10-CM | POA: Diagnosis not present

## 2017-03-24 DIAGNOSIS — L89320 Pressure ulcer of left buttock, unstageable: Secondary | ICD-10-CM | POA: Diagnosis not present

## 2017-03-24 DIAGNOSIS — R451 Restlessness and agitation: Secondary | ICD-10-CM | POA: Diagnosis not present

## 2017-03-24 DIAGNOSIS — F329 Major depressive disorder, single episode, unspecified: Secondary | ICD-10-CM | POA: Diagnosis not present

## 2017-03-24 DIAGNOSIS — F4322 Adjustment disorder with anxiety: Secondary | ICD-10-CM | POA: Diagnosis not present

## 2017-03-24 DIAGNOSIS — R4189 Other symptoms and signs involving cognitive functions and awareness: Secondary | ICD-10-CM | POA: Diagnosis not present

## 2017-03-24 DIAGNOSIS — R269 Unspecified abnormalities of gait and mobility: Secondary | ICD-10-CM | POA: Diagnosis not present

## 2017-03-24 DIAGNOSIS — R3981 Functional urinary incontinence: Secondary | ICD-10-CM | POA: Diagnosis not present

## 2017-03-24 DIAGNOSIS — F419 Anxiety disorder, unspecified: Secondary | ICD-10-CM | POA: Diagnosis not present

## 2017-03-28 ENCOUNTER — Telehealth: Payer: Self-pay | Admitting: Podiatry

## 2017-03-28 ENCOUNTER — Ambulatory Visit (INDEPENDENT_AMBULATORY_CARE_PROVIDER_SITE_OTHER): Payer: Medicare Other | Admitting: Podiatry

## 2017-03-28 ENCOUNTER — Encounter: Payer: Self-pay | Admitting: Podiatry

## 2017-03-28 ENCOUNTER — Ambulatory Visit (INDEPENDENT_AMBULATORY_CARE_PROVIDER_SITE_OTHER): Payer: Medicare Other

## 2017-03-28 VITALS — BP 120/57 | HR 65

## 2017-03-28 DIAGNOSIS — M21611 Bunion of right foot: Secondary | ICD-10-CM

## 2017-03-28 DIAGNOSIS — M7751 Other enthesopathy of right foot: Secondary | ICD-10-CM

## 2017-03-28 DIAGNOSIS — M19079 Primary osteoarthritis, unspecified ankle and foot: Secondary | ICD-10-CM | POA: Diagnosis not present

## 2017-03-28 DIAGNOSIS — M21619 Bunion of unspecified foot: Secondary | ICD-10-CM

## 2017-03-28 DIAGNOSIS — M2011 Hallux valgus (acquired), right foot: Secondary | ICD-10-CM | POA: Diagnosis not present

## 2017-03-28 NOTE — Telephone Encounter (Signed)
My mother has an appointment this afternoon at 2:30 pm. I am going to be coming with her and I would like a call back this morning before we come over there. Please call me back at [phone]. Thank you.

## 2017-03-29 NOTE — Progress Notes (Signed)
Subjective:  Patient ID: Carrie Smith, female    DOB: [birth year],  MRN: [id number]  Chief Complaint  Patient presents with  . Bunions    Painful bunion for years    81 y.o. female presents with the above complaint. Reports painful bunion to the right foot times many yeas. Denies prior treatment. Patient is wheelchair-bound and uses her right foot only for transfers. Patient has history of dementia.   Past Medical History:  Diagnosis Date  . Arthritis   . Back pain   . Celiac disease    a. versus ischemic colitis versus microscopic colitis per chart.  . Dementia   . GI bleed    a. remote GIB in setting of NSAIDs for arthritis.  . Hypercholesterolemia   . Hyperlipidemia   . Obesity   . Osteoporosis   . Stroke [facility])    Past Surgical History:  Procedure Laterality Date  . BACK SURGERY    . CHOLECYSTECTOMY      Current Outpatient Prescriptions:  .  atorvastatin (LIPITOR) 80 MG tablet, Take 80 mg by mouth daily., Disp: , Rfl: 0 .  Cholecalciferol (VITAMIN D3) 1000 units CAPS, Take by mouth daily., Disp: , Rfl:  .  ELIQUIS 5 MG TABS tablet, Take 5 mg by mouth 2 (two) times daily., Disp: , Rfl: 2 .  famotidine (PEPCID) 20 MG tablet, , Disp: , Rfl: 10 .  ferrous sulfate 325 (65 FE) MG tablet, Take 325 mg by mouth daily with breakfast., Disp: , Rfl:  .  FLUoxetine (PROZAC) 20 MG capsule, , Disp: , Rfl: 4 .  furosemide (LASIX) 40 MG tablet, Take 1 tablet (40 mg total) by mouth daily., Disp: , Rfl:  .  memantine (NAMENDA) 10 MG tablet, Take 1 tablet (10 mg total) by mouth 2 (two) times daily., Disp: 180 tablet, Rfl: 3 .  metoprolol tartrate (LOPRESSOR) 25 MG tablet, Take 0.5 tablets (12.5 mg total) by mouth 2 (two) times daily., Disp: , Rfl:  .  NYAMYC powder, , Disp: , Rfl: 4 .  potassium chloride SA (K-DUR,KLOR-CON) 20 MEQ tablet, Take 2 tablets (40 mEq total) by mouth daily., Disp: , Rfl:  .  QUEtiapine (SEROQUEL) 25 MG tablet, Take 1 tablet (25 mg total) by mouth at bedtime.,  Disp: 30 tablet, Rfl: 11 .  allopurinol (ZYLOPRIM) 300 MG tablet, TAKE 1 TABLET EVERY DAY TO PREVENT GOUT, Disp: , Rfl: 3 .  folic acid (FOLVITE) 1 MG tablet, Take 1 mg by mouth daily., Disp: , Rfl: 3 .  Rivaroxaban (XARELTO) 15 MG TABS tablet, Take 1 tablet (15 mg total) by mouth 2 (two) times daily with a meal., Disp: 30 tablet, Rfl: 0  Allergies  Allergen Reactions  . Codeine    Review of Systems Objective:   Vitals:   03/28/17 1454  BP: (!) 120/57  Pulse: 65   General AA&O x3. Normal mood and affect.  Vascular Dorsalis pedis and posterior tibial pulses  present 1+ and absent bilaterally. Capillary refill normal to all digits. Pedal hair growth diminished.  Neurologic Epicritic sensation grossly intact.  Dermatologic No open lesions. Interspaces clear of maceration.  Normal skin temperature and turgor. Hyperkeratotic lesions: none bilaterally  Orthopedic: MMT 5/5 in dorsiflexion, plantarflexion, inversion, and eversion. Severe HAV deformity R foot with prominent erythematous bursa overlying the 1st MPJ. Pain and crepitus on ROM. Pain worst with palpation of the bursa.   Radiographs: Taken and reviewed. Severe HAV deformity with joint subluxation and degenerative changes to the 1st MPJ  Assessment & Plan:  Patient was evaluated and treated and all questions answered.  Severe HAV defomity R -Injection deivered to the bunion bursa as below. -Padding dispensed. -Discussed conservative vs surgical intervention for this issue. Patient wishes to consider conservative therapy only. Patient has a higher risk with surgery given her age and medical issues. Patient's functional status not likely to improve after surgical intervention.  Procedure: Joint Injection Location: Right 1st MPJ bursa Skin Prep: Alcohol. Injectate: 0.5 cc 1% lidocaine plain, 0.5 cc dexamethasone phosphate. Disposition: Patient tolerated procedure well. Injection site dressed with a band-aid.   Return in  about 4 weeks (around 04/25/2017) for bunion f/u.

## 2017-04-14 DIAGNOSIS — I129 Hypertensive chronic kidney disease with stage 1 through stage 4 chronic kidney disease, or unspecified chronic kidney disease: Secondary | ICD-10-CM | POA: Diagnosis not present

## 2017-04-14 DIAGNOSIS — N183 Chronic kidney disease, stage 3 (moderate): Secondary | ICD-10-CM | POA: Diagnosis not present

## 2017-04-14 DIAGNOSIS — D649 Anemia, unspecified: Secondary | ICD-10-CM | POA: Diagnosis not present

## 2017-04-14 DIAGNOSIS — G309 Alzheimer's disease, unspecified: Secondary | ICD-10-CM | POA: Diagnosis not present

## 2017-04-14 DIAGNOSIS — I4891 Unspecified atrial fibrillation: Secondary | ICD-10-CM | POA: Diagnosis not present

## 2017-04-18 DIAGNOSIS — M79675 Pain in left toe(s): Secondary | ICD-10-CM | POA: Diagnosis not present

## 2017-04-18 DIAGNOSIS — M79674 Pain in right toe(s): Secondary | ICD-10-CM | POA: Diagnosis not present

## 2017-04-18 DIAGNOSIS — Z7901 Long term (current) use of anticoagulants: Secondary | ICD-10-CM | POA: Diagnosis not present

## 2017-04-18 DIAGNOSIS — B351 Tinea unguium: Secondary | ICD-10-CM | POA: Diagnosis not present

## 2017-04-25 ENCOUNTER — Ambulatory Visit (INDEPENDENT_AMBULATORY_CARE_PROVIDER_SITE_OTHER): Payer: Medicare Other | Admitting: Podiatry

## 2017-04-25 DIAGNOSIS — M21611 Bunion of right foot: Secondary | ICD-10-CM | POA: Diagnosis not present

## 2017-04-25 DIAGNOSIS — M2011 Hallux valgus (acquired), right foot: Secondary | ICD-10-CM

## 2017-04-25 DIAGNOSIS — M21619 Bunion of unspecified foot: Secondary | ICD-10-CM | POA: Diagnosis not present

## 2017-04-25 DIAGNOSIS — M19079 Primary osteoarthritis, unspecified ankle and foot: Secondary | ICD-10-CM | POA: Diagnosis not present

## 2017-04-28 DIAGNOSIS — F419 Anxiety disorder, unspecified: Secondary | ICD-10-CM | POA: Diagnosis not present

## 2017-04-28 DIAGNOSIS — F4322 Adjustment disorder with anxiety: Secondary | ICD-10-CM | POA: Diagnosis not present

## 2017-04-28 DIAGNOSIS — F329 Major depressive disorder, single episode, unspecified: Secondary | ICD-10-CM | POA: Diagnosis not present

## 2017-04-28 DIAGNOSIS — R4189 Other symptoms and signs involving cognitive functions and awareness: Secondary | ICD-10-CM | POA: Diagnosis not present

## 2017-04-28 DIAGNOSIS — R451 Restlessness and agitation: Secondary | ICD-10-CM | POA: Diagnosis not present

## 2017-05-02 DIAGNOSIS — B372 Candidiasis of skin and nail: Secondary | ICD-10-CM | POA: Diagnosis not present

## 2017-05-02 DIAGNOSIS — R54 Age-related physical debility: Secondary | ICD-10-CM | POA: Diagnosis not present

## 2017-05-02 DIAGNOSIS — R269 Unspecified abnormalities of gait and mobility: Secondary | ICD-10-CM | POA: Diagnosis not present

## 2017-05-02 NOTE — Progress Notes (Signed)
  Subjective:  Patient ID: Carrie Smith, female    DOB: [birth year],  MRN: [id number]  No chief complaint on file.  81 y.o. female returns for the above complaint.  States that the bunion is still there.  States the injection did help somewhat  Objective:  There were no vitals filed for this visit. General AA&O x3. Normal mood and affect.  Vascular Pedal pulses palpable.  Neurologic Epicritic sensation grossly intact.  Dermatologic No open lesions. Skin normal texture and turgor.  Orthopedic: No pain to palpation either foot.   Assessment & Plan:  Patient was evaluated and treated and all questions answered.  Bunion right foot, somewhat improved -Not interested in surgical correction -Discussed that repeat injections could be given. Not interested today. -Tube foam dispensed -Follow-up as needed  Return if symptoms worsen or fail to improve.

## 2017-05-05 DIAGNOSIS — H6123 Impacted cerumen, bilateral: Secondary | ICD-10-CM | POA: Diagnosis not present

## 2017-05-05 DIAGNOSIS — H9190 Unspecified hearing loss, unspecified ear: Secondary | ICD-10-CM | POA: Diagnosis not present

## 2017-05-05 DIAGNOSIS — R54 Age-related physical debility: Secondary | ICD-10-CM | POA: Diagnosis not present

## 2017-05-19 DIAGNOSIS — F330 Major depressive disorder, recurrent, mild: Secondary | ICD-10-CM | POA: Diagnosis not present

## 2017-05-19 DIAGNOSIS — G309 Alzheimer's disease, unspecified: Secondary | ICD-10-CM | POA: Diagnosis not present

## 2017-05-19 DIAGNOSIS — G4700 Insomnia, unspecified: Secondary | ICD-10-CM | POA: Diagnosis not present

## 2017-05-25 DIAGNOSIS — J029 Acute pharyngitis, unspecified: Secondary | ICD-10-CM | POA: Diagnosis not present

## 2017-05-25 DIAGNOSIS — K219 Gastro-esophageal reflux disease without esophagitis: Secondary | ICD-10-CM | POA: Diagnosis present

## 2017-05-25 DIAGNOSIS — D3502 Benign neoplasm of left adrenal gland: Secondary | ICD-10-CM | POA: Diagnosis present

## 2017-05-25 DIAGNOSIS — N183 Chronic kidney disease, stage 3 (moderate): Secondary | ICD-10-CM | POA: Diagnosis present

## 2017-05-25 DIAGNOSIS — Z7401 Bed confinement status: Secondary | ICD-10-CM | POA: Diagnosis not present

## 2017-05-25 DIAGNOSIS — Z7901 Long term (current) use of anticoagulants: Secondary | ICD-10-CM | POA: Diagnosis not present

## 2017-05-25 DIAGNOSIS — R509 Fever, unspecified: Secondary | ICD-10-CM | POA: Diagnosis not present

## 2017-05-25 DIAGNOSIS — M109 Gout, unspecified: Secondary | ICD-10-CM | POA: Diagnosis present

## 2017-05-25 DIAGNOSIS — M6281 Muscle weakness (generalized): Secondary | ICD-10-CM | POA: Diagnosis not present

## 2017-05-25 DIAGNOSIS — J9601 Acute respiratory failure with hypoxia: Secondary | ICD-10-CM | POA: Diagnosis not present

## 2017-05-25 DIAGNOSIS — R279 Unspecified lack of coordination: Secondary | ICD-10-CM | POA: Diagnosis not present

## 2017-05-25 DIAGNOSIS — Z9981 Dependence on supplemental oxygen: Secondary | ICD-10-CM | POA: Diagnosis not present

## 2017-05-25 DIAGNOSIS — I130 Hypertensive heart and chronic kidney disease with heart failure and stage 1 through stage 4 chronic kidney disease, or unspecified chronic kidney disease: Secondary | ICD-10-CM | POA: Diagnosis not present

## 2017-05-25 DIAGNOSIS — E785 Hyperlipidemia, unspecified: Secondary | ICD-10-CM | POA: Diagnosis not present

## 2017-05-25 DIAGNOSIS — I5033 Acute on chronic diastolic (congestive) heart failure: Secondary | ICD-10-CM | POA: Diagnosis not present

## 2017-05-25 DIAGNOSIS — R69 Illness, unspecified: Secondary | ICD-10-CM | POA: Diagnosis not present

## 2017-05-25 DIAGNOSIS — R2681 Unsteadiness on feet: Secondary | ICD-10-CM | POA: Diagnosis not present

## 2017-05-25 DIAGNOSIS — R404 Transient alteration of awareness: Secondary | ICD-10-CM | POA: Diagnosis not present

## 2017-05-25 DIAGNOSIS — Z86711 Personal history of pulmonary embolism: Secondary | ICD-10-CM | POA: Diagnosis not present

## 2017-05-25 DIAGNOSIS — R05 Cough: Secondary | ICD-10-CM | POA: Diagnosis not present

## 2017-05-25 DIAGNOSIS — J9811 Atelectasis: Secondary | ICD-10-CM | POA: Diagnosis not present

## 2017-05-25 DIAGNOSIS — R41841 Cognitive communication deficit: Secondary | ICD-10-CM | POA: Diagnosis not present

## 2017-05-25 DIAGNOSIS — R52 Pain, unspecified: Secondary | ICD-10-CM | POA: Diagnosis not present

## 2017-05-25 DIAGNOSIS — E669 Obesity, unspecified: Secondary | ICD-10-CM | POA: Diagnosis present

## 2017-05-25 DIAGNOSIS — J189 Pneumonia, unspecified organism: Secondary | ICD-10-CM | POA: Diagnosis not present

## 2017-05-25 DIAGNOSIS — R278 Other lack of coordination: Secondary | ICD-10-CM | POA: Diagnosis not present

## 2017-05-25 DIAGNOSIS — R531 Weakness: Secondary | ICD-10-CM | POA: Diagnosis not present

## 2017-05-25 DIAGNOSIS — R0602 Shortness of breath: Secondary | ICD-10-CM | POA: Diagnosis not present

## 2017-05-25 DIAGNOSIS — R0902 Hypoxemia: Secondary | ICD-10-CM | POA: Diagnosis not present

## 2017-05-25 DIAGNOSIS — F0390 Unspecified dementia without behavioral disturbance: Secondary | ICD-10-CM | POA: Diagnosis not present

## 2017-05-25 DIAGNOSIS — Z79899 Other long term (current) drug therapy: Secondary | ICD-10-CM | POA: Diagnosis not present

## 2017-05-26 DIAGNOSIS — R509 Fever, unspecified: Secondary | ICD-10-CM

## 2017-06-02 DIAGNOSIS — R2681 Unsteadiness on feet: Secondary | ICD-10-CM | POA: Diagnosis not present

## 2017-06-02 DIAGNOSIS — M6281 Muscle weakness (generalized): Secondary | ICD-10-CM | POA: Diagnosis not present

## 2017-06-02 DIAGNOSIS — R279 Unspecified lack of coordination: Secondary | ICD-10-CM | POA: Diagnosis not present

## 2017-06-02 DIAGNOSIS — K5909 Other constipation: Secondary | ICD-10-CM | POA: Diagnosis not present

## 2017-06-02 DIAGNOSIS — R278 Other lack of coordination: Secondary | ICD-10-CM | POA: Diagnosis not present

## 2017-06-02 DIAGNOSIS — R0602 Shortness of breath: Secondary | ICD-10-CM | POA: Diagnosis not present

## 2017-06-02 DIAGNOSIS — M1711 Unilateral primary osteoarthritis, right knee: Secondary | ICD-10-CM | POA: Diagnosis not present

## 2017-06-02 DIAGNOSIS — J449 Chronic obstructive pulmonary disease, unspecified: Secondary | ICD-10-CM | POA: Diagnosis not present

## 2017-06-02 DIAGNOSIS — Z9181 History of falling: Secondary | ICD-10-CM | POA: Diagnosis not present

## 2017-06-02 DIAGNOSIS — M1712 Unilateral primary osteoarthritis, left knee: Secondary | ICD-10-CM | POA: Diagnosis not present

## 2017-06-02 DIAGNOSIS — R41841 Cognitive communication deficit: Secondary | ICD-10-CM | POA: Diagnosis not present

## 2017-06-02 DIAGNOSIS — J9601 Acute respiratory failure with hypoxia: Secondary | ICD-10-CM | POA: Diagnosis not present

## 2017-06-02 DIAGNOSIS — N179 Acute kidney failure, unspecified: Secondary | ICD-10-CM | POA: Diagnosis not present

## 2017-06-02 DIAGNOSIS — I5033 Acute on chronic diastolic (congestive) heart failure: Secondary | ICD-10-CM | POA: Diagnosis not present

## 2017-06-02 DIAGNOSIS — I129 Hypertensive chronic kidney disease with stage 1 through stage 4 chronic kidney disease, or unspecified chronic kidney disease: Secondary | ICD-10-CM | POA: Diagnosis not present

## 2017-06-02 DIAGNOSIS — J181 Lobar pneumonia, unspecified organism: Secondary | ICD-10-CM | POA: Diagnosis not present

## 2017-06-02 DIAGNOSIS — J189 Pneumonia, unspecified organism: Secondary | ICD-10-CM | POA: Diagnosis not present

## 2017-06-02 DIAGNOSIS — E785 Hyperlipidemia, unspecified: Secondary | ICD-10-CM | POA: Diagnosis not present

## 2017-06-02 DIAGNOSIS — Z7401 Bed confinement status: Secondary | ICD-10-CM | POA: Diagnosis not present

## 2017-06-02 DIAGNOSIS — I130 Hypertensive heart and chronic kidney disease with heart failure and stage 1 through stage 4 chronic kidney disease, or unspecified chronic kidney disease: Secondary | ICD-10-CM | POA: Diagnosis not present

## 2017-06-02 DIAGNOSIS — R69 Illness, unspecified: Secondary | ICD-10-CM | POA: Diagnosis not present

## 2017-06-02 DIAGNOSIS — R5381 Other malaise: Secondary | ICD-10-CM | POA: Diagnosis not present

## 2017-06-04 DIAGNOSIS — N179 Acute kidney failure, unspecified: Secondary | ICD-10-CM | POA: Diagnosis not present

## 2017-06-04 DIAGNOSIS — J449 Chronic obstructive pulmonary disease, unspecified: Secondary | ICD-10-CM | POA: Diagnosis not present

## 2017-06-04 DIAGNOSIS — M1712 Unilateral primary osteoarthritis, left knee: Secondary | ICD-10-CM | POA: Diagnosis not present

## 2017-06-04 DIAGNOSIS — J189 Pneumonia, unspecified organism: Secondary | ICD-10-CM | POA: Diagnosis not present

## 2017-06-04 DIAGNOSIS — K5909 Other constipation: Secondary | ICD-10-CM | POA: Diagnosis not present

## 2017-06-04 DIAGNOSIS — R5381 Other malaise: Secondary | ICD-10-CM | POA: Diagnosis not present

## 2017-06-04 DIAGNOSIS — M1711 Unilateral primary osteoarthritis, right knee: Secondary | ICD-10-CM | POA: Diagnosis not present

## 2017-06-24 DIAGNOSIS — J189 Pneumonia, unspecified organism: Secondary | ICD-10-CM | POA: Diagnosis not present

## 2017-06-24 DIAGNOSIS — M6281 Muscle weakness (generalized): Secondary | ICD-10-CM | POA: Diagnosis not present

## 2017-06-24 DIAGNOSIS — R05 Cough: Secondary | ICD-10-CM | POA: Diagnosis not present

## 2017-06-24 DIAGNOSIS — J9611 Chronic respiratory failure with hypoxia: Secondary | ICD-10-CM | POA: Diagnosis not present

## 2017-06-24 DIAGNOSIS — J449 Chronic obstructive pulmonary disease, unspecified: Secondary | ICD-10-CM | POA: Diagnosis not present

## 2017-06-24 DIAGNOSIS — I5033 Acute on chronic diastolic (congestive) heart failure: Secondary | ICD-10-CM | POA: Diagnosis not present

## 2017-06-24 DIAGNOSIS — R269 Unspecified abnormalities of gait and mobility: Secondary | ICD-10-CM | POA: Diagnosis not present

## 2017-06-24 DIAGNOSIS — R54 Age-related physical debility: Secondary | ICD-10-CM | POA: Diagnosis not present

## 2017-06-25 DIAGNOSIS — R05 Cough: Secondary | ICD-10-CM | POA: Diagnosis not present

## 2017-06-26 DIAGNOSIS — I5032 Chronic diastolic (congestive) heart failure: Secondary | ICD-10-CM | POA: Diagnosis not present

## 2017-06-26 DIAGNOSIS — R69 Illness, unspecified: Secondary | ICD-10-CM | POA: Diagnosis not present

## 2017-06-26 DIAGNOSIS — I2699 Other pulmonary embolism without acute cor pulmonale: Secondary | ICD-10-CM | POA: Diagnosis not present

## 2017-06-26 DIAGNOSIS — J9611 Chronic respiratory failure with hypoxia: Secondary | ICD-10-CM | POA: Diagnosis not present

## 2017-06-26 DIAGNOSIS — I130 Hypertensive heart and chronic kidney disease with heart failure and stage 1 through stage 4 chronic kidney disease, or unspecified chronic kidney disease: Secondary | ICD-10-CM | POA: Diagnosis not present

## 2017-06-26 DIAGNOSIS — J441 Chronic obstructive pulmonary disease with (acute) exacerbation: Secondary | ICD-10-CM | POA: Diagnosis not present

## 2017-06-26 DIAGNOSIS — N183 Chronic kidney disease, stage 3 (moderate): Secondary | ICD-10-CM | POA: Diagnosis not present

## 2017-06-26 DIAGNOSIS — G309 Alzheimer's disease, unspecified: Secondary | ICD-10-CM | POA: Diagnosis not present

## 2017-06-28 DIAGNOSIS — N183 Chronic kidney disease, stage 3 (moderate): Secondary | ICD-10-CM | POA: Diagnosis not present

## 2017-06-28 DIAGNOSIS — R69 Illness, unspecified: Secondary | ICD-10-CM | POA: Diagnosis not present

## 2017-06-28 DIAGNOSIS — I130 Hypertensive heart and chronic kidney disease with heart failure and stage 1 through stage 4 chronic kidney disease, or unspecified chronic kidney disease: Secondary | ICD-10-CM | POA: Diagnosis not present

## 2017-06-28 DIAGNOSIS — F4322 Adjustment disorder with anxiety: Secondary | ICD-10-CM | POA: Diagnosis not present

## 2017-06-28 DIAGNOSIS — J441 Chronic obstructive pulmonary disease with (acute) exacerbation: Secondary | ICD-10-CM | POA: Diagnosis not present

## 2017-06-28 DIAGNOSIS — J9611 Chronic respiratory failure with hypoxia: Secondary | ICD-10-CM | POA: Diagnosis not present

## 2017-06-28 DIAGNOSIS — I2699 Other pulmonary embolism without acute cor pulmonale: Secondary | ICD-10-CM | POA: Diagnosis not present

## 2017-06-28 DIAGNOSIS — G309 Alzheimer's disease, unspecified: Secondary | ICD-10-CM | POA: Diagnosis not present

## 2017-06-28 DIAGNOSIS — F329 Major depressive disorder, single episode, unspecified: Secondary | ICD-10-CM | POA: Diagnosis not present

## 2017-06-28 DIAGNOSIS — I5032 Chronic diastolic (congestive) heart failure: Secondary | ICD-10-CM | POA: Diagnosis not present

## 2017-07-01 DIAGNOSIS — N183 Chronic kidney disease, stage 3 (moderate): Secondary | ICD-10-CM | POA: Diagnosis not present

## 2017-07-01 DIAGNOSIS — I5032 Chronic diastolic (congestive) heart failure: Secondary | ICD-10-CM | POA: Diagnosis not present

## 2017-07-01 DIAGNOSIS — I130 Hypertensive heart and chronic kidney disease with heart failure and stage 1 through stage 4 chronic kidney disease, or unspecified chronic kidney disease: Secondary | ICD-10-CM | POA: Diagnosis not present

## 2017-07-01 DIAGNOSIS — J441 Chronic obstructive pulmonary disease with (acute) exacerbation: Secondary | ICD-10-CM | POA: Diagnosis not present

## 2017-07-01 DIAGNOSIS — J9611 Chronic respiratory failure with hypoxia: Secondary | ICD-10-CM | POA: Diagnosis not present

## 2017-07-01 DIAGNOSIS — G309 Alzheimer's disease, unspecified: Secondary | ICD-10-CM | POA: Diagnosis not present

## 2017-07-01 DIAGNOSIS — R69 Illness, unspecified: Secondary | ICD-10-CM | POA: Diagnosis not present

## 2017-07-01 DIAGNOSIS — I2699 Other pulmonary embolism without acute cor pulmonale: Secondary | ICD-10-CM | POA: Diagnosis not present

## 2017-07-04 DIAGNOSIS — R69 Illness, unspecified: Secondary | ICD-10-CM | POA: Diagnosis not present

## 2017-07-04 DIAGNOSIS — J9611 Chronic respiratory failure with hypoxia: Secondary | ICD-10-CM | POA: Diagnosis not present

## 2017-07-04 DIAGNOSIS — I130 Hypertensive heart and chronic kidney disease with heart failure and stage 1 through stage 4 chronic kidney disease, or unspecified chronic kidney disease: Secondary | ICD-10-CM | POA: Diagnosis not present

## 2017-07-04 DIAGNOSIS — I5032 Chronic diastolic (congestive) heart failure: Secondary | ICD-10-CM | POA: Diagnosis not present

## 2017-07-04 DIAGNOSIS — N183 Chronic kidney disease, stage 3 (moderate): Secondary | ICD-10-CM | POA: Diagnosis not present

## 2017-07-04 DIAGNOSIS — G309 Alzheimer's disease, unspecified: Secondary | ICD-10-CM | POA: Diagnosis not present

## 2017-07-04 DIAGNOSIS — J441 Chronic obstructive pulmonary disease with (acute) exacerbation: Secondary | ICD-10-CM | POA: Diagnosis not present

## 2017-07-04 DIAGNOSIS — I2699 Other pulmonary embolism without acute cor pulmonale: Secondary | ICD-10-CM | POA: Diagnosis not present

## 2017-07-05 DIAGNOSIS — I5032 Chronic diastolic (congestive) heart failure: Secondary | ICD-10-CM | POA: Diagnosis not present

## 2017-07-05 DIAGNOSIS — R69 Illness, unspecified: Secondary | ICD-10-CM | POA: Diagnosis not present

## 2017-07-05 DIAGNOSIS — J9611 Chronic respiratory failure with hypoxia: Secondary | ICD-10-CM | POA: Diagnosis not present

## 2017-07-05 DIAGNOSIS — G309 Alzheimer's disease, unspecified: Secondary | ICD-10-CM | POA: Diagnosis not present

## 2017-07-05 DIAGNOSIS — R05 Cough: Secondary | ICD-10-CM | POA: Diagnosis not present

## 2017-07-05 DIAGNOSIS — I130 Hypertensive heart and chronic kidney disease with heart failure and stage 1 through stage 4 chronic kidney disease, or unspecified chronic kidney disease: Secondary | ICD-10-CM | POA: Diagnosis not present

## 2017-07-05 DIAGNOSIS — J441 Chronic obstructive pulmonary disease with (acute) exacerbation: Secondary | ICD-10-CM | POA: Diagnosis not present

## 2017-07-05 DIAGNOSIS — N183 Chronic kidney disease, stage 3 (moderate): Secondary | ICD-10-CM | POA: Diagnosis not present

## 2017-07-05 DIAGNOSIS — I2699 Other pulmonary embolism without acute cor pulmonale: Secondary | ICD-10-CM | POA: Diagnosis not present

## 2017-07-06 DIAGNOSIS — J9611 Chronic respiratory failure with hypoxia: Secondary | ICD-10-CM | POA: Diagnosis not present

## 2017-07-06 DIAGNOSIS — I5032 Chronic diastolic (congestive) heart failure: Secondary | ICD-10-CM | POA: Diagnosis not present

## 2017-07-06 DIAGNOSIS — R69 Illness, unspecified: Secondary | ICD-10-CM | POA: Diagnosis not present

## 2017-07-06 DIAGNOSIS — N183 Chronic kidney disease, stage 3 (moderate): Secondary | ICD-10-CM | POA: Diagnosis not present

## 2017-07-06 DIAGNOSIS — R5383 Other fatigue: Secondary | ICD-10-CM | POA: Diagnosis not present

## 2017-07-06 DIAGNOSIS — R062 Wheezing: Secondary | ICD-10-CM | POA: Diagnosis not present

## 2017-07-06 DIAGNOSIS — I130 Hypertensive heart and chronic kidney disease with heart failure and stage 1 through stage 4 chronic kidney disease, or unspecified chronic kidney disease: Secondary | ICD-10-CM | POA: Diagnosis not present

## 2017-07-06 DIAGNOSIS — J189 Pneumonia, unspecified organism: Secondary | ICD-10-CM | POA: Diagnosis not present

## 2017-07-06 DIAGNOSIS — J441 Chronic obstructive pulmonary disease with (acute) exacerbation: Secondary | ICD-10-CM | POA: Diagnosis not present

## 2017-07-06 DIAGNOSIS — G309 Alzheimer's disease, unspecified: Secondary | ICD-10-CM | POA: Diagnosis not present

## 2017-07-06 DIAGNOSIS — I2699 Other pulmonary embolism without acute cor pulmonale: Secondary | ICD-10-CM | POA: Diagnosis not present

## 2017-07-07 DIAGNOSIS — J9611 Chronic respiratory failure with hypoxia: Secondary | ICD-10-CM | POA: Diagnosis not present

## 2017-07-07 DIAGNOSIS — G309 Alzheimer's disease, unspecified: Secondary | ICD-10-CM | POA: Diagnosis not present

## 2017-07-07 DIAGNOSIS — N183 Chronic kidney disease, stage 3 (moderate): Secondary | ICD-10-CM | POA: Diagnosis not present

## 2017-07-07 DIAGNOSIS — R69 Illness, unspecified: Secondary | ICD-10-CM | POA: Diagnosis not present

## 2017-07-07 DIAGNOSIS — I130 Hypertensive heart and chronic kidney disease with heart failure and stage 1 through stage 4 chronic kidney disease, or unspecified chronic kidney disease: Secondary | ICD-10-CM | POA: Diagnosis not present

## 2017-07-07 DIAGNOSIS — J441 Chronic obstructive pulmonary disease with (acute) exacerbation: Secondary | ICD-10-CM | POA: Diagnosis not present

## 2017-07-07 DIAGNOSIS — I2699 Other pulmonary embolism without acute cor pulmonale: Secondary | ICD-10-CM | POA: Diagnosis not present

## 2017-07-07 DIAGNOSIS — I5032 Chronic diastolic (congestive) heart failure: Secondary | ICD-10-CM | POA: Diagnosis not present

## 2017-07-08 DIAGNOSIS — I2699 Other pulmonary embolism without acute cor pulmonale: Secondary | ICD-10-CM | POA: Diagnosis not present

## 2017-07-08 DIAGNOSIS — N183 Chronic kidney disease, stage 3 (moderate): Secondary | ICD-10-CM | POA: Diagnosis not present

## 2017-07-08 DIAGNOSIS — I5032 Chronic diastolic (congestive) heart failure: Secondary | ICD-10-CM | POA: Diagnosis not present

## 2017-07-08 DIAGNOSIS — G309 Alzheimer's disease, unspecified: Secondary | ICD-10-CM | POA: Diagnosis not present

## 2017-07-08 DIAGNOSIS — J9611 Chronic respiratory failure with hypoxia: Secondary | ICD-10-CM | POA: Diagnosis not present

## 2017-07-08 DIAGNOSIS — J441 Chronic obstructive pulmonary disease with (acute) exacerbation: Secondary | ICD-10-CM | POA: Diagnosis not present

## 2017-07-08 DIAGNOSIS — R69 Illness, unspecified: Secondary | ICD-10-CM | POA: Diagnosis not present

## 2017-07-08 DIAGNOSIS — I130 Hypertensive heart and chronic kidney disease with heart failure and stage 1 through stage 4 chronic kidney disease, or unspecified chronic kidney disease: Secondary | ICD-10-CM | POA: Diagnosis not present

## 2017-07-11 DIAGNOSIS — I5032 Chronic diastolic (congestive) heart failure: Secondary | ICD-10-CM | POA: Diagnosis not present

## 2017-07-11 DIAGNOSIS — I2699 Other pulmonary embolism without acute cor pulmonale: Secondary | ICD-10-CM | POA: Diagnosis not present

## 2017-07-11 DIAGNOSIS — N183 Chronic kidney disease, stage 3 (moderate): Secondary | ICD-10-CM | POA: Diagnosis not present

## 2017-07-11 DIAGNOSIS — R69 Illness, unspecified: Secondary | ICD-10-CM | POA: Diagnosis not present

## 2017-07-11 DIAGNOSIS — J9611 Chronic respiratory failure with hypoxia: Secondary | ICD-10-CM | POA: Diagnosis not present

## 2017-07-11 DIAGNOSIS — J441 Chronic obstructive pulmonary disease with (acute) exacerbation: Secondary | ICD-10-CM | POA: Diagnosis not present

## 2017-07-11 DIAGNOSIS — I130 Hypertensive heart and chronic kidney disease with heart failure and stage 1 through stage 4 chronic kidney disease, or unspecified chronic kidney disease: Secondary | ICD-10-CM | POA: Diagnosis not present

## 2017-07-11 DIAGNOSIS — G309 Alzheimer's disease, unspecified: Secondary | ICD-10-CM | POA: Diagnosis not present

## 2017-07-14 DIAGNOSIS — I5032 Chronic diastolic (congestive) heart failure: Secondary | ICD-10-CM | POA: Diagnosis not present

## 2017-07-14 DIAGNOSIS — J9611 Chronic respiratory failure with hypoxia: Secondary | ICD-10-CM | POA: Diagnosis not present

## 2017-07-14 DIAGNOSIS — G309 Alzheimer's disease, unspecified: Secondary | ICD-10-CM | POA: Diagnosis not present

## 2017-07-14 DIAGNOSIS — M25512 Pain in left shoulder: Secondary | ICD-10-CM | POA: Diagnosis not present

## 2017-07-14 DIAGNOSIS — N183 Chronic kidney disease, stage 3 (moderate): Secondary | ICD-10-CM | POA: Diagnosis not present

## 2017-07-14 DIAGNOSIS — M1990 Unspecified osteoarthritis, unspecified site: Secondary | ICD-10-CM | POA: Diagnosis not present

## 2017-07-14 DIAGNOSIS — J441 Chronic obstructive pulmonary disease with (acute) exacerbation: Secondary | ICD-10-CM | POA: Diagnosis not present

## 2017-07-14 DIAGNOSIS — I130 Hypertensive heart and chronic kidney disease with heart failure and stage 1 through stage 4 chronic kidney disease, or unspecified chronic kidney disease: Secondary | ICD-10-CM | POA: Diagnosis not present

## 2017-07-14 DIAGNOSIS — R69 Illness, unspecified: Secondary | ICD-10-CM | POA: Diagnosis not present

## 2017-07-14 DIAGNOSIS — R54 Age-related physical debility: Secondary | ICD-10-CM | POA: Diagnosis not present

## 2017-07-14 DIAGNOSIS — I2699 Other pulmonary embolism without acute cor pulmonale: Secondary | ICD-10-CM | POA: Diagnosis not present

## 2017-07-15 DIAGNOSIS — R2681 Unsteadiness on feet: Secondary | ICD-10-CM | POA: Diagnosis not present

## 2017-07-15 DIAGNOSIS — M6281 Muscle weakness (generalized): Secondary | ICD-10-CM | POA: Diagnosis not present

## 2017-07-15 DIAGNOSIS — I129 Hypertensive chronic kidney disease with stage 1 through stage 4 chronic kidney disease, or unspecified chronic kidney disease: Secondary | ICD-10-CM | POA: Diagnosis not present

## 2017-07-15 DIAGNOSIS — Z9181 History of falling: Secondary | ICD-10-CM | POA: Diagnosis not present

## 2017-07-19 DIAGNOSIS — R600 Localized edema: Secondary | ICD-10-CM | POA: Diagnosis not present

## 2017-07-19 DIAGNOSIS — J441 Chronic obstructive pulmonary disease with (acute) exacerbation: Secondary | ICD-10-CM | POA: Diagnosis not present

## 2017-07-19 DIAGNOSIS — J9611 Chronic respiratory failure with hypoxia: Secondary | ICD-10-CM | POA: Diagnosis not present

## 2017-07-19 DIAGNOSIS — I130 Hypertensive heart and chronic kidney disease with heart failure and stage 1 through stage 4 chronic kidney disease, or unspecified chronic kidney disease: Secondary | ICD-10-CM | POA: Diagnosis not present

## 2017-07-19 DIAGNOSIS — G309 Alzheimer's disease, unspecified: Secondary | ICD-10-CM | POA: Diagnosis not present

## 2017-07-19 DIAGNOSIS — R54 Age-related physical debility: Secondary | ICD-10-CM | POA: Diagnosis not present

## 2017-07-19 DIAGNOSIS — I5032 Chronic diastolic (congestive) heart failure: Secondary | ICD-10-CM | POA: Diagnosis not present

## 2017-07-19 DIAGNOSIS — I5033 Acute on chronic diastolic (congestive) heart failure: Secondary | ICD-10-CM | POA: Diagnosis not present

## 2017-07-19 DIAGNOSIS — N183 Chronic kidney disease, stage 3 (moderate): Secondary | ICD-10-CM | POA: Diagnosis not present

## 2017-07-19 DIAGNOSIS — R69 Illness, unspecified: Secondary | ICD-10-CM | POA: Diagnosis not present

## 2017-07-19 DIAGNOSIS — I2699 Other pulmonary embolism without acute cor pulmonale: Secondary | ICD-10-CM | POA: Diagnosis not present

## 2017-07-21 DIAGNOSIS — R2681 Unsteadiness on feet: Secondary | ICD-10-CM | POA: Diagnosis not present

## 2017-07-21 DIAGNOSIS — Z9181 History of falling: Secondary | ICD-10-CM | POA: Diagnosis not present

## 2017-07-21 DIAGNOSIS — R69 Illness, unspecified: Secondary | ICD-10-CM | POA: Diagnosis not present

## 2017-07-21 DIAGNOSIS — I5032 Chronic diastolic (congestive) heart failure: Secondary | ICD-10-CM | POA: Diagnosis not present

## 2017-07-21 DIAGNOSIS — G309 Alzheimer's disease, unspecified: Secondary | ICD-10-CM | POA: Diagnosis not present

## 2017-07-21 DIAGNOSIS — I129 Hypertensive chronic kidney disease with stage 1 through stage 4 chronic kidney disease, or unspecified chronic kidney disease: Secondary | ICD-10-CM | POA: Diagnosis not present

## 2017-07-21 DIAGNOSIS — N183 Chronic kidney disease, stage 3 (moderate): Secondary | ICD-10-CM | POA: Diagnosis not present

## 2017-07-21 DIAGNOSIS — J441 Chronic obstructive pulmonary disease with (acute) exacerbation: Secondary | ICD-10-CM | POA: Diagnosis not present

## 2017-07-21 DIAGNOSIS — I2699 Other pulmonary embolism without acute cor pulmonale: Secondary | ICD-10-CM | POA: Diagnosis not present

## 2017-07-21 DIAGNOSIS — I5033 Acute on chronic diastolic (congestive) heart failure: Secondary | ICD-10-CM | POA: Diagnosis not present

## 2017-07-21 DIAGNOSIS — I130 Hypertensive heart and chronic kidney disease with heart failure and stage 1 through stage 4 chronic kidney disease, or unspecified chronic kidney disease: Secondary | ICD-10-CM | POA: Diagnosis not present

## 2017-07-21 DIAGNOSIS — J9611 Chronic respiratory failure with hypoxia: Secondary | ICD-10-CM | POA: Diagnosis not present

## 2017-07-21 DIAGNOSIS — J181 Lobar pneumonia, unspecified organism: Secondary | ICD-10-CM | POA: Diagnosis not present

## 2017-07-21 DIAGNOSIS — M6281 Muscle weakness (generalized): Secondary | ICD-10-CM | POA: Diagnosis not present

## 2017-07-25 DIAGNOSIS — J441 Chronic obstructive pulmonary disease with (acute) exacerbation: Secondary | ICD-10-CM | POA: Diagnosis not present

## 2017-07-25 DIAGNOSIS — I2699 Other pulmonary embolism without acute cor pulmonale: Secondary | ICD-10-CM | POA: Diagnosis not present

## 2017-07-25 DIAGNOSIS — R69 Illness, unspecified: Secondary | ICD-10-CM | POA: Diagnosis not present

## 2017-07-25 DIAGNOSIS — G309 Alzheimer's disease, unspecified: Secondary | ICD-10-CM | POA: Diagnosis not present

## 2017-07-25 DIAGNOSIS — I5032 Chronic diastolic (congestive) heart failure: Secondary | ICD-10-CM | POA: Diagnosis not present

## 2017-07-25 DIAGNOSIS — I130 Hypertensive heart and chronic kidney disease with heart failure and stage 1 through stage 4 chronic kidney disease, or unspecified chronic kidney disease: Secondary | ICD-10-CM | POA: Diagnosis not present

## 2017-07-25 DIAGNOSIS — N183 Chronic kidney disease, stage 3 (moderate): Secondary | ICD-10-CM | POA: Diagnosis not present

## 2017-07-25 DIAGNOSIS — J9611 Chronic respiratory failure with hypoxia: Secondary | ICD-10-CM | POA: Diagnosis not present

## 2017-07-27 DIAGNOSIS — I130 Hypertensive heart and chronic kidney disease with heart failure and stage 1 through stage 4 chronic kidney disease, or unspecified chronic kidney disease: Secondary | ICD-10-CM | POA: Diagnosis not present

## 2017-07-27 DIAGNOSIS — J9611 Chronic respiratory failure with hypoxia: Secondary | ICD-10-CM | POA: Diagnosis not present

## 2017-07-27 DIAGNOSIS — G309 Alzheimer's disease, unspecified: Secondary | ICD-10-CM | POA: Diagnosis not present

## 2017-07-27 DIAGNOSIS — R69 Illness, unspecified: Secondary | ICD-10-CM | POA: Diagnosis not present

## 2017-07-27 DIAGNOSIS — I5032 Chronic diastolic (congestive) heart failure: Secondary | ICD-10-CM | POA: Diagnosis not present

## 2017-07-27 DIAGNOSIS — N183 Chronic kidney disease, stage 3 (moderate): Secondary | ICD-10-CM | POA: Diagnosis not present

## 2017-07-27 DIAGNOSIS — I2699 Other pulmonary embolism without acute cor pulmonale: Secondary | ICD-10-CM | POA: Diagnosis not present

## 2017-07-27 DIAGNOSIS — J441 Chronic obstructive pulmonary disease with (acute) exacerbation: Secondary | ICD-10-CM | POA: Diagnosis not present

## 2017-07-28 DIAGNOSIS — I130 Hypertensive heart and chronic kidney disease with heart failure and stage 1 through stage 4 chronic kidney disease, or unspecified chronic kidney disease: Secondary | ICD-10-CM | POA: Diagnosis not present

## 2017-07-28 DIAGNOSIS — E559 Vitamin D deficiency, unspecified: Secondary | ICD-10-CM | POA: Diagnosis not present

## 2017-07-28 DIAGNOSIS — F4322 Adjustment disorder with anxiety: Secondary | ICD-10-CM | POA: Diagnosis not present

## 2017-07-28 DIAGNOSIS — K219 Gastro-esophageal reflux disease without esophagitis: Secondary | ICD-10-CM | POA: Diagnosis not present

## 2017-07-28 DIAGNOSIS — J449 Chronic obstructive pulmonary disease, unspecified: Secondary | ICD-10-CM | POA: Diagnosis not present

## 2017-07-28 DIAGNOSIS — I5032 Chronic diastolic (congestive) heart failure: Secondary | ICD-10-CM | POA: Diagnosis not present

## 2017-07-28 DIAGNOSIS — G309 Alzheimer's disease, unspecified: Secondary | ICD-10-CM | POA: Diagnosis not present

## 2017-07-28 DIAGNOSIS — R69 Illness, unspecified: Secondary | ICD-10-CM | POA: Diagnosis not present

## 2017-07-28 DIAGNOSIS — J9611 Chronic respiratory failure with hypoxia: Secondary | ICD-10-CM | POA: Diagnosis not present

## 2017-07-28 DIAGNOSIS — I2699 Other pulmonary embolism without acute cor pulmonale: Secondary | ICD-10-CM | POA: Diagnosis not present

## 2017-07-28 DIAGNOSIS — J441 Chronic obstructive pulmonary disease with (acute) exacerbation: Secondary | ICD-10-CM | POA: Diagnosis not present

## 2017-07-28 DIAGNOSIS — F329 Major depressive disorder, single episode, unspecified: Secondary | ICD-10-CM | POA: Diagnosis not present

## 2017-07-28 DIAGNOSIS — N183 Chronic kidney disease, stage 3 (moderate): Secondary | ICD-10-CM | POA: Diagnosis not present

## 2017-08-01 DIAGNOSIS — I2699 Other pulmonary embolism without acute cor pulmonale: Secondary | ICD-10-CM | POA: Diagnosis not present

## 2017-08-01 DIAGNOSIS — I5032 Chronic diastolic (congestive) heart failure: Secondary | ICD-10-CM | POA: Diagnosis not present

## 2017-08-01 DIAGNOSIS — I130 Hypertensive heart and chronic kidney disease with heart failure and stage 1 through stage 4 chronic kidney disease, or unspecified chronic kidney disease: Secondary | ICD-10-CM | POA: Diagnosis not present

## 2017-08-01 DIAGNOSIS — M79662 Pain in left lower leg: Secondary | ICD-10-CM | POA: Diagnosis not present

## 2017-08-01 DIAGNOSIS — N183 Chronic kidney disease, stage 3 (moderate): Secondary | ICD-10-CM | POA: Diagnosis not present

## 2017-08-01 DIAGNOSIS — J441 Chronic obstructive pulmonary disease with (acute) exacerbation: Secondary | ICD-10-CM | POA: Diagnosis not present

## 2017-08-01 DIAGNOSIS — G309 Alzheimer's disease, unspecified: Secondary | ICD-10-CM | POA: Diagnosis not present

## 2017-08-01 DIAGNOSIS — L539 Erythematous condition, unspecified: Secondary | ICD-10-CM | POA: Diagnosis not present

## 2017-08-01 DIAGNOSIS — R69 Illness, unspecified: Secondary | ICD-10-CM | POA: Diagnosis not present

## 2017-08-01 DIAGNOSIS — J9611 Chronic respiratory failure with hypoxia: Secondary | ICD-10-CM | POA: Diagnosis not present

## 2017-08-02 DIAGNOSIS — R600 Localized edema: Secondary | ICD-10-CM | POA: Diagnosis not present

## 2017-08-11 DIAGNOSIS — R600 Localized edema: Secondary | ICD-10-CM | POA: Diagnosis not present

## 2017-08-11 DIAGNOSIS — R54 Age-related physical debility: Secondary | ICD-10-CM | POA: Diagnosis not present

## 2017-08-11 DIAGNOSIS — I5033 Acute on chronic diastolic (congestive) heart failure: Secondary | ICD-10-CM | POA: Diagnosis not present

## 2017-08-12 DIAGNOSIS — M6281 Muscle weakness (generalized): Secondary | ICD-10-CM | POA: Diagnosis not present

## 2017-08-12 DIAGNOSIS — Z9181 History of falling: Secondary | ICD-10-CM | POA: Diagnosis not present

## 2017-08-12 DIAGNOSIS — R2681 Unsteadiness on feet: Secondary | ICD-10-CM | POA: Diagnosis not present

## 2017-08-12 DIAGNOSIS — I129 Hypertensive chronic kidney disease with stage 1 through stage 4 chronic kidney disease, or unspecified chronic kidney disease: Secondary | ICD-10-CM | POA: Diagnosis not present

## 2017-08-16 DIAGNOSIS — R69 Illness, unspecified: Secondary | ICD-10-CM | POA: Diagnosis not present

## 2017-08-17 DIAGNOSIS — R69 Illness, unspecified: Secondary | ICD-10-CM | POA: Diagnosis not present

## 2017-08-17 DIAGNOSIS — G309 Alzheimer's disease, unspecified: Secondary | ICD-10-CM | POA: Diagnosis not present

## 2017-08-17 DIAGNOSIS — J9611 Chronic respiratory failure with hypoxia: Secondary | ICD-10-CM | POA: Diagnosis not present

## 2017-08-17 DIAGNOSIS — I5032 Chronic diastolic (congestive) heart failure: Secondary | ICD-10-CM | POA: Diagnosis not present

## 2017-08-17 DIAGNOSIS — I130 Hypertensive heart and chronic kidney disease with heart failure and stage 1 through stage 4 chronic kidney disease, or unspecified chronic kidney disease: Secondary | ICD-10-CM | POA: Diagnosis not present

## 2017-08-17 DIAGNOSIS — N183 Chronic kidney disease, stage 3 (moderate): Secondary | ICD-10-CM | POA: Diagnosis not present

## 2017-08-17 DIAGNOSIS — I2699 Other pulmonary embolism without acute cor pulmonale: Secondary | ICD-10-CM | POA: Diagnosis not present

## 2017-08-17 DIAGNOSIS — J441 Chronic obstructive pulmonary disease with (acute) exacerbation: Secondary | ICD-10-CM | POA: Diagnosis not present

## 2017-08-18 DIAGNOSIS — I5033 Acute on chronic diastolic (congestive) heart failure: Secondary | ICD-10-CM | POA: Diagnosis not present

## 2017-08-18 DIAGNOSIS — Z9181 History of falling: Secondary | ICD-10-CM | POA: Diagnosis not present

## 2017-08-18 DIAGNOSIS — M6281 Muscle weakness (generalized): Secondary | ICD-10-CM | POA: Diagnosis not present

## 2017-08-18 DIAGNOSIS — M79605 Pain in left leg: Secondary | ICD-10-CM | POA: Diagnosis not present

## 2017-08-18 DIAGNOSIS — R2681 Unsteadiness on feet: Secondary | ICD-10-CM | POA: Diagnosis not present

## 2017-08-18 DIAGNOSIS — J181 Lobar pneumonia, unspecified organism: Secondary | ICD-10-CM | POA: Diagnosis not present

## 2017-08-18 DIAGNOSIS — I129 Hypertensive chronic kidney disease with stage 1 through stage 4 chronic kidney disease, or unspecified chronic kidney disease: Secondary | ICD-10-CM | POA: Diagnosis not present

## 2017-08-18 DIAGNOSIS — R600 Localized edema: Secondary | ICD-10-CM | POA: Diagnosis not present

## 2017-08-20 DIAGNOSIS — R0602 Shortness of breath: Secondary | ICD-10-CM | POA: Diagnosis not present

## 2017-08-20 DIAGNOSIS — G459 Transient cerebral ischemic attack, unspecified: Secondary | ICD-10-CM | POA: Diagnosis not present

## 2017-08-20 DIAGNOSIS — R05 Cough: Secondary | ICD-10-CM | POA: Diagnosis not present

## 2017-08-21 DIAGNOSIS — J9690 Respiratory failure, unspecified, unspecified whether with hypoxia or hypercapnia: Secondary | ICD-10-CM | POA: Diagnosis not present

## 2017-08-21 DIAGNOSIS — K219 Gastro-esophageal reflux disease without esophagitis: Secondary | ICD-10-CM | POA: Diagnosis not present

## 2017-08-21 DIAGNOSIS — I5033 Acute on chronic diastolic (congestive) heart failure: Secondary | ICD-10-CM | POA: Diagnosis not present

## 2017-08-21 DIAGNOSIS — R69 Illness, unspecified: Secondary | ICD-10-CM | POA: Diagnosis not present

## 2017-08-21 DIAGNOSIS — J690 Pneumonitis due to inhalation of food and vomit: Secondary | ICD-10-CM | POA: Diagnosis not present

## 2017-08-21 DIAGNOSIS — R0602 Shortness of breath: Secondary | ICD-10-CM | POA: Diagnosis not present

## 2017-08-21 DIAGNOSIS — I2699 Other pulmonary embolism without acute cor pulmonale: Secondary | ICD-10-CM | POA: Diagnosis not present

## 2017-08-21 DIAGNOSIS — J9621 Acute and chronic respiratory failure with hypoxia: Secondary | ICD-10-CM | POA: Diagnosis not present

## 2017-08-21 DIAGNOSIS — R531 Weakness: Secondary | ICD-10-CM | POA: Diagnosis not present

## 2017-08-21 DIAGNOSIS — Z7401 Bed confinement status: Secondary | ICD-10-CM | POA: Diagnosis not present

## 2017-08-21 DIAGNOSIS — J189 Pneumonia, unspecified organism: Secondary | ICD-10-CM | POA: Diagnosis not present

## 2017-08-21 DIAGNOSIS — R279 Unspecified lack of coordination: Secondary | ICD-10-CM | POA: Diagnosis not present

## 2017-08-21 DIAGNOSIS — N183 Chronic kidney disease, stage 3 (moderate): Secondary | ICD-10-CM | POA: Diagnosis not present

## 2017-08-21 DIAGNOSIS — I69354 Hemiplegia and hemiparesis following cerebral infarction affecting left non-dominant side: Secondary | ICD-10-CM | POA: Diagnosis not present

## 2017-08-21 DIAGNOSIS — R4182 Altered mental status, unspecified: Secondary | ICD-10-CM | POA: Diagnosis not present

## 2017-08-21 DIAGNOSIS — J449 Chronic obstructive pulmonary disease, unspecified: Secondary | ICD-10-CM | POA: Diagnosis not present

## 2017-08-21 DIAGNOSIS — R0603 Acute respiratory distress: Secondary | ICD-10-CM | POA: Diagnosis not present

## 2017-08-21 DIAGNOSIS — I69898 Other sequelae of other cerebrovascular disease: Secondary | ICD-10-CM | POA: Diagnosis not present

## 2017-08-21 DIAGNOSIS — J810 Acute pulmonary edema: Secondary | ICD-10-CM | POA: Diagnosis not present

## 2017-08-21 DIAGNOSIS — J90 Pleural effusion, not elsewhere classified: Secondary | ICD-10-CM | POA: Diagnosis not present

## 2017-08-21 DIAGNOSIS — I700 Atherosclerosis of aorta: Secondary | ICD-10-CM | POA: Diagnosis not present

## 2017-08-21 DIAGNOSIS — R05 Cough: Secondary | ICD-10-CM | POA: Diagnosis not present

## 2017-08-21 DIAGNOSIS — Z7901 Long term (current) use of anticoagulants: Secondary | ICD-10-CM | POA: Diagnosis not present

## 2017-08-21 DIAGNOSIS — I6523 Occlusion and stenosis of bilateral carotid arteries: Secondary | ICD-10-CM | POA: Diagnosis not present

## 2017-08-21 DIAGNOSIS — E785 Hyperlipidemia, unspecified: Secondary | ICD-10-CM | POA: Diagnosis not present

## 2017-08-21 DIAGNOSIS — G459 Transient cerebral ischemic attack, unspecified: Secondary | ICD-10-CM | POA: Diagnosis not present

## 2017-08-21 DIAGNOSIS — K900 Celiac disease: Secondary | ICD-10-CM | POA: Diagnosis not present

## 2017-08-21 DIAGNOSIS — I509 Heart failure, unspecified: Secondary | ICD-10-CM | POA: Diagnosis not present

## 2017-08-21 DIAGNOSIS — Z23 Encounter for immunization: Secondary | ICD-10-CM | POA: Diagnosis not present

## 2017-08-21 DIAGNOSIS — I130 Hypertensive heart and chronic kidney disease with heart failure and stage 1 through stage 4 chronic kidney disease, or unspecified chronic kidney disease: Secondary | ICD-10-CM | POA: Diagnosis not present

## 2017-08-28 DIAGNOSIS — J449 Chronic obstructive pulmonary disease, unspecified: Secondary | ICD-10-CM | POA: Diagnosis not present

## 2017-08-28 DIAGNOSIS — I129 Hypertensive chronic kidney disease with stage 1 through stage 4 chronic kidney disease, or unspecified chronic kidney disease: Secondary | ICD-10-CM | POA: Diagnosis not present

## 2017-08-28 DIAGNOSIS — I700 Atherosclerosis of aorta: Secondary | ICD-10-CM | POA: Diagnosis not present

## 2017-08-28 DIAGNOSIS — J810 Acute pulmonary edema: Secondary | ICD-10-CM | POA: Diagnosis not present

## 2017-08-28 DIAGNOSIS — I2699 Other pulmonary embolism without acute cor pulmonale: Secondary | ICD-10-CM | POA: Diagnosis not present

## 2017-08-28 DIAGNOSIS — I130 Hypertensive heart and chronic kidney disease with heart failure and stage 1 through stage 4 chronic kidney disease, or unspecified chronic kidney disease: Secondary | ICD-10-CM | POA: Diagnosis not present

## 2017-08-28 DIAGNOSIS — I509 Heart failure, unspecified: Secondary | ICD-10-CM | POA: Diagnosis not present

## 2017-08-28 DIAGNOSIS — R279 Unspecified lack of coordination: Secondary | ICD-10-CM | POA: Diagnosis not present

## 2017-08-28 DIAGNOSIS — I5033 Acute on chronic diastolic (congestive) heart failure: Secondary | ICD-10-CM | POA: Diagnosis not present

## 2017-08-28 DIAGNOSIS — J189 Pneumonia, unspecified organism: Secondary | ICD-10-CM | POA: Diagnosis not present

## 2017-08-28 DIAGNOSIS — J690 Pneumonitis due to inhalation of food and vomit: Secondary | ICD-10-CM | POA: Diagnosis not present

## 2017-08-28 DIAGNOSIS — I69354 Hemiplegia and hemiparesis following cerebral infarction affecting left non-dominant side: Secondary | ICD-10-CM | POA: Diagnosis not present

## 2017-08-28 DIAGNOSIS — N183 Chronic kidney disease, stage 3 (moderate): Secondary | ICD-10-CM | POA: Diagnosis not present

## 2017-08-28 DIAGNOSIS — R0602 Shortness of breath: Secondary | ICD-10-CM | POA: Diagnosis not present

## 2017-08-28 DIAGNOSIS — K219 Gastro-esophageal reflux disease without esophagitis: Secondary | ICD-10-CM | POA: Diagnosis not present

## 2017-08-28 DIAGNOSIS — R69 Illness, unspecified: Secondary | ICD-10-CM | POA: Diagnosis not present

## 2017-08-28 DIAGNOSIS — M6281 Muscle weakness (generalized): Secondary | ICD-10-CM | POA: Diagnosis not present

## 2017-08-28 DIAGNOSIS — J9621 Acute and chronic respiratory failure with hypoxia: Secondary | ICD-10-CM | POA: Diagnosis not present

## 2017-08-28 DIAGNOSIS — K900 Celiac disease: Secondary | ICD-10-CM | POA: Diagnosis not present

## 2017-08-28 DIAGNOSIS — R2681 Unsteadiness on feet: Secondary | ICD-10-CM | POA: Diagnosis not present

## 2017-08-28 DIAGNOSIS — Z9181 History of falling: Secondary | ICD-10-CM | POA: Diagnosis not present

## 2017-08-28 DIAGNOSIS — I69898 Other sequelae of other cerebrovascular disease: Secondary | ICD-10-CM | POA: Diagnosis not present

## 2017-08-28 DIAGNOSIS — E785 Hyperlipidemia, unspecified: Secondary | ICD-10-CM | POA: Diagnosis not present

## 2017-08-28 DIAGNOSIS — Z7401 Bed confinement status: Secondary | ICD-10-CM | POA: Diagnosis not present

## 2017-08-29 DIAGNOSIS — I69354 Hemiplegia and hemiparesis following cerebral infarction affecting left non-dominant side: Secondary | ICD-10-CM | POA: Diagnosis not present

## 2017-08-29 DIAGNOSIS — K900 Celiac disease: Secondary | ICD-10-CM | POA: Diagnosis not present

## 2017-08-29 DIAGNOSIS — I5033 Acute on chronic diastolic (congestive) heart failure: Secondary | ICD-10-CM | POA: Diagnosis not present

## 2017-08-29 DIAGNOSIS — I2699 Other pulmonary embolism without acute cor pulmonale: Secondary | ICD-10-CM | POA: Diagnosis not present

## 2017-09-01 DIAGNOSIS — K900 Celiac disease: Secondary | ICD-10-CM | POA: Diagnosis not present

## 2017-09-01 DIAGNOSIS — R69 Illness, unspecified: Secondary | ICD-10-CM | POA: Diagnosis not present

## 2017-09-01 DIAGNOSIS — I509 Heart failure, unspecified: Secondary | ICD-10-CM | POA: Diagnosis not present

## 2017-09-01 DIAGNOSIS — J189 Pneumonia, unspecified organism: Secondary | ICD-10-CM | POA: Diagnosis not present

## 2017-09-08 DIAGNOSIS — J189 Pneumonia, unspecified organism: Secondary | ICD-10-CM | POA: Diagnosis not present

## 2017-09-08 DIAGNOSIS — I509 Heart failure, unspecified: Secondary | ICD-10-CM | POA: Diagnosis not present

## 2017-09-08 DIAGNOSIS — R69 Illness, unspecified: Secondary | ICD-10-CM | POA: Diagnosis not present

## 2017-09-14 DIAGNOSIS — I69354 Hemiplegia and hemiparesis following cerebral infarction affecting left non-dominant side: Secondary | ICD-10-CM | POA: Diagnosis not present

## 2017-09-14 DIAGNOSIS — R69 Illness, unspecified: Secondary | ICD-10-CM | POA: Diagnosis not present

## 2017-09-14 DIAGNOSIS — I509 Heart failure, unspecified: Secondary | ICD-10-CM | POA: Diagnosis not present

## 2017-09-14 DIAGNOSIS — K900 Celiac disease: Secondary | ICD-10-CM | POA: Diagnosis not present

## 2017-09-18 DIAGNOSIS — I129 Hypertensive chronic kidney disease with stage 1 through stage 4 chronic kidney disease, or unspecified chronic kidney disease: Secondary | ICD-10-CM | POA: Diagnosis not present

## 2017-09-18 DIAGNOSIS — J181 Lobar pneumonia, unspecified organism: Secondary | ICD-10-CM | POA: Diagnosis not present

## 2017-09-18 DIAGNOSIS — I5033 Acute on chronic diastolic (congestive) heart failure: Secondary | ICD-10-CM | POA: Diagnosis not present

## 2017-09-18 DIAGNOSIS — M6281 Muscle weakness (generalized): Secondary | ICD-10-CM | POA: Diagnosis not present

## 2017-09-18 DIAGNOSIS — R2681 Unsteadiness on feet: Secondary | ICD-10-CM | POA: Diagnosis not present

## 2017-09-18 DIAGNOSIS — Z9181 History of falling: Secondary | ICD-10-CM | POA: Diagnosis not present

## 2017-09-19 DIAGNOSIS — I110 Hypertensive heart disease with heart failure: Secondary | ICD-10-CM | POA: Diagnosis not present

## 2017-09-19 DIAGNOSIS — E785 Hyperlipidemia, unspecified: Secondary | ICD-10-CM | POA: Diagnosis not present

## 2017-09-19 DIAGNOSIS — I69354 Hemiplegia and hemiparesis following cerebral infarction affecting left non-dominant side: Secondary | ICD-10-CM | POA: Diagnosis not present

## 2017-09-19 DIAGNOSIS — I700 Atherosclerosis of aorta: Secondary | ICD-10-CM | POA: Diagnosis not present

## 2017-09-19 DIAGNOSIS — R69 Illness, unspecified: Secondary | ICD-10-CM | POA: Diagnosis not present

## 2017-09-19 DIAGNOSIS — J449 Chronic obstructive pulmonary disease, unspecified: Secondary | ICD-10-CM | POA: Diagnosis not present

## 2017-09-19 DIAGNOSIS — I5032 Chronic diastolic (congestive) heart failure: Secondary | ICD-10-CM | POA: Diagnosis not present

## 2017-09-19 DIAGNOSIS — I69318 Other symptoms and signs involving cognitive functions following cerebral infarction: Secondary | ICD-10-CM | POA: Diagnosis not present

## 2017-09-19 DIAGNOSIS — I358 Other nonrheumatic aortic valve disorders: Secondary | ICD-10-CM | POA: Diagnosis not present

## 2017-09-19 DIAGNOSIS — J9611 Chronic respiratory failure with hypoxia: Secondary | ICD-10-CM | POA: Diagnosis not present

## 2017-09-21 DIAGNOSIS — J449 Chronic obstructive pulmonary disease, unspecified: Secondary | ICD-10-CM | POA: Diagnosis not present

## 2017-09-21 DIAGNOSIS — R05 Cough: Secondary | ICD-10-CM | POA: Diagnosis not present

## 2017-09-21 DIAGNOSIS — R531 Weakness: Secondary | ICD-10-CM | POA: Diagnosis not present

## 2017-09-21 DIAGNOSIS — R0989 Other specified symptoms and signs involving the circulatory and respiratory systems: Secondary | ICD-10-CM | POA: Diagnosis not present

## 2017-09-21 DIAGNOSIS — R062 Wheezing: Secondary | ICD-10-CM | POA: Diagnosis not present

## 2017-09-22 DIAGNOSIS — F329 Major depressive disorder, single episode, unspecified: Secondary | ICD-10-CM | POA: Diagnosis not present

## 2017-09-22 DIAGNOSIS — F4322 Adjustment disorder with anxiety: Secondary | ICD-10-CM | POA: Diagnosis not present

## 2017-09-22 DIAGNOSIS — R69 Illness, unspecified: Secondary | ICD-10-CM | POA: Diagnosis not present

## 2017-09-23 DIAGNOSIS — R05 Cough: Secondary | ICD-10-CM | POA: Diagnosis not present

## 2017-09-23 DIAGNOSIS — R0989 Other specified symptoms and signs involving the circulatory and respiratory systems: Secondary | ICD-10-CM | POA: Diagnosis not present

## 2017-09-23 DIAGNOSIS — R21 Rash and other nonspecific skin eruption: Secondary | ICD-10-CM | POA: Diagnosis not present

## 2017-09-23 DIAGNOSIS — R54 Age-related physical debility: Secondary | ICD-10-CM | POA: Diagnosis not present

## 2017-10-11 DIAGNOSIS — R269 Unspecified abnormalities of gait and mobility: Secondary | ICD-10-CM | POA: Diagnosis not present

## 2017-10-11 DIAGNOSIS — M6281 Muscle weakness (generalized): Secondary | ICD-10-CM | POA: Diagnosis not present

## 2017-10-11 DIAGNOSIS — R54 Age-related physical debility: Secondary | ICD-10-CM | POA: Diagnosis not present

## 2017-10-11 DIAGNOSIS — W050XXA Fall from non-moving wheelchair, initial encounter: Secondary | ICD-10-CM | POA: Diagnosis not present

## 2017-10-12 DIAGNOSIS — M6281 Muscle weakness (generalized): Secondary | ICD-10-CM | POA: Diagnosis not present

## 2017-10-12 DIAGNOSIS — I129 Hypertensive chronic kidney disease with stage 1 through stage 4 chronic kidney disease, or unspecified chronic kidney disease: Secondary | ICD-10-CM | POA: Diagnosis not present

## 2017-10-12 DIAGNOSIS — R2681 Unsteadiness on feet: Secondary | ICD-10-CM | POA: Diagnosis not present

## 2017-10-12 DIAGNOSIS — Z9181 History of falling: Secondary | ICD-10-CM | POA: Diagnosis not present

## 2017-10-14 DIAGNOSIS — I10 Essential (primary) hypertension: Secondary | ICD-10-CM | POA: Diagnosis not present

## 2017-10-14 DIAGNOSIS — I5032 Chronic diastolic (congestive) heart failure: Secondary | ICD-10-CM | POA: Diagnosis not present

## 2017-10-14 DIAGNOSIS — I358 Other nonrheumatic aortic valve disorders: Secondary | ICD-10-CM | POA: Diagnosis not present

## 2017-10-14 DIAGNOSIS — J9611 Chronic respiratory failure with hypoxia: Secondary | ICD-10-CM | POA: Diagnosis not present

## 2017-10-18 DIAGNOSIS — Z9181 History of falling: Secondary | ICD-10-CM | POA: Diagnosis not present

## 2017-10-18 DIAGNOSIS — J181 Lobar pneumonia, unspecified organism: Secondary | ICD-10-CM | POA: Diagnosis not present

## 2017-10-18 DIAGNOSIS — M6281 Muscle weakness (generalized): Secondary | ICD-10-CM | POA: Diagnosis not present

## 2017-10-18 DIAGNOSIS — I5033 Acute on chronic diastolic (congestive) heart failure: Secondary | ICD-10-CM | POA: Diagnosis not present

## 2017-10-18 DIAGNOSIS — I129 Hypertensive chronic kidney disease with stage 1 through stage 4 chronic kidney disease, or unspecified chronic kidney disease: Secondary | ICD-10-CM | POA: Diagnosis not present

## 2017-10-18 DIAGNOSIS — R2681 Unsteadiness on feet: Secondary | ICD-10-CM | POA: Diagnosis not present

## 2017-10-19 DIAGNOSIS — F329 Major depressive disorder, single episode, unspecified: Secondary | ICD-10-CM | POA: Diagnosis not present

## 2017-10-19 DIAGNOSIS — R69 Illness, unspecified: Secondary | ICD-10-CM | POA: Diagnosis not present

## 2017-10-19 DIAGNOSIS — F4322 Adjustment disorder with anxiety: Secondary | ICD-10-CM | POA: Diagnosis not present

## 2017-10-20 DIAGNOSIS — R69 Illness, unspecified: Secondary | ICD-10-CM | POA: Diagnosis not present

## 2017-10-25 DIAGNOSIS — I5033 Acute on chronic diastolic (congestive) heart failure: Secondary | ICD-10-CM | POA: Diagnosis not present

## 2017-10-25 DIAGNOSIS — R54 Age-related physical debility: Secondary | ICD-10-CM | POA: Diagnosis not present

## 2017-10-25 DIAGNOSIS — R600 Localized edema: Secondary | ICD-10-CM | POA: Diagnosis not present

## 2017-10-26 DIAGNOSIS — R600 Localized edema: Secondary | ICD-10-CM | POA: Diagnosis not present

## 2017-10-26 DIAGNOSIS — M79605 Pain in left leg: Secondary | ICD-10-CM | POA: Diagnosis not present

## 2017-11-08 DIAGNOSIS — R54 Age-related physical debility: Secondary | ICD-10-CM | POA: Diagnosis not present

## 2017-11-08 DIAGNOSIS — D649 Anemia, unspecified: Secondary | ICD-10-CM | POA: Diagnosis not present

## 2017-11-08 DIAGNOSIS — I5033 Acute on chronic diastolic (congestive) heart failure: Secondary | ICD-10-CM | POA: Diagnosis not present

## 2017-11-08 DIAGNOSIS — R600 Localized edema: Secondary | ICD-10-CM | POA: Diagnosis not present

## 2017-11-12 DIAGNOSIS — Z9181 History of falling: Secondary | ICD-10-CM | POA: Diagnosis not present

## 2017-11-12 DIAGNOSIS — I129 Hypertensive chronic kidney disease with stage 1 through stage 4 chronic kidney disease, or unspecified chronic kidney disease: Secondary | ICD-10-CM | POA: Diagnosis not present

## 2017-11-12 DIAGNOSIS — R2681 Unsteadiness on feet: Secondary | ICD-10-CM | POA: Diagnosis not present

## 2017-11-12 DIAGNOSIS — M6281 Muscle weakness (generalized): Secondary | ICD-10-CM | POA: Diagnosis not present

## 2017-11-16 DIAGNOSIS — I509 Heart failure, unspecified: Secondary | ICD-10-CM | POA: Diagnosis not present

## 2017-11-16 DIAGNOSIS — R600 Localized edema: Secondary | ICD-10-CM | POA: Diagnosis not present

## 2017-11-18 DIAGNOSIS — R2681 Unsteadiness on feet: Secondary | ICD-10-CM | POA: Diagnosis not present

## 2017-11-18 DIAGNOSIS — I5033 Acute on chronic diastolic (congestive) heart failure: Secondary | ICD-10-CM | POA: Diagnosis not present

## 2017-11-18 DIAGNOSIS — Z9181 History of falling: Secondary | ICD-10-CM | POA: Diagnosis not present

## 2017-11-18 DIAGNOSIS — J181 Lobar pneumonia, unspecified organism: Secondary | ICD-10-CM | POA: Diagnosis not present

## 2017-11-18 DIAGNOSIS — M6281 Muscle weakness (generalized): Secondary | ICD-10-CM | POA: Diagnosis not present

## 2017-11-18 DIAGNOSIS — I129 Hypertensive chronic kidney disease with stage 1 through stage 4 chronic kidney disease, or unspecified chronic kidney disease: Secondary | ICD-10-CM | POA: Diagnosis not present

## 2017-11-22 DIAGNOSIS — I10 Essential (primary) hypertension: Secondary | ICD-10-CM | POA: Diagnosis not present

## 2017-11-23 DIAGNOSIS — F329 Major depressive disorder, single episode, unspecified: Secondary | ICD-10-CM | POA: Diagnosis not present

## 2017-11-23 DIAGNOSIS — B373 Candidiasis of vulva and vagina: Secondary | ICD-10-CM | POA: Diagnosis not present

## 2017-11-23 DIAGNOSIS — N898 Other specified noninflammatory disorders of vagina: Secondary | ICD-10-CM | POA: Diagnosis not present

## 2017-11-23 DIAGNOSIS — R69 Illness, unspecified: Secondary | ICD-10-CM | POA: Diagnosis not present

## 2017-11-23 DIAGNOSIS — F4322 Adjustment disorder with anxiety: Secondary | ICD-10-CM | POA: Diagnosis not present

## 2017-11-24 DIAGNOSIS — N390 Urinary tract infection, site not specified: Secondary | ICD-10-CM | POA: Diagnosis not present

## 2017-11-25 DIAGNOSIS — R54 Age-related physical debility: Secondary | ICD-10-CM | POA: Diagnosis not present

## 2017-11-25 DIAGNOSIS — L299 Pruritus, unspecified: Secondary | ICD-10-CM | POA: Diagnosis not present

## 2017-11-25 DIAGNOSIS — R21 Rash and other nonspecific skin eruption: Secondary | ICD-10-CM | POA: Diagnosis not present

## 2017-11-28 DIAGNOSIS — M6281 Muscle weakness (generalized): Secondary | ICD-10-CM | POA: Diagnosis not present

## 2017-11-28 DIAGNOSIS — I129 Hypertensive chronic kidney disease with stage 1 through stage 4 chronic kidney disease, or unspecified chronic kidney disease: Secondary | ICD-10-CM | POA: Diagnosis not present

## 2017-11-28 DIAGNOSIS — I5033 Acute on chronic diastolic (congestive) heart failure: Secondary | ICD-10-CM | POA: Diagnosis not present

## 2017-11-28 DIAGNOSIS — J181 Lobar pneumonia, unspecified organism: Secondary | ICD-10-CM | POA: Diagnosis not present

## 2017-11-28 DIAGNOSIS — R2681 Unsteadiness on feet: Secondary | ICD-10-CM | POA: Diagnosis not present

## 2017-11-28 DIAGNOSIS — Z9181 History of falling: Secondary | ICD-10-CM | POA: Diagnosis not present

## 2017-12-08 DIAGNOSIS — D649 Anemia, unspecified: Secondary | ICD-10-CM | POA: Diagnosis not present

## 2017-12-12 DIAGNOSIS — Z9181 History of falling: Secondary | ICD-10-CM | POA: Diagnosis not present

## 2017-12-12 DIAGNOSIS — M6281 Muscle weakness (generalized): Secondary | ICD-10-CM | POA: Diagnosis not present

## 2017-12-12 DIAGNOSIS — L304 Erythema intertrigo: Secondary | ICD-10-CM | POA: Diagnosis not present

## 2017-12-12 DIAGNOSIS — R54 Age-related physical debility: Secondary | ICD-10-CM | POA: Diagnosis not present

## 2017-12-12 DIAGNOSIS — I129 Hypertensive chronic kidney disease with stage 1 through stage 4 chronic kidney disease, or unspecified chronic kidney disease: Secondary | ICD-10-CM | POA: Diagnosis not present

## 2017-12-12 DIAGNOSIS — L89329 Pressure ulcer of left buttock, unspecified stage: Secondary | ICD-10-CM | POA: Diagnosis not present

## 2017-12-12 DIAGNOSIS — R2681 Unsteadiness on feet: Secondary | ICD-10-CM | POA: Diagnosis not present

## 2017-12-18 DIAGNOSIS — R410 Disorientation, unspecified: Secondary | ICD-10-CM | POA: Diagnosis not present

## 2017-12-18 DIAGNOSIS — R2681 Unsteadiness on feet: Secondary | ICD-10-CM | POA: Diagnosis not present

## 2017-12-18 DIAGNOSIS — I5033 Acute on chronic diastolic (congestive) heart failure: Secondary | ICD-10-CM | POA: Diagnosis not present

## 2017-12-18 DIAGNOSIS — R0902 Hypoxemia: Secondary | ICD-10-CM | POA: Diagnosis not present

## 2017-12-18 DIAGNOSIS — J181 Lobar pneumonia, unspecified organism: Secondary | ICD-10-CM | POA: Diagnosis not present

## 2017-12-18 DIAGNOSIS — M6281 Muscle weakness (generalized): Secondary | ICD-10-CM | POA: Diagnosis not present

## 2017-12-18 DIAGNOSIS — Z9181 History of falling: Secondary | ICD-10-CM | POA: Diagnosis not present

## 2017-12-18 DIAGNOSIS — R05 Cough: Secondary | ICD-10-CM | POA: Diagnosis not present

## 2017-12-18 DIAGNOSIS — I129 Hypertensive chronic kidney disease with stage 1 through stage 4 chronic kidney disease, or unspecified chronic kidney disease: Secondary | ICD-10-CM | POA: Diagnosis not present

## 2017-12-18 DIAGNOSIS — E860 Dehydration: Secondary | ICD-10-CM | POA: Diagnosis not present

## 2017-12-18 DIAGNOSIS — R0600 Dyspnea, unspecified: Secondary | ICD-10-CM | POA: Diagnosis not present

## 2017-12-18 DIAGNOSIS — N179 Acute kidney failure, unspecified: Secondary | ICD-10-CM | POA: Diagnosis not present

## 2017-12-18 DIAGNOSIS — J189 Pneumonia, unspecified organism: Secondary | ICD-10-CM | POA: Diagnosis not present

## 2017-12-18 DIAGNOSIS — R4020 Unspecified coma: Secondary | ICD-10-CM | POA: Diagnosis not present

## 2017-12-18 DIAGNOSIS — R0602 Shortness of breath: Secondary | ICD-10-CM | POA: Diagnosis not present

## 2017-12-19 DIAGNOSIS — J181 Lobar pneumonia, unspecified organism: Secondary | ICD-10-CM | POA: Diagnosis not present

## 2017-12-19 DIAGNOSIS — N179 Acute kidney failure, unspecified: Secondary | ICD-10-CM | POA: Diagnosis not present

## 2017-12-19 DIAGNOSIS — R739 Hyperglycemia, unspecified: Secondary | ICD-10-CM | POA: Diagnosis not present

## 2017-12-19 DIAGNOSIS — J9691 Respiratory failure, unspecified with hypoxia: Secondary | ICD-10-CM | POA: Diagnosis not present

## 2017-12-19 DIAGNOSIS — J440 Chronic obstructive pulmonary disease with acute lower respiratory infection: Secondary | ICD-10-CM | POA: Diagnosis not present

## 2017-12-19 DIAGNOSIS — A419 Sepsis, unspecified organism: Secondary | ICD-10-CM | POA: Diagnosis not present

## 2017-12-19 DIAGNOSIS — E876 Hypokalemia: Secondary | ICD-10-CM | POA: Diagnosis not present

## 2017-12-19 DIAGNOSIS — R918 Other nonspecific abnormal finding of lung field: Secondary | ICD-10-CM | POA: Diagnosis not present

## 2017-12-19 DIAGNOSIS — I129 Hypertensive chronic kidney disease with stage 1 through stage 4 chronic kidney disease, or unspecified chronic kidney disease: Secondary | ICD-10-CM | POA: Diagnosis not present

## 2017-12-19 DIAGNOSIS — R05 Cough: Secondary | ICD-10-CM | POA: Diagnosis not present

## 2017-12-19 DIAGNOSIS — J209 Acute bronchitis, unspecified: Secondary | ICD-10-CM | POA: Diagnosis not present

## 2017-12-19 DIAGNOSIS — J9690 Respiratory failure, unspecified, unspecified whether with hypoxia or hypercapnia: Secondary | ICD-10-CM | POA: Diagnosis not present

## 2017-12-19 DIAGNOSIS — Z9181 History of falling: Secondary | ICD-10-CM | POA: Diagnosis not present

## 2017-12-19 DIAGNOSIS — E860 Dehydration: Secondary | ICD-10-CM | POA: Diagnosis not present

## 2017-12-19 DIAGNOSIS — M6281 Muscle weakness (generalized): Secondary | ICD-10-CM | POA: Diagnosis not present

## 2017-12-19 DIAGNOSIS — N289 Disorder of kidney and ureter, unspecified: Secondary | ICD-10-CM | POA: Diagnosis not present

## 2017-12-19 DIAGNOSIS — J189 Pneumonia, unspecified organism: Secondary | ICD-10-CM | POA: Diagnosis not present

## 2017-12-19 DIAGNOSIS — J690 Pneumonitis due to inhalation of food and vomit: Secondary | ICD-10-CM | POA: Diagnosis not present

## 2017-12-19 DIAGNOSIS — T360X5A Adverse effect of penicillins, initial encounter: Secondary | ICD-10-CM | POA: Diagnosis not present

## 2017-12-19 DIAGNOSIS — J9811 Atelectasis: Secondary | ICD-10-CM | POA: Diagnosis not present

## 2017-12-19 DIAGNOSIS — L271 Localized skin eruption due to drugs and medicaments taken internally: Secondary | ICD-10-CM | POA: Diagnosis not present

## 2017-12-19 DIAGNOSIS — I5032 Chronic diastolic (congestive) heart failure: Secondary | ICD-10-CM | POA: Diagnosis not present

## 2017-12-19 DIAGNOSIS — I5033 Acute on chronic diastolic (congestive) heart failure: Secondary | ICD-10-CM | POA: Diagnosis not present

## 2017-12-19 DIAGNOSIS — R2681 Unsteadiness on feet: Secondary | ICD-10-CM | POA: Diagnosis not present

## 2017-12-19 DIAGNOSIS — J9611 Chronic respiratory failure with hypoxia: Secondary | ICD-10-CM | POA: Diagnosis not present

## 2017-12-19 DIAGNOSIS — R0602 Shortness of breath: Secondary | ICD-10-CM | POA: Diagnosis not present

## 2017-12-19 DIAGNOSIS — K760 Fatty (change of) liver, not elsewhere classified: Secondary | ICD-10-CM | POA: Diagnosis not present

## 2017-12-30 DIAGNOSIS — M6281 Muscle weakness (generalized): Secondary | ICD-10-CM | POA: Diagnosis not present

## 2017-12-30 DIAGNOSIS — I480 Paroxysmal atrial fibrillation: Secondary | ICD-10-CM | POA: Diagnosis not present

## 2017-12-30 DIAGNOSIS — E876 Hypokalemia: Secondary | ICD-10-CM | POA: Diagnosis not present

## 2017-12-30 DIAGNOSIS — N179 Acute kidney failure, unspecified: Secondary | ICD-10-CM | POA: Diagnosis not present

## 2017-12-30 DIAGNOSIS — R69 Illness, unspecified: Secondary | ICD-10-CM | POA: Diagnosis not present

## 2018-01-04 DIAGNOSIS — F419 Anxiety disorder, unspecified: Secondary | ICD-10-CM | POA: Diagnosis not present

## 2018-01-04 DIAGNOSIS — R69 Illness, unspecified: Secondary | ICD-10-CM | POA: Diagnosis not present

## 2018-01-04 DIAGNOSIS — F329 Major depressive disorder, single episode, unspecified: Secondary | ICD-10-CM | POA: Diagnosis not present

## 2018-01-05 DIAGNOSIS — R0989 Other specified symptoms and signs involving the circulatory and respiratory systems: Secondary | ICD-10-CM | POA: Diagnosis not present

## 2018-01-05 DIAGNOSIS — J449 Chronic obstructive pulmonary disease, unspecified: Secondary | ICD-10-CM | POA: Diagnosis not present

## 2018-01-05 DIAGNOSIS — I517 Cardiomegaly: Secondary | ICD-10-CM | POA: Diagnosis not present

## 2018-01-05 DIAGNOSIS — J189 Pneumonia, unspecified organism: Secondary | ICD-10-CM | POA: Diagnosis not present

## 2018-01-05 DIAGNOSIS — D649 Anemia, unspecified: Secondary | ICD-10-CM | POA: Diagnosis not present

## 2018-01-05 DIAGNOSIS — I10 Essential (primary) hypertension: Secondary | ICD-10-CM | POA: Diagnosis not present

## 2018-01-05 DIAGNOSIS — I429 Cardiomyopathy, unspecified: Secondary | ICD-10-CM | POA: Diagnosis not present

## 2018-01-05 DIAGNOSIS — M1990 Unspecified osteoarthritis, unspecified site: Secondary | ICD-10-CM | POA: Diagnosis not present

## 2018-01-05 DIAGNOSIS — E119 Type 2 diabetes mellitus without complications: Secondary | ICD-10-CM | POA: Diagnosis not present

## 2018-01-12 DIAGNOSIS — Z9181 History of falling: Secondary | ICD-10-CM | POA: Diagnosis not present

## 2018-01-12 DIAGNOSIS — I129 Hypertensive chronic kidney disease with stage 1 through stage 4 chronic kidney disease, or unspecified chronic kidney disease: Secondary | ICD-10-CM | POA: Diagnosis not present

## 2018-01-12 DIAGNOSIS — R2681 Unsteadiness on feet: Secondary | ICD-10-CM | POA: Diagnosis not present

## 2018-01-12 DIAGNOSIS — M6281 Muscle weakness (generalized): Secondary | ICD-10-CM | POA: Diagnosis not present

## 2018-01-18 DIAGNOSIS — I5033 Acute on chronic diastolic (congestive) heart failure: Secondary | ICD-10-CM | POA: Diagnosis not present

## 2018-01-18 DIAGNOSIS — R600 Localized edema: Secondary | ICD-10-CM | POA: Diagnosis not present

## 2018-01-18 DIAGNOSIS — Z9181 History of falling: Secondary | ICD-10-CM | POA: Diagnosis not present

## 2018-01-18 DIAGNOSIS — R05 Cough: Secondary | ICD-10-CM | POA: Diagnosis not present

## 2018-01-18 DIAGNOSIS — E8770 Fluid overload, unspecified: Secondary | ICD-10-CM | POA: Diagnosis not present

## 2018-01-18 DIAGNOSIS — M6281 Muscle weakness (generalized): Secondary | ICD-10-CM | POA: Diagnosis not present

## 2018-01-18 DIAGNOSIS — I129 Hypertensive chronic kidney disease with stage 1 through stage 4 chronic kidney disease, or unspecified chronic kidney disease: Secondary | ICD-10-CM | POA: Diagnosis not present

## 2018-01-18 DIAGNOSIS — R2681 Unsteadiness on feet: Secondary | ICD-10-CM | POA: Diagnosis not present

## 2018-01-18 DIAGNOSIS — J181 Lobar pneumonia, unspecified organism: Secondary | ICD-10-CM | POA: Diagnosis not present

## 2018-01-18 DIAGNOSIS — R0602 Shortness of breath: Secondary | ICD-10-CM | POA: Diagnosis not present

## 2018-01-19 DIAGNOSIS — I5033 Acute on chronic diastolic (congestive) heart failure: Secondary | ICD-10-CM | POA: Diagnosis not present

## 2018-01-19 DIAGNOSIS — I4891 Unspecified atrial fibrillation: Secondary | ICD-10-CM | POA: Diagnosis not present

## 2018-01-19 DIAGNOSIS — I10 Essential (primary) hypertension: Secondary | ICD-10-CM | POA: Diagnosis not present

## 2018-01-19 DIAGNOSIS — N179 Acute kidney failure, unspecified: Secondary | ICD-10-CM | POA: Diagnosis not present

## 2018-01-26 DIAGNOSIS — F329 Major depressive disorder, single episode, unspecified: Secondary | ICD-10-CM | POA: Diagnosis not present

## 2018-01-26 DIAGNOSIS — R69 Illness, unspecified: Secondary | ICD-10-CM | POA: Diagnosis not present

## 2018-01-26 DIAGNOSIS — F4322 Adjustment disorder with anxiety: Secondary | ICD-10-CM | POA: Diagnosis not present

## 2018-01-29 DIAGNOSIS — J181 Lobar pneumonia, unspecified organism: Secondary | ICD-10-CM | POA: Diagnosis not present

## 2018-01-29 DIAGNOSIS — I5033 Acute on chronic diastolic (congestive) heart failure: Secondary | ICD-10-CM | POA: Diagnosis not present

## 2018-01-29 DIAGNOSIS — I129 Hypertensive chronic kidney disease with stage 1 through stage 4 chronic kidney disease, or unspecified chronic kidney disease: Secondary | ICD-10-CM | POA: Diagnosis not present

## 2018-01-29 DIAGNOSIS — R2681 Unsteadiness on feet: Secondary | ICD-10-CM | POA: Diagnosis not present

## 2018-01-29 DIAGNOSIS — Z9181 History of falling: Secondary | ICD-10-CM | POA: Diagnosis not present

## 2018-01-29 DIAGNOSIS — M6281 Muscle weakness (generalized): Secondary | ICD-10-CM | POA: Diagnosis not present

## 2018-02-06 DIAGNOSIS — Z23 Encounter for immunization: Secondary | ICD-10-CM | POA: Diagnosis not present

## 2018-02-12 DIAGNOSIS — R2681 Unsteadiness on feet: Secondary | ICD-10-CM | POA: Diagnosis not present

## 2018-02-12 DIAGNOSIS — I129 Hypertensive chronic kidney disease with stage 1 through stage 4 chronic kidney disease, or unspecified chronic kidney disease: Secondary | ICD-10-CM | POA: Diagnosis not present

## 2018-02-12 DIAGNOSIS — M6281 Muscle weakness (generalized): Secondary | ICD-10-CM | POA: Diagnosis not present

## 2018-02-12 DIAGNOSIS — Z9181 History of falling: Secondary | ICD-10-CM | POA: Diagnosis not present

## 2018-02-13 DIAGNOSIS — R54 Age-related physical debility: Secondary | ICD-10-CM | POA: Diagnosis not present

## 2018-02-13 DIAGNOSIS — L22 Diaper dermatitis: Secondary | ICD-10-CM | POA: Diagnosis not present

## 2018-02-13 DIAGNOSIS — R197 Diarrhea, unspecified: Secondary | ICD-10-CM | POA: Diagnosis not present

## 2018-02-14 DIAGNOSIS — I5033 Acute on chronic diastolic (congestive) heart failure: Secondary | ICD-10-CM | POA: Diagnosis not present

## 2018-02-14 DIAGNOSIS — B356 Tinea cruris: Secondary | ICD-10-CM | POA: Diagnosis not present

## 2018-02-14 DIAGNOSIS — R600 Localized edema: Secondary | ICD-10-CM | POA: Diagnosis not present

## 2018-02-14 DIAGNOSIS — R635 Abnormal weight gain: Secondary | ICD-10-CM | POA: Diagnosis not present

## 2018-02-18 DIAGNOSIS — Z9181 History of falling: Secondary | ICD-10-CM | POA: Diagnosis not present

## 2018-02-18 DIAGNOSIS — M6281 Muscle weakness (generalized): Secondary | ICD-10-CM | POA: Diagnosis not present

## 2018-02-18 DIAGNOSIS — J181 Lobar pneumonia, unspecified organism: Secondary | ICD-10-CM | POA: Diagnosis not present

## 2018-02-18 DIAGNOSIS — I5033 Acute on chronic diastolic (congestive) heart failure: Secondary | ICD-10-CM | POA: Diagnosis not present

## 2018-02-18 DIAGNOSIS — I129 Hypertensive chronic kidney disease with stage 1 through stage 4 chronic kidney disease, or unspecified chronic kidney disease: Secondary | ICD-10-CM | POA: Diagnosis not present

## 2018-02-18 DIAGNOSIS — R2681 Unsteadiness on feet: Secondary | ICD-10-CM | POA: Diagnosis not present

## 2018-02-22 DIAGNOSIS — F329 Major depressive disorder, single episode, unspecified: Secondary | ICD-10-CM | POA: Diagnosis not present

## 2018-02-22 DIAGNOSIS — R69 Illness, unspecified: Secondary | ICD-10-CM | POA: Diagnosis not present

## 2018-03-08 DIAGNOSIS — R69 Illness, unspecified: Secondary | ICD-10-CM | POA: Diagnosis not present

## 2018-03-11 DIAGNOSIS — A0472 Enterocolitis due to Clostridium difficile, not specified as recurrent: Secondary | ICD-10-CM | POA: Diagnosis not present

## 2018-03-13 DIAGNOSIS — L304 Erythema intertrigo: Secondary | ICD-10-CM | POA: Diagnosis not present

## 2018-03-13 DIAGNOSIS — R54 Age-related physical debility: Secondary | ICD-10-CM | POA: Diagnosis not present

## 2018-03-14 DIAGNOSIS — I129 Hypertensive chronic kidney disease with stage 1 through stage 4 chronic kidney disease, or unspecified chronic kidney disease: Secondary | ICD-10-CM | POA: Diagnosis not present

## 2018-03-14 DIAGNOSIS — R2681 Unsteadiness on feet: Secondary | ICD-10-CM | POA: Diagnosis not present

## 2018-03-14 DIAGNOSIS — M6281 Muscle weakness (generalized): Secondary | ICD-10-CM | POA: Diagnosis not present

## 2018-03-14 DIAGNOSIS — Z9181 History of falling: Secondary | ICD-10-CM | POA: Diagnosis not present

## 2018-03-18 DIAGNOSIS — I2699 Other pulmonary embolism without acute cor pulmonale: Secondary | ICD-10-CM | POA: Diagnosis not present

## 2018-03-18 DIAGNOSIS — I5033 Acute on chronic diastolic (congestive) heart failure: Secondary | ICD-10-CM | POA: Diagnosis not present

## 2018-03-18 DIAGNOSIS — J449 Chronic obstructive pulmonary disease, unspecified: Secondary | ICD-10-CM | POA: Diagnosis not present

## 2018-03-18 DIAGNOSIS — I69354 Hemiplegia and hemiparesis following cerebral infarction affecting left non-dominant side: Secondary | ICD-10-CM | POA: Diagnosis not present

## 2018-03-20 DIAGNOSIS — R2681 Unsteadiness on feet: Secondary | ICD-10-CM | POA: Diagnosis not present

## 2018-03-20 DIAGNOSIS — J181 Lobar pneumonia, unspecified organism: Secondary | ICD-10-CM | POA: Diagnosis not present

## 2018-03-20 DIAGNOSIS — I129 Hypertensive chronic kidney disease with stage 1 through stage 4 chronic kidney disease, or unspecified chronic kidney disease: Secondary | ICD-10-CM | POA: Diagnosis not present

## 2018-03-20 DIAGNOSIS — M6281 Muscle weakness (generalized): Secondary | ICD-10-CM | POA: Diagnosis not present

## 2018-03-20 DIAGNOSIS — Z9181 History of falling: Secondary | ICD-10-CM | POA: Diagnosis not present

## 2018-03-20 DIAGNOSIS — I5033 Acute on chronic diastolic (congestive) heart failure: Secondary | ICD-10-CM | POA: Diagnosis not present

## 2018-03-21 DIAGNOSIS — G309 Alzheimer's disease, unspecified: Secondary | ICD-10-CM | POA: Diagnosis not present

## 2018-03-21 DIAGNOSIS — I5033 Acute on chronic diastolic (congestive) heart failure: Secondary | ICD-10-CM | POA: Diagnosis not present

## 2018-03-21 DIAGNOSIS — R69 Illness, unspecified: Secondary | ICD-10-CM | POA: Diagnosis not present

## 2018-03-21 DIAGNOSIS — I480 Paroxysmal atrial fibrillation: Secondary | ICD-10-CM | POA: Diagnosis not present

## 2018-03-21 DIAGNOSIS — I129 Hypertensive chronic kidney disease with stage 1 through stage 4 chronic kidney disease, or unspecified chronic kidney disease: Secondary | ICD-10-CM | POA: Diagnosis not present

## 2018-03-22 DIAGNOSIS — R69 Illness, unspecified: Secondary | ICD-10-CM | POA: Diagnosis not present

## 2018-03-22 DIAGNOSIS — F329 Major depressive disorder, single episode, unspecified: Secondary | ICD-10-CM | POA: Diagnosis not present

## 2018-03-27 DIAGNOSIS — R2689 Other abnormalities of gait and mobility: Secondary | ICD-10-CM | POA: Diagnosis not present

## 2018-03-27 DIAGNOSIS — M79671 Pain in right foot: Secondary | ICD-10-CM | POA: Diagnosis not present

## 2018-03-27 DIAGNOSIS — L304 Erythema intertrigo: Secondary | ICD-10-CM | POA: Diagnosis not present

## 2018-03-27 DIAGNOSIS — R54 Age-related physical debility: Secondary | ICD-10-CM | POA: Diagnosis not present

## 2018-03-29 DIAGNOSIS — R54 Age-related physical debility: Secondary | ICD-10-CM | POA: Diagnosis not present

## 2018-03-29 DIAGNOSIS — J449 Chronic obstructive pulmonary disease, unspecified: Secondary | ICD-10-CM | POA: Diagnosis not present

## 2018-03-29 DIAGNOSIS — R69 Illness, unspecified: Secondary | ICD-10-CM | POA: Diagnosis not present

## 2018-04-01 DIAGNOSIS — E039 Hypothyroidism, unspecified: Secondary | ICD-10-CM | POA: Diagnosis not present

## 2018-04-01 DIAGNOSIS — E785 Hyperlipidemia, unspecified: Secondary | ICD-10-CM | POA: Diagnosis not present

## 2018-04-01 DIAGNOSIS — E119 Type 2 diabetes mellitus without complications: Secondary | ICD-10-CM | POA: Diagnosis not present

## 2018-04-01 DIAGNOSIS — Z79899 Other long term (current) drug therapy: Secondary | ICD-10-CM | POA: Diagnosis not present

## 2018-04-01 DIAGNOSIS — D649 Anemia, unspecified: Secondary | ICD-10-CM | POA: Diagnosis not present

## 2018-04-01 DIAGNOSIS — E559 Vitamin D deficiency, unspecified: Secondary | ICD-10-CM | POA: Diagnosis not present

## 2018-04-03 DIAGNOSIS — E559 Vitamin D deficiency, unspecified: Secondary | ICD-10-CM | POA: Diagnosis not present

## 2018-04-03 DIAGNOSIS — J449 Chronic obstructive pulmonary disease, unspecified: Secondary | ICD-10-CM | POA: Diagnosis not present

## 2018-04-03 DIAGNOSIS — I509 Heart failure, unspecified: Secondary | ICD-10-CM | POA: Diagnosis not present

## 2018-04-03 DIAGNOSIS — R69 Illness, unspecified: Secondary | ICD-10-CM | POA: Diagnosis not present

## 2018-04-03 DIAGNOSIS — R062 Wheezing: Secondary | ICD-10-CM | POA: Diagnosis not present

## 2018-04-03 DIAGNOSIS — I10 Essential (primary) hypertension: Secondary | ICD-10-CM | POA: Diagnosis not present

## 2018-04-06 DIAGNOSIS — D649 Anemia, unspecified: Secondary | ICD-10-CM | POA: Diagnosis not present

## 2018-04-06 DIAGNOSIS — Z79899 Other long term (current) drug therapy: Secondary | ICD-10-CM | POA: Diagnosis not present

## 2018-04-08 DIAGNOSIS — I5033 Acute on chronic diastolic (congestive) heart failure: Secondary | ICD-10-CM | POA: Diagnosis not present

## 2018-04-08 DIAGNOSIS — K900 Celiac disease: Secondary | ICD-10-CM | POA: Diagnosis not present

## 2018-04-08 DIAGNOSIS — I69354 Hemiplegia and hemiparesis following cerebral infarction affecting left non-dominant side: Secondary | ICD-10-CM | POA: Diagnosis not present

## 2018-04-08 DIAGNOSIS — I2699 Other pulmonary embolism without acute cor pulmonale: Secondary | ICD-10-CM | POA: Diagnosis not present

## 2018-04-10 DIAGNOSIS — Z79899 Other long term (current) drug therapy: Secondary | ICD-10-CM | POA: Diagnosis not present

## 2018-04-10 DIAGNOSIS — I509 Heart failure, unspecified: Secondary | ICD-10-CM | POA: Diagnosis not present

## 2018-04-10 DIAGNOSIS — R69 Illness, unspecified: Secondary | ICD-10-CM | POA: Diagnosis not present

## 2018-04-10 DIAGNOSIS — D649 Anemia, unspecified: Secondary | ICD-10-CM | POA: Diagnosis not present

## 2018-04-10 DIAGNOSIS — E559 Vitamin D deficiency, unspecified: Secondary | ICD-10-CM | POA: Diagnosis not present

## 2018-04-10 DIAGNOSIS — J189 Pneumonia, unspecified organism: Secondary | ICD-10-CM | POA: Diagnosis not present

## 2018-04-14 DIAGNOSIS — D649 Anemia, unspecified: Secondary | ICD-10-CM | POA: Diagnosis not present

## 2018-04-14 DIAGNOSIS — Z79899 Other long term (current) drug therapy: Secondary | ICD-10-CM | POA: Diagnosis not present

## 2018-04-14 DIAGNOSIS — Z9181 History of falling: Secondary | ICD-10-CM | POA: Diagnosis not present

## 2018-04-14 DIAGNOSIS — I129 Hypertensive chronic kidney disease with stage 1 through stage 4 chronic kidney disease, or unspecified chronic kidney disease: Secondary | ICD-10-CM | POA: Diagnosis not present

## 2018-04-14 DIAGNOSIS — R2681 Unsteadiness on feet: Secondary | ICD-10-CM | POA: Diagnosis not present

## 2018-04-14 DIAGNOSIS — M6281 Muscle weakness (generalized): Secondary | ICD-10-CM | POA: Diagnosis not present

## 2018-04-17 DIAGNOSIS — I509 Heart failure, unspecified: Secondary | ICD-10-CM | POA: Diagnosis not present

## 2018-04-17 DIAGNOSIS — R69 Illness, unspecified: Secondary | ICD-10-CM | POA: Diagnosis not present

## 2018-04-17 DIAGNOSIS — M79671 Pain in right foot: Secondary | ICD-10-CM | POA: Diagnosis not present

## 2018-04-17 DIAGNOSIS — M79672 Pain in left foot: Secondary | ICD-10-CM | POA: Diagnosis not present

## 2018-04-20 DIAGNOSIS — D649 Anemia, unspecified: Secondary | ICD-10-CM | POA: Diagnosis not present

## 2018-04-20 DIAGNOSIS — Z79899 Other long term (current) drug therapy: Secondary | ICD-10-CM | POA: Diagnosis not present

## 2018-04-21 DIAGNOSIS — R69 Illness, unspecified: Secondary | ICD-10-CM | POA: Diagnosis not present

## 2018-04-21 DIAGNOSIS — K219 Gastro-esophageal reflux disease without esophagitis: Secondary | ICD-10-CM | POA: Diagnosis not present

## 2018-04-21 DIAGNOSIS — E559 Vitamin D deficiency, unspecified: Secondary | ICD-10-CM | POA: Diagnosis not present

## 2018-04-21 DIAGNOSIS — I509 Heart failure, unspecified: Secondary | ICD-10-CM | POA: Diagnosis not present

## 2018-04-21 DIAGNOSIS — R062 Wheezing: Secondary | ICD-10-CM | POA: Diagnosis not present

## 2018-04-22 DIAGNOSIS — D649 Anemia, unspecified: Secondary | ICD-10-CM | POA: Diagnosis not present

## 2018-04-22 DIAGNOSIS — Z79899 Other long term (current) drug therapy: Secondary | ICD-10-CM | POA: Diagnosis not present

## 2018-04-28 DIAGNOSIS — K219 Gastro-esophageal reflux disease without esophagitis: Secondary | ICD-10-CM | POA: Diagnosis not present

## 2018-04-28 DIAGNOSIS — R69 Illness, unspecified: Secondary | ICD-10-CM | POA: Diagnosis not present

## 2018-04-28 DIAGNOSIS — E559 Vitamin D deficiency, unspecified: Secondary | ICD-10-CM | POA: Diagnosis not present

## 2018-04-28 DIAGNOSIS — I509 Heart failure, unspecified: Secondary | ICD-10-CM | POA: Diagnosis not present

## 2018-05-27 DIAGNOSIS — Z0000 Encounter for general adult medical examination without abnormal findings: Secondary | ICD-10-CM | POA: Diagnosis not present

## 2018-05-27 DIAGNOSIS — J449 Chronic obstructive pulmonary disease, unspecified: Secondary | ICD-10-CM | POA: Diagnosis not present

## 2018-05-27 DIAGNOSIS — I2699 Other pulmonary embolism without acute cor pulmonale: Secondary | ICD-10-CM | POA: Diagnosis not present

## 2018-05-27 DIAGNOSIS — K219 Gastro-esophageal reflux disease without esophagitis: Secondary | ICD-10-CM | POA: Diagnosis not present

## 2018-05-27 DIAGNOSIS — E559 Vitamin D deficiency, unspecified: Secondary | ICD-10-CM | POA: Diagnosis not present

## 2018-06-01 DIAGNOSIS — M25561 Pain in right knee: Secondary | ICD-10-CM | POA: Diagnosis not present

## 2018-06-01 DIAGNOSIS — R69 Illness, unspecified: Secondary | ICD-10-CM | POA: Diagnosis not present

## 2018-06-01 DIAGNOSIS — M25562 Pain in left knee: Secondary | ICD-10-CM | POA: Diagnosis not present

## 2018-06-10 DIAGNOSIS — I5033 Acute on chronic diastolic (congestive) heart failure: Secondary | ICD-10-CM | POA: Diagnosis not present

## 2018-06-10 DIAGNOSIS — J449 Chronic obstructive pulmonary disease, unspecified: Secondary | ICD-10-CM | POA: Diagnosis not present

## 2018-06-10 DIAGNOSIS — I69354 Hemiplegia and hemiparesis following cerebral infarction affecting left non-dominant side: Secondary | ICD-10-CM | POA: Diagnosis not present

## 2018-06-10 DIAGNOSIS — I2699 Other pulmonary embolism without acute cor pulmonale: Secondary | ICD-10-CM | POA: Diagnosis not present

## 2018-06-18 DIAGNOSIS — J9622 Acute and chronic respiratory failure with hypercapnia: Secondary | ICD-10-CM | POA: Diagnosis not present

## 2018-06-18 DIAGNOSIS — R509 Fever, unspecified: Secondary | ICD-10-CM | POA: Diagnosis not present

## 2018-06-18 DIAGNOSIS — J9621 Acute and chronic respiratory failure with hypoxia: Secondary | ICD-10-CM | POA: Diagnosis not present

## 2018-06-18 DIAGNOSIS — R609 Edema, unspecified: Secondary | ICD-10-CM | POA: Diagnosis not present

## 2018-06-18 DIAGNOSIS — R0602 Shortness of breath: Secondary | ICD-10-CM | POA: Diagnosis not present

## 2018-06-18 DIAGNOSIS — J189 Pneumonia, unspecified organism: Secondary | ICD-10-CM | POA: Diagnosis not present

## 2018-06-18 DIAGNOSIS — R05 Cough: Secondary | ICD-10-CM | POA: Diagnosis not present

## 2018-06-19 DIAGNOSIS — E860 Dehydration: Secondary | ICD-10-CM | POA: Diagnosis not present

## 2018-06-19 DIAGNOSIS — J440 Chronic obstructive pulmonary disease with acute lower respiratory infection: Secondary | ICD-10-CM | POA: Diagnosis not present

## 2018-06-19 DIAGNOSIS — J9622 Acute and chronic respiratory failure with hypercapnia: Secondary | ICD-10-CM | POA: Diagnosis not present

## 2018-06-19 DIAGNOSIS — R410 Disorientation, unspecified: Secondary | ICD-10-CM | POA: Diagnosis not present

## 2018-06-19 DIAGNOSIS — J209 Acute bronchitis, unspecified: Secondary | ICD-10-CM | POA: Diagnosis not present

## 2018-06-19 DIAGNOSIS — Z7401 Bed confinement status: Secondary | ICD-10-CM | POA: Diagnosis not present

## 2018-06-19 DIAGNOSIS — R05 Cough: Secondary | ICD-10-CM | POA: Diagnosis not present

## 2018-06-19 DIAGNOSIS — F0390 Unspecified dementia without behavioral disturbance: Secondary | ICD-10-CM

## 2018-06-19 DIAGNOSIS — J189 Pneumonia, unspecified organism: Secondary | ICD-10-CM | POA: Diagnosis not present

## 2018-06-19 DIAGNOSIS — I5032 Chronic diastolic (congestive) heart failure: Secondary | ICD-10-CM | POA: Diagnosis not present

## 2018-06-19 DIAGNOSIS — Z86711 Personal history of pulmonary embolism: Secondary | ICD-10-CM | POA: Diagnosis not present

## 2018-06-19 DIAGNOSIS — I4891 Unspecified atrial fibrillation: Secondary | ICD-10-CM | POA: Diagnosis not present

## 2018-06-19 DIAGNOSIS — J9691 Respiratory failure, unspecified with hypoxia: Secondary | ICD-10-CM | POA: Diagnosis not present

## 2018-06-19 DIAGNOSIS — M2550 Pain in unspecified joint: Secondary | ICD-10-CM | POA: Diagnosis not present

## 2018-06-19 DIAGNOSIS — J449 Chronic obstructive pulmonary disease, unspecified: Secondary | ICD-10-CM | POA: Diagnosis not present

## 2018-06-19 DIAGNOSIS — Z6841 Body Mass Index (BMI) 40.0 and over, adult: Secondary | ICD-10-CM | POA: Diagnosis not present

## 2018-06-19 DIAGNOSIS — J9621 Acute and chronic respiratory failure with hypoxia: Secondary | ICD-10-CM | POA: Diagnosis not present

## 2018-06-19 DIAGNOSIS — R69 Illness, unspecified: Secondary | ICD-10-CM | POA: Diagnosis not present

## 2018-06-19 DIAGNOSIS — R0602 Shortness of breath: Secondary | ICD-10-CM | POA: Diagnosis not present

## 2018-06-19 DIAGNOSIS — J441 Chronic obstructive pulmonary disease with (acute) exacerbation: Secondary | ICD-10-CM | POA: Diagnosis not present

## 2018-06-21 DIAGNOSIS — M25562 Pain in left knee: Secondary | ICD-10-CM | POA: Diagnosis not present

## 2018-06-21 DIAGNOSIS — I509 Heart failure, unspecified: Secondary | ICD-10-CM | POA: Diagnosis not present

## 2018-06-21 DIAGNOSIS — M25561 Pain in right knee: Secondary | ICD-10-CM | POA: Diagnosis not present

## 2018-06-21 DIAGNOSIS — R69 Illness, unspecified: Secondary | ICD-10-CM | POA: Diagnosis not present

## 2018-06-23 DIAGNOSIS — Z79899 Other long term (current) drug therapy: Secondary | ICD-10-CM | POA: Diagnosis not present

## 2018-06-23 DIAGNOSIS — D649 Anemia, unspecified: Secondary | ICD-10-CM | POA: Diagnosis not present

## 2018-06-29 DIAGNOSIS — R569 Unspecified convulsions: Secondary | ICD-10-CM | POA: Diagnosis not present

## 2018-06-29 DIAGNOSIS — Z79899 Other long term (current) drug therapy: Secondary | ICD-10-CM | POA: Diagnosis not present

## 2018-06-30 DIAGNOSIS — R05 Cough: Secondary | ICD-10-CM | POA: Diagnosis not present

## 2018-07-05 DIAGNOSIS — Z79899 Other long term (current) drug therapy: Secondary | ICD-10-CM | POA: Diagnosis not present

## 2018-07-05 DIAGNOSIS — R05 Cough: Secondary | ICD-10-CM | POA: Diagnosis not present

## 2018-07-05 DIAGNOSIS — J811 Chronic pulmonary edema: Secondary | ICD-10-CM | POA: Diagnosis not present

## 2018-07-05 DIAGNOSIS — D649 Anemia, unspecified: Secondary | ICD-10-CM | POA: Diagnosis not present

## 2018-07-06 DIAGNOSIS — N183 Chronic kidney disease, stage 3 (moderate): Secondary | ICD-10-CM | POA: Diagnosis not present

## 2018-07-06 DIAGNOSIS — I509 Heart failure, unspecified: Secondary | ICD-10-CM | POA: Diagnosis not present

## 2018-07-06 DIAGNOSIS — J449 Chronic obstructive pulmonary disease, unspecified: Secondary | ICD-10-CM | POA: Diagnosis not present

## 2018-07-06 DIAGNOSIS — J189 Pneumonia, unspecified organism: Secondary | ICD-10-CM | POA: Diagnosis not present

## 2018-07-31 DIAGNOSIS — N183 Chronic kidney disease, stage 3 (moderate): Secondary | ICD-10-CM | POA: Diagnosis not present

## 2018-07-31 DIAGNOSIS — I509 Heart failure, unspecified: Secondary | ICD-10-CM | POA: Diagnosis not present

## 2018-07-31 DIAGNOSIS — J449 Chronic obstructive pulmonary disease, unspecified: Secondary | ICD-10-CM | POA: Diagnosis not present

## 2018-07-31 DIAGNOSIS — K219 Gastro-esophageal reflux disease without esophagitis: Secondary | ICD-10-CM | POA: Diagnosis not present

## 2018-08-01 DIAGNOSIS — Z79899 Other long term (current) drug therapy: Secondary | ICD-10-CM | POA: Diagnosis not present

## 2018-08-01 DIAGNOSIS — D649 Anemia, unspecified: Secondary | ICD-10-CM | POA: Diagnosis not present

## 2018-08-19 DIAGNOSIS — I5033 Acute on chronic diastolic (congestive) heart failure: Secondary | ICD-10-CM | POA: Diagnosis not present

## 2018-08-19 DIAGNOSIS — J449 Chronic obstructive pulmonary disease, unspecified: Secondary | ICD-10-CM | POA: Diagnosis not present

## 2018-08-19 DIAGNOSIS — I69354 Hemiplegia and hemiparesis following cerebral infarction affecting left non-dominant side: Secondary | ICD-10-CM | POA: Diagnosis not present

## 2018-08-19 DIAGNOSIS — K219 Gastro-esophageal reflux disease without esophagitis: Secondary | ICD-10-CM | POA: Diagnosis not present

## 2018-08-28 DIAGNOSIS — K219 Gastro-esophageal reflux disease without esophagitis: Secondary | ICD-10-CM | POA: Diagnosis not present

## 2018-08-28 DIAGNOSIS — I5032 Chronic diastolic (congestive) heart failure: Secondary | ICD-10-CM | POA: Diagnosis not present

## 2018-08-28 DIAGNOSIS — N183 Chronic kidney disease, stage 3 (moderate): Secondary | ICD-10-CM | POA: Diagnosis not present

## 2018-08-28 DIAGNOSIS — J449 Chronic obstructive pulmonary disease, unspecified: Secondary | ICD-10-CM | POA: Diagnosis not present

## 2018-09-08 DIAGNOSIS — I5033 Acute on chronic diastolic (congestive) heart failure: Secondary | ICD-10-CM | POA: Diagnosis not present

## 2018-09-08 DIAGNOSIS — J449 Chronic obstructive pulmonary disease, unspecified: Secondary | ICD-10-CM | POA: Diagnosis not present

## 2018-09-08 DIAGNOSIS — I2699 Other pulmonary embolism without acute cor pulmonale: Secondary | ICD-10-CM | POA: Diagnosis not present

## 2018-09-08 DIAGNOSIS — I69354 Hemiplegia and hemiparesis following cerebral infarction affecting left non-dominant side: Secondary | ICD-10-CM | POA: Diagnosis not present

## 2018-09-19 DIAGNOSIS — R05 Cough: Secondary | ICD-10-CM | POA: Diagnosis not present

## 2018-09-19 DIAGNOSIS — R062 Wheezing: Secondary | ICD-10-CM | POA: Diagnosis not present

## 2018-09-20 DIAGNOSIS — J189 Pneumonia, unspecified organism: Secondary | ICD-10-CM | POA: Diagnosis not present

## 2018-09-20 DIAGNOSIS — D649 Anemia, unspecified: Secondary | ICD-10-CM | POA: Diagnosis not present

## 2018-09-21 DIAGNOSIS — J156 Pneumonia due to other aerobic Gram-negative bacteria: Secondary | ICD-10-CM | POA: Diagnosis not present

## 2018-09-21 DIAGNOSIS — G301 Alzheimer's disease with late onset: Secondary | ICD-10-CM | POA: Diagnosis not present

## 2018-09-21 DIAGNOSIS — N183 Chronic kidney disease, stage 3 (moderate): Secondary | ICD-10-CM | POA: Diagnosis not present

## 2018-09-22 DIAGNOSIS — E878 Other disorders of electrolyte and fluid balance, not elsewhere classified: Secondary | ICD-10-CM | POA: Diagnosis not present

## 2018-09-22 DIAGNOSIS — D649 Anemia, unspecified: Secondary | ICD-10-CM | POA: Diagnosis not present

## 2018-09-23 DIAGNOSIS — R945 Abnormal results of liver function studies: Secondary | ICD-10-CM | POA: Diagnosis not present

## 2018-09-23 DIAGNOSIS — Z0189 Encounter for other specified special examinations: Secondary | ICD-10-CM | POA: Diagnosis not present

## 2018-09-23 DIAGNOSIS — D649 Anemia, unspecified: Secondary | ICD-10-CM | POA: Diagnosis not present

## 2018-09-24 DIAGNOSIS — R945 Abnormal results of liver function studies: Secondary | ICD-10-CM | POA: Diagnosis not present

## 2018-09-25 DIAGNOSIS — R740 Nonspecific elevation of levels of transaminase and lactic acid dehydrogenase [LDH]: Secondary | ICD-10-CM | POA: Diagnosis not present

## 2018-09-25 DIAGNOSIS — J449 Chronic obstructive pulmonary disease, unspecified: Secondary | ICD-10-CM | POA: Diagnosis not present

## 2018-09-25 DIAGNOSIS — I5032 Chronic diastolic (congestive) heart failure: Secondary | ICD-10-CM | POA: Diagnosis not present

## 2018-09-25 DIAGNOSIS — J156 Pneumonia due to other aerobic Gram-negative bacteria: Secondary | ICD-10-CM | POA: Diagnosis not present

## 2018-09-26 DIAGNOSIS — R945 Abnormal results of liver function studies: Secondary | ICD-10-CM | POA: Diagnosis not present

## 2018-09-26 DIAGNOSIS — D649 Anemia, unspecified: Secondary | ICD-10-CM | POA: Diagnosis not present

## 2018-10-06 DIAGNOSIS — I69354 Hemiplegia and hemiparesis following cerebral infarction affecting left non-dominant side: Secondary | ICD-10-CM | POA: Diagnosis not present

## 2018-10-06 DIAGNOSIS — I2699 Other pulmonary embolism without acute cor pulmonale: Secondary | ICD-10-CM | POA: Diagnosis not present

## 2018-10-06 DIAGNOSIS — J810 Acute pulmonary edema: Secondary | ICD-10-CM | POA: Diagnosis not present

## 2018-10-06 DIAGNOSIS — I5033 Acute on chronic diastolic (congestive) heart failure: Secondary | ICD-10-CM | POA: Diagnosis not present

## 2018-10-10 DIAGNOSIS — G301 Alzheimer's disease with late onset: Secondary | ICD-10-CM | POA: Diagnosis not present

## 2018-10-10 DIAGNOSIS — J449 Chronic obstructive pulmonary disease, unspecified: Secondary | ICD-10-CM | POA: Diagnosis not present

## 2018-10-10 DIAGNOSIS — R0602 Shortness of breath: Secondary | ICD-10-CM | POA: Diagnosis not present

## 2018-10-10 DIAGNOSIS — I5032 Chronic diastolic (congestive) heart failure: Secondary | ICD-10-CM | POA: Diagnosis not present

## 2018-10-11 DIAGNOSIS — J449 Chronic obstructive pulmonary disease, unspecified: Secondary | ICD-10-CM | POA: Diagnosis not present

## 2018-10-11 DIAGNOSIS — D649 Anemia, unspecified: Secondary | ICD-10-CM | POA: Diagnosis not present

## 2018-10-11 DIAGNOSIS — N183 Chronic kidney disease, stage 3 (moderate): Secondary | ICD-10-CM | POA: Diagnosis not present

## 2018-10-26 DIAGNOSIS — J156 Pneumonia due to other aerobic Gram-negative bacteria: Secondary | ICD-10-CM | POA: Diagnosis not present

## 2018-10-26 DIAGNOSIS — N183 Chronic kidney disease, stage 3 (moderate): Secondary | ICD-10-CM | POA: Diagnosis not present

## 2018-10-26 DIAGNOSIS — I5032 Chronic diastolic (congestive) heart failure: Secondary | ICD-10-CM | POA: Diagnosis not present

## 2018-10-26 DIAGNOSIS — R740 Nonspecific elevation of levels of transaminase and lactic acid dehydrogenase [LDH]: Secondary | ICD-10-CM | POA: Diagnosis not present

## 2018-10-26 DIAGNOSIS — R6889 Other general symptoms and signs: Secondary | ICD-10-CM | POA: Diagnosis not present

## 2018-10-27 DIAGNOSIS — E785 Hyperlipidemia, unspecified: Secondary | ICD-10-CM | POA: Diagnosis not present

## 2018-10-27 DIAGNOSIS — E039 Hypothyroidism, unspecified: Secondary | ICD-10-CM | POA: Diagnosis not present

## 2018-10-27 DIAGNOSIS — E119 Type 2 diabetes mellitus without complications: Secondary | ICD-10-CM | POA: Diagnosis not present

## 2018-10-27 DIAGNOSIS — E559 Vitamin D deficiency, unspecified: Secondary | ICD-10-CM | POA: Diagnosis not present

## 2018-10-27 DIAGNOSIS — J449 Chronic obstructive pulmonary disease, unspecified: Secondary | ICD-10-CM | POA: Diagnosis not present

## 2018-10-27 DIAGNOSIS — D649 Anemia, unspecified: Secondary | ICD-10-CM | POA: Diagnosis not present

## 2018-11-07 DIAGNOSIS — R69 Illness, unspecified: Secondary | ICD-10-CM | POA: Diagnosis not present

## 2018-11-07 DIAGNOSIS — H04123 Dry eye syndrome of bilateral lacrimal glands: Secondary | ICD-10-CM | POA: Diagnosis not present

## 2018-11-07 DIAGNOSIS — H2513 Age-related nuclear cataract, bilateral: Secondary | ICD-10-CM | POA: Diagnosis not present

## 2018-11-07 DIAGNOSIS — H43813 Vitreous degeneration, bilateral: Secondary | ICD-10-CM | POA: Diagnosis not present

## 2018-11-10 DIAGNOSIS — I739 Peripheral vascular disease, unspecified: Secondary | ICD-10-CM | POA: Diagnosis not present

## 2018-11-10 DIAGNOSIS — L603 Nail dystrophy: Secondary | ICD-10-CM | POA: Diagnosis not present

## 2018-11-10 DIAGNOSIS — B351 Tinea unguium: Secondary | ICD-10-CM | POA: Diagnosis not present

## 2018-11-11 DIAGNOSIS — Z20828 Contact with and (suspected) exposure to other viral communicable diseases: Secondary | ICD-10-CM | POA: Diagnosis not present

## 2018-11-13 DIAGNOSIS — E559 Vitamin D deficiency, unspecified: Secondary | ICD-10-CM | POA: Diagnosis not present

## 2018-11-13 DIAGNOSIS — I509 Heart failure, unspecified: Secondary | ICD-10-CM | POA: Diagnosis not present

## 2018-11-13 DIAGNOSIS — E785 Hyperlipidemia, unspecified: Secondary | ICD-10-CM | POA: Diagnosis not present

## 2018-11-13 DIAGNOSIS — R69 Illness, unspecified: Secondary | ICD-10-CM | POA: Diagnosis not present

## 2018-11-23 DIAGNOSIS — E559 Vitamin D deficiency, unspecified: Secondary | ICD-10-CM | POA: Diagnosis not present

## 2018-11-23 DIAGNOSIS — J449 Chronic obstructive pulmonary disease, unspecified: Secondary | ICD-10-CM | POA: Diagnosis not present

## 2018-11-23 DIAGNOSIS — N183 Chronic kidney disease, stage 3 (moderate): Secondary | ICD-10-CM | POA: Diagnosis not present

## 2018-11-23 DIAGNOSIS — I5032 Chronic diastolic (congestive) heart failure: Secondary | ICD-10-CM | POA: Diagnosis not present

## 2018-11-23 DIAGNOSIS — Z20828 Contact with and (suspected) exposure to other viral communicable diseases: Secondary | ICD-10-CM | POA: Diagnosis not present

## 2018-11-29 DIAGNOSIS — Z20828 Contact with and (suspected) exposure to other viral communicable diseases: Secondary | ICD-10-CM | POA: Diagnosis not present

## 2018-12-06 DIAGNOSIS — Z20828 Contact with and (suspected) exposure to other viral communicable diseases: Secondary | ICD-10-CM | POA: Diagnosis not present

## 2018-12-13 DIAGNOSIS — Z20828 Contact with and (suspected) exposure to other viral communicable diseases: Secondary | ICD-10-CM | POA: Diagnosis not present

## 2018-12-16 DIAGNOSIS — K900 Celiac disease: Secondary | ICD-10-CM | POA: Diagnosis not present

## 2018-12-16 DIAGNOSIS — I509 Heart failure, unspecified: Secondary | ICD-10-CM | POA: Diagnosis not present

## 2018-12-16 DIAGNOSIS — J810 Acute pulmonary edema: Secondary | ICD-10-CM | POA: Diagnosis not present

## 2018-12-16 DIAGNOSIS — R69 Illness, unspecified: Secondary | ICD-10-CM | POA: Diagnosis not present

## 2018-12-18 DIAGNOSIS — R4189 Other symptoms and signs involving cognitive functions and awareness: Secondary | ICD-10-CM | POA: Diagnosis not present

## 2018-12-18 DIAGNOSIS — K219 Gastro-esophageal reflux disease without esophagitis: Secondary | ICD-10-CM | POA: Diagnosis not present

## 2018-12-18 DIAGNOSIS — E559 Vitamin D deficiency, unspecified: Secondary | ICD-10-CM | POA: Diagnosis not present

## 2018-12-18 DIAGNOSIS — I69354 Hemiplegia and hemiparesis following cerebral infarction affecting left non-dominant side: Secondary | ICD-10-CM | POA: Diagnosis not present

## 2018-12-18 DIAGNOSIS — R69 Illness, unspecified: Secondary | ICD-10-CM | POA: Diagnosis not present

## 2018-12-18 DIAGNOSIS — Z1159 Encounter for screening for other viral diseases: Secondary | ICD-10-CM | POA: Diagnosis not present

## 2018-12-18 DIAGNOSIS — J449 Chronic obstructive pulmonary disease, unspecified: Secondary | ICD-10-CM | POA: Diagnosis not present

## 2018-12-27 DIAGNOSIS — Z03818 Encounter for observation for suspected exposure to other biological agents ruled out: Secondary | ICD-10-CM | POA: Diagnosis not present

## 2019-01-01 DIAGNOSIS — Z03818 Encounter for observation for suspected exposure to other biological agents ruled out: Secondary | ICD-10-CM | POA: Diagnosis not present

## 2019-01-08 DIAGNOSIS — Z03818 Encounter for observation for suspected exposure to other biological agents ruled out: Secondary | ICD-10-CM | POA: Diagnosis not present

## 2019-01-15 DIAGNOSIS — J449 Chronic obstructive pulmonary disease, unspecified: Secondary | ICD-10-CM | POA: Diagnosis not present

## 2019-01-15 DIAGNOSIS — I5032 Chronic diastolic (congestive) heart failure: Secondary | ICD-10-CM | POA: Diagnosis not present

## 2019-01-15 DIAGNOSIS — K219 Gastro-esophageal reflux disease without esophagitis: Secondary | ICD-10-CM | POA: Diagnosis not present

## 2019-01-15 DIAGNOSIS — N183 Chronic kidney disease, stage 3 (moderate): Secondary | ICD-10-CM | POA: Diagnosis not present

## 2019-01-15 DIAGNOSIS — Z1159 Encounter for screening for other viral diseases: Secondary | ICD-10-CM | POA: Diagnosis not present

## 2019-01-16 DIAGNOSIS — N183 Chronic kidney disease, stage 3 (moderate): Secondary | ICD-10-CM | POA: Diagnosis not present

## 2019-01-16 DIAGNOSIS — R945 Abnormal results of liver function studies: Secondary | ICD-10-CM | POA: Diagnosis not present

## 2019-01-16 DIAGNOSIS — E559 Vitamin D deficiency, unspecified: Secondary | ICD-10-CM | POA: Diagnosis not present

## 2019-01-16 DIAGNOSIS — D649 Anemia, unspecified: Secondary | ICD-10-CM | POA: Diagnosis not present

## 2019-01-16 DIAGNOSIS — E119 Type 2 diabetes mellitus without complications: Secondary | ICD-10-CM | POA: Diagnosis not present

## 2019-01-16 DIAGNOSIS — Z79899 Other long term (current) drug therapy: Secondary | ICD-10-CM | POA: Diagnosis not present

## 2019-01-16 DIAGNOSIS — R7989 Other specified abnormal findings of blood chemistry: Secondary | ICD-10-CM | POA: Diagnosis not present

## 2019-01-19 DIAGNOSIS — M79675 Pain in left toe(s): Secondary | ICD-10-CM | POA: Diagnosis not present

## 2019-01-19 DIAGNOSIS — L853 Xerosis cutis: Secondary | ICD-10-CM | POA: Diagnosis not present

## 2019-01-19 DIAGNOSIS — M79674 Pain in right toe(s): Secondary | ICD-10-CM | POA: Diagnosis not present

## 2019-01-19 DIAGNOSIS — Z7901 Long term (current) use of anticoagulants: Secondary | ICD-10-CM | POA: Diagnosis not present

## 2019-01-19 DIAGNOSIS — B351 Tinea unguium: Secondary | ICD-10-CM | POA: Diagnosis not present

## 2019-01-19 DIAGNOSIS — D649 Anemia, unspecified: Secondary | ICD-10-CM | POA: Diagnosis not present

## 2019-01-19 DIAGNOSIS — R7989 Other specified abnormal findings of blood chemistry: Secondary | ICD-10-CM | POA: Diagnosis not present

## 2019-01-22 DIAGNOSIS — Z03818 Encounter for observation for suspected exposure to other biological agents ruled out: Secondary | ICD-10-CM | POA: Diagnosis not present

## 2019-01-26 DIAGNOSIS — R4189 Other symptoms and signs involving cognitive functions and awareness: Secondary | ICD-10-CM | POA: Diagnosis not present

## 2019-01-26 DIAGNOSIS — R69 Illness, unspecified: Secondary | ICD-10-CM | POA: Diagnosis not present

## 2019-01-27 DIAGNOSIS — E785 Hyperlipidemia, unspecified: Secondary | ICD-10-CM | POA: Diagnosis not present

## 2019-01-27 DIAGNOSIS — J449 Chronic obstructive pulmonary disease, unspecified: Secondary | ICD-10-CM | POA: Diagnosis not present

## 2019-01-27 DIAGNOSIS — K219 Gastro-esophageal reflux disease without esophagitis: Secondary | ICD-10-CM | POA: Diagnosis not present

## 2019-01-27 DIAGNOSIS — E559 Vitamin D deficiency, unspecified: Secondary | ICD-10-CM | POA: Diagnosis not present

## 2019-01-29 DIAGNOSIS — Z03818 Encounter for observation for suspected exposure to other biological agents ruled out: Secondary | ICD-10-CM | POA: Diagnosis not present

## 2019-02-05 DIAGNOSIS — Z03818 Encounter for observation for suspected exposure to other biological agents ruled out: Secondary | ICD-10-CM | POA: Diagnosis not present

## 2019-02-12 DIAGNOSIS — E785 Hyperlipidemia, unspecified: Secondary | ICD-10-CM | POA: Diagnosis not present

## 2019-02-12 DIAGNOSIS — N183 Chronic kidney disease, stage 3 (moderate): Secondary | ICD-10-CM | POA: Diagnosis not present

## 2019-02-12 DIAGNOSIS — I5032 Chronic diastolic (congestive) heart failure: Secondary | ICD-10-CM | POA: Diagnosis not present

## 2019-02-12 DIAGNOSIS — Z03818 Encounter for observation for suspected exposure to other biological agents ruled out: Secondary | ICD-10-CM | POA: Diagnosis not present

## 2019-02-12 DIAGNOSIS — K219 Gastro-esophageal reflux disease without esophagitis: Secondary | ICD-10-CM | POA: Diagnosis not present

## 2019-02-17 DIAGNOSIS — K219 Gastro-esophageal reflux disease without esophagitis: Secondary | ICD-10-CM | POA: Diagnosis not present

## 2019-02-17 DIAGNOSIS — J449 Chronic obstructive pulmonary disease, unspecified: Secondary | ICD-10-CM | POA: Diagnosis not present

## 2019-02-17 DIAGNOSIS — E559 Vitamin D deficiency, unspecified: Secondary | ICD-10-CM | POA: Diagnosis not present

## 2019-02-17 DIAGNOSIS — E785 Hyperlipidemia, unspecified: Secondary | ICD-10-CM | POA: Diagnosis not present

## 2019-02-23 DIAGNOSIS — R4189 Other symptoms and signs involving cognitive functions and awareness: Secondary | ICD-10-CM | POA: Diagnosis not present

## 2019-02-23 DIAGNOSIS — R69 Illness, unspecified: Secondary | ICD-10-CM | POA: Diagnosis not present

## 2019-03-12 DIAGNOSIS — I69354 Hemiplegia and hemiparesis following cerebral infarction affecting left non-dominant side: Secondary | ICD-10-CM | POA: Diagnosis not present

## 2019-03-12 DIAGNOSIS — I5032 Chronic diastolic (congestive) heart failure: Secondary | ICD-10-CM | POA: Diagnosis not present

## 2019-03-12 DIAGNOSIS — E785 Hyperlipidemia, unspecified: Secondary | ICD-10-CM | POA: Diagnosis not present

## 2019-03-12 DIAGNOSIS — I110 Hypertensive heart disease with heart failure: Secondary | ICD-10-CM | POA: Diagnosis not present

## 2019-03-14 DIAGNOSIS — I5020 Unspecified systolic (congestive) heart failure: Secondary | ICD-10-CM | POA: Diagnosis not present

## 2019-03-14 DIAGNOSIS — D649 Anemia, unspecified: Secondary | ICD-10-CM | POA: Diagnosis not present

## 2019-03-14 DIAGNOSIS — N183 Chronic kidney disease, stage 3 unspecified: Secondary | ICD-10-CM | POA: Diagnosis not present

## 2019-03-18 DIAGNOSIS — I69354 Hemiplegia and hemiparesis following cerebral infarction affecting left non-dominant side: Secondary | ICD-10-CM | POA: Diagnosis not present

## 2019-03-18 DIAGNOSIS — I509 Heart failure, unspecified: Secondary | ICD-10-CM | POA: Diagnosis not present

## 2019-03-18 DIAGNOSIS — K900 Celiac disease: Secondary | ICD-10-CM | POA: Diagnosis not present

## 2019-03-18 DIAGNOSIS — I700 Atherosclerosis of aorta: Secondary | ICD-10-CM | POA: Diagnosis not present

## 2019-03-22 DIAGNOSIS — M79674 Pain in right toe(s): Secondary | ICD-10-CM | POA: Diagnosis not present

## 2019-03-22 DIAGNOSIS — Z7901 Long term (current) use of anticoagulants: Secondary | ICD-10-CM | POA: Diagnosis not present

## 2019-03-22 DIAGNOSIS — M79675 Pain in left toe(s): Secondary | ICD-10-CM | POA: Diagnosis not present

## 2019-03-22 DIAGNOSIS — B351 Tinea unguium: Secondary | ICD-10-CM | POA: Diagnosis not present

## 2019-03-23 DIAGNOSIS — R4189 Other symptoms and signs involving cognitive functions and awareness: Secondary | ICD-10-CM | POA: Diagnosis not present

## 2019-03-23 DIAGNOSIS — R69 Illness, unspecified: Secondary | ICD-10-CM | POA: Diagnosis not present

## 2019-03-27 DIAGNOSIS — J449 Chronic obstructive pulmonary disease, unspecified: Secondary | ICD-10-CM | POA: Diagnosis not present

## 2019-03-27 DIAGNOSIS — F329 Major depressive disorder, single episode, unspecified: Secondary | ICD-10-CM | POA: Diagnosis not present

## 2019-03-27 DIAGNOSIS — R69 Illness, unspecified: Secondary | ICD-10-CM | POA: Diagnosis not present

## 2019-04-09 DIAGNOSIS — E785 Hyperlipidemia, unspecified: Secondary | ICD-10-CM | POA: Diagnosis not present

## 2019-04-09 DIAGNOSIS — I5032 Chronic diastolic (congestive) heart failure: Secondary | ICD-10-CM | POA: Diagnosis not present

## 2019-04-09 DIAGNOSIS — I110 Hypertensive heart disease with heart failure: Secondary | ICD-10-CM | POA: Diagnosis not present

## 2019-04-09 DIAGNOSIS — Z03818 Encounter for observation for suspected exposure to other biological agents ruled out: Secondary | ICD-10-CM | POA: Diagnosis not present

## 2019-04-09 DIAGNOSIS — I69354 Hemiplegia and hemiparesis following cerebral infarction affecting left non-dominant side: Secondary | ICD-10-CM | POA: Diagnosis not present

## 2019-04-10 DIAGNOSIS — J189 Pneumonia, unspecified organism: Secondary | ICD-10-CM | POA: Diagnosis not present

## 2019-04-10 DIAGNOSIS — E559 Vitamin D deficiency, unspecified: Secondary | ICD-10-CM | POA: Diagnosis not present

## 2019-04-10 DIAGNOSIS — I5020 Unspecified systolic (congestive) heart failure: Secondary | ICD-10-CM | POA: Diagnosis not present

## 2019-04-10 DIAGNOSIS — D649 Anemia, unspecified: Secondary | ICD-10-CM | POA: Diagnosis not present

## 2019-04-10 DIAGNOSIS — R569 Unspecified convulsions: Secondary | ICD-10-CM | POA: Diagnosis not present

## 2019-04-10 DIAGNOSIS — E878 Other disorders of electrolyte and fluid balance, not elsewhere classified: Secondary | ICD-10-CM | POA: Diagnosis not present

## 2019-04-16 DIAGNOSIS — Z03818 Encounter for observation for suspected exposure to other biological agents ruled out: Secondary | ICD-10-CM | POA: Diagnosis not present

## 2019-04-20 DIAGNOSIS — R4189 Other symptoms and signs involving cognitive functions and awareness: Secondary | ICD-10-CM | POA: Diagnosis not present

## 2019-04-20 DIAGNOSIS — R69 Illness, unspecified: Secondary | ICD-10-CM | POA: Diagnosis not present

## 2019-04-23 DIAGNOSIS — J189 Pneumonia, unspecified organism: Secondary | ICD-10-CM | POA: Diagnosis not present

## 2019-04-23 DIAGNOSIS — Z03818 Encounter for observation for suspected exposure to other biological agents ruled out: Secondary | ICD-10-CM | POA: Diagnosis not present

## 2019-04-23 DIAGNOSIS — J449 Chronic obstructive pulmonary disease, unspecified: Secondary | ICD-10-CM | POA: Diagnosis not present

## 2019-04-23 DIAGNOSIS — R5381 Other malaise: Secondary | ICD-10-CM | POA: Diagnosis not present

## 2019-04-23 DIAGNOSIS — D649 Anemia, unspecified: Secondary | ICD-10-CM | POA: Diagnosis not present

## 2019-04-23 DIAGNOSIS — G301 Alzheimer's disease with late onset: Secondary | ICD-10-CM | POA: Diagnosis not present

## 2019-04-23 DIAGNOSIS — R0602 Shortness of breath: Secondary | ICD-10-CM | POA: Diagnosis not present

## 2019-04-24 DIAGNOSIS — D649 Anemia, unspecified: Secondary | ICD-10-CM | POA: Diagnosis not present

## 2019-04-24 DIAGNOSIS — R0602 Shortness of breath: Secondary | ICD-10-CM | POA: Diagnosis not present

## 2019-04-26 DIAGNOSIS — R69 Illness, unspecified: Secondary | ICD-10-CM | POA: Diagnosis not present

## 2019-04-26 DIAGNOSIS — J449 Chronic obstructive pulmonary disease, unspecified: Secondary | ICD-10-CM | POA: Diagnosis not present

## 2019-04-26 DIAGNOSIS — F329 Major depressive disorder, single episode, unspecified: Secondary | ICD-10-CM | POA: Diagnosis not present

## 2019-04-29 DIAGNOSIS — I509 Heart failure, unspecified: Secondary | ICD-10-CM | POA: Diagnosis not present

## 2019-04-29 DIAGNOSIS — I700 Atherosclerosis of aorta: Secondary | ICD-10-CM | POA: Diagnosis not present

## 2019-04-29 DIAGNOSIS — K900 Celiac disease: Secondary | ICD-10-CM | POA: Diagnosis not present

## 2019-04-29 DIAGNOSIS — J189 Pneumonia, unspecified organism: Secondary | ICD-10-CM | POA: Diagnosis not present

## 2019-05-01 DIAGNOSIS — Z03818 Encounter for observation for suspected exposure to other biological agents ruled out: Secondary | ICD-10-CM | POA: Diagnosis not present

## 2019-05-07 DIAGNOSIS — Z03818 Encounter for observation for suspected exposure to other biological agents ruled out: Secondary | ICD-10-CM | POA: Diagnosis not present

## 2019-05-10 DIAGNOSIS — J449 Chronic obstructive pulmonary disease, unspecified: Secondary | ICD-10-CM | POA: Diagnosis not present

## 2019-05-10 DIAGNOSIS — N1831 Chronic kidney disease, stage 3a: Secondary | ICD-10-CM | POA: Diagnosis not present

## 2019-05-10 DIAGNOSIS — I5032 Chronic diastolic (congestive) heart failure: Secondary | ICD-10-CM | POA: Diagnosis not present

## 2019-05-10 DIAGNOSIS — I130 Hypertensive heart and chronic kidney disease with heart failure and stage 1 through stage 4 chronic kidney disease, or unspecified chronic kidney disease: Secondary | ICD-10-CM | POA: Diagnosis not present

## 2019-05-14 DIAGNOSIS — Z03818 Encounter for observation for suspected exposure to other biological agents ruled out: Secondary | ICD-10-CM | POA: Diagnosis not present

## 2019-05-16 DIAGNOSIS — E559 Vitamin D deficiency, unspecified: Secondary | ICD-10-CM | POA: Diagnosis not present

## 2019-05-16 DIAGNOSIS — I69354 Hemiplegia and hemiparesis following cerebral infarction affecting left non-dominant side: Secondary | ICD-10-CM | POA: Diagnosis not present

## 2019-05-16 DIAGNOSIS — J449 Chronic obstructive pulmonary disease, unspecified: Secondary | ICD-10-CM | POA: Diagnosis not present

## 2019-05-16 DIAGNOSIS — E785 Hyperlipidemia, unspecified: Secondary | ICD-10-CM | POA: Diagnosis not present

## 2019-05-20 DIAGNOSIS — F329 Major depressive disorder, single episode, unspecified: Secondary | ICD-10-CM | POA: Diagnosis not present

## 2019-05-20 DIAGNOSIS — J449 Chronic obstructive pulmonary disease, unspecified: Secondary | ICD-10-CM | POA: Diagnosis not present

## 2019-05-20 DIAGNOSIS — R69 Illness, unspecified: Secondary | ICD-10-CM | POA: Diagnosis not present

## 2019-05-21 DIAGNOSIS — Z03818 Encounter for observation for suspected exposure to other biological agents ruled out: Secondary | ICD-10-CM | POA: Diagnosis not present

## 2019-05-23 DIAGNOSIS — R4189 Other symptoms and signs involving cognitive functions and awareness: Secondary | ICD-10-CM | POA: Diagnosis not present

## 2019-05-23 DIAGNOSIS — R69 Illness, unspecified: Secondary | ICD-10-CM | POA: Diagnosis not present

## 2019-05-28 DIAGNOSIS — Z03818 Encounter for observation for suspected exposure to other biological agents ruled out: Secondary | ICD-10-CM | POA: Diagnosis not present

## 2020-07-22 ENCOUNTER — Emergency Department (HOSPITAL_COMMUNITY): Payer: Medicare Other

## 2020-07-22 ENCOUNTER — Inpatient Hospital Stay (HOSPITAL_COMMUNITY)
Admission: EM | Admit: 2020-07-22 | Discharge: 2020-07-27 | DRG: 637 | Disposition: A | Discharge status: Hospice | Payer: Medicare Other | Attending: Internal Medicine | Admitting: Internal Medicine

## 2020-07-22 ENCOUNTER — Encounter (HOSPITAL_COMMUNITY): Payer: Self-pay | Admitting: Emergency Medicine

## 2020-07-22 DIAGNOSIS — K219 Gastro-esophageal reflux disease without esophagitis: Secondary | ICD-10-CM | POA: Diagnosis present

## 2020-07-22 DIAGNOSIS — I69354 Hemiplegia and hemiparesis following cerebral infarction affecting left non-dominant side: Secondary | ICD-10-CM

## 2020-07-22 DIAGNOSIS — E870 Hyperosmolality and hypernatremia: Secondary | ICD-10-CM | POA: Diagnosis not present

## 2020-07-22 DIAGNOSIS — Z86711 Personal history of pulmonary embolism: Secondary | ICD-10-CM

## 2020-07-22 DIAGNOSIS — R0600 Dyspnea, unspecified: Secondary | ICD-10-CM

## 2020-07-22 DIAGNOSIS — Z66 Do not resuscitate: Secondary | ICD-10-CM | POA: Diagnosis present

## 2020-07-22 DIAGNOSIS — E785 Hyperlipidemia, unspecified: Secondary | ICD-10-CM | POA: Diagnosis present

## 2020-07-22 DIAGNOSIS — Z794 Long term (current) use of insulin: Secondary | ICD-10-CM

## 2020-07-22 DIAGNOSIS — E1101 Type 2 diabetes mellitus with hyperosmolarity with coma: Secondary | ICD-10-CM | POA: Diagnosis not present

## 2020-07-22 DIAGNOSIS — Z20822 Contact with and (suspected) exposure to covid-19: Secondary | ICD-10-CM | POA: Diagnosis present

## 2020-07-22 DIAGNOSIS — Z87891 Personal history of nicotine dependence: Secondary | ICD-10-CM

## 2020-07-22 DIAGNOSIS — G9340 Encephalopathy, unspecified: Secondary | ICD-10-CM | POA: Diagnosis present

## 2020-07-22 DIAGNOSIS — R4182 Altered mental status, unspecified: Secondary | ICD-10-CM

## 2020-07-22 DIAGNOSIS — M1990 Unspecified osteoarthritis, unspecified site: Secondary | ICD-10-CM | POA: Diagnosis present

## 2020-07-22 DIAGNOSIS — N179 Acute kidney failure, unspecified: Secondary | ICD-10-CM | POA: Diagnosis present

## 2020-07-22 DIAGNOSIS — E7800 Pure hypercholesterolemia, unspecified: Secondary | ICD-10-CM | POA: Diagnosis present

## 2020-07-22 DIAGNOSIS — G9341 Metabolic encephalopathy: Secondary | ICD-10-CM | POA: Diagnosis present

## 2020-07-22 DIAGNOSIS — Z8249 Family history of ischemic heart disease and other diseases of the circulatory system: Secondary | ICD-10-CM

## 2020-07-22 DIAGNOSIS — E860 Dehydration: Secondary | ICD-10-CM | POA: Diagnosis present

## 2020-07-22 DIAGNOSIS — Z880 Allergy status to penicillin: Secondary | ICD-10-CM

## 2020-07-22 DIAGNOSIS — Z79899 Other long term (current) drug therapy: Secondary | ICD-10-CM

## 2020-07-22 DIAGNOSIS — Z7901 Long term (current) use of anticoagulants: Secondary | ICD-10-CM

## 2020-07-22 DIAGNOSIS — E872 Acidosis: Secondary | ICD-10-CM | POA: Diagnosis not present

## 2020-07-22 DIAGNOSIS — N390 Urinary tract infection, site not specified: Secondary | ICD-10-CM | POA: Diagnosis present

## 2020-07-22 DIAGNOSIS — Z833 Family history of diabetes mellitus: Secondary | ICD-10-CM

## 2020-07-22 DIAGNOSIS — Z885 Allergy status to narcotic agent status: Secondary | ICD-10-CM

## 2020-07-22 DIAGNOSIS — F0391 Unspecified dementia with behavioral disturbance: Secondary | ICD-10-CM | POA: Diagnosis present

## 2020-07-22 DIAGNOSIS — M810 Age-related osteoporosis without current pathological fracture: Secondary | ICD-10-CM | POA: Diagnosis present

## 2020-07-22 DIAGNOSIS — J449 Chronic obstructive pulmonary disease, unspecified: Secondary | ICD-10-CM | POA: Diagnosis present

## 2020-07-22 DIAGNOSIS — I69322 Dysarthria following cerebral infarction: Secondary | ICD-10-CM

## 2020-07-22 DIAGNOSIS — F03918 Unspecified dementia, unspecified severity, with other behavioral disturbance: Secondary | ICD-10-CM | POA: Diagnosis present

## 2020-07-22 HISTORY — DX: Chronic obstructive pulmonary disease, unspecified: J44.9

## 2020-07-22 LAB — CBC WITH DIFFERENTIAL/PLATELET
Abs Immature Granulocytes: 0.04 10*3/uL (ref 0.00–0.07)
Basophils Absolute: 0 10*3/uL (ref 0.0–0.1)
Basophils Relative: 0 %
Eosinophils Absolute: 0.1 10*3/uL (ref 0.0–0.5)
Eosinophils Relative: 1 %
HCT: 46.1 % — ABNORMAL HIGH (ref 36.0–46.0)
Hemoglobin: 14 g/dL (ref 12.0–15.0)
Immature Granulocytes: 1 %
Lymphocytes Relative: 24 %
Lymphs Abs: 2.1 10*3/uL (ref 0.7–4.0)
MCH: 30.2 pg (ref 26.0–34.0)
MCHC: 30.4 g/dL (ref 30.0–36.0)
MCV: 99.4 fL (ref 80.0–100.0)
Monocytes Absolute: 0.5 10*3/uL (ref 0.1–1.0)
Monocytes Relative: 6 %
Neutro Abs: 5.8 10*3/uL (ref 1.7–7.7)
Neutrophils Relative %: 68 %
Platelets: 130 10*3/uL — ABNORMAL LOW (ref 150–400)
RBC: 4.64 MIL/uL (ref 3.87–5.11)
RDW: 17.8 % — ABNORMAL HIGH (ref 11.5–15.5)
WBC: 8.5 10*3/uL (ref 4.0–10.5)
nRBC: 0 % (ref 0.0–0.2)

## 2020-07-22 LAB — COMPREHENSIVE METABOLIC PANEL
ALT: 21 U/L (ref 0–44)
AST: 21 U/L (ref 15–41)
Albumin: 2.5 g/dL — ABNORMAL LOW (ref 3.5–5.0)
Alkaline Phosphatase: 89 U/L (ref 38–126)
Anion gap: 13 (ref 5–15)
BUN: 71 mg/dL — ABNORMAL HIGH (ref 8–23)
CO2: 30 mmol/L (ref 22–32)
Calcium: 8.8 mg/dL — ABNORMAL LOW (ref 8.9–10.3)
Chloride: 117 mmol/L — ABNORMAL HIGH (ref 98–111)
Creatinine, Ser: 1.44 mg/dL — ABNORMAL HIGH (ref 0.44–1.00)
GFR, Estimated: 36 mL/min — ABNORMAL LOW (ref >60)
Glucose, Bld: 392 mg/dL — ABNORMAL HIGH (ref 70–99)
Potassium: 4.4 mmol/L (ref 3.5–5.1)
Sodium: 160 mmol/L — ABNORMAL HIGH (ref 135–145)
Total Bilirubin: 0.5 mg/dL (ref 0.3–1.2)
Total Protein: 6 g/dL — ABNORMAL LOW (ref 6.5–8.1)

## 2020-07-22 LAB — LACTIC ACID, PLASMA: Lactic Acid, Venous: 2.7 mmol/L (ref 0.5–1.9)

## 2020-07-22 LAB — CBG MONITORING, ED: Glucose-Capillary: 340 mg/dL — ABNORMAL HIGH (ref 70–99)

## 2020-07-22 NOTE — ED Triage Notes (Signed)
Pt transported from [facility] after being found unresponsive today @ noon. IV est by staff, pt found to have BS 560s and sodium  161at that time. Pt non verbal with EMS, unkn if that is baseline. Pt given Rocephin, lantus and novolog since that time.

## 2020-07-22 NOTE — ED Notes (Signed)
Pt opens eyes to voice, no attempt at verbal communication. Old stroke per hx with L sided hemiplegia.

## 2020-07-22 NOTE — ED Provider Notes (Signed)
[facility] EMERGENCY DEPARTMENT Provider Note   CSN: [id number] Arrival date & time: 07/22/20  2053     History Chief Complaint  Patient presents with  . Hyperglycemia    Carrie Smith is a 85 y.o. female who presents emergency department for altered mental status.  History is gathered from phone call with the nursing staff.  Normally patient is nonverbal.  She is normally alert and pleasant, responsive and will take meals.  Today around 12:00 they noticed an acute change in her mental status.  She seemed listless, was not making eye top contact, staring off.  They drew labs at her facility which showed significant hypernatremia and very high blood sugar.  Normally her blood sugar is very well controlled and this represented an acute change in her mental status.  She was given Rocephin prior to arrival and IM Lantus and insulin to bring her sugars down.  She is otherwise been in her normal state of health up and till today.  HPI     Past Medical History:  Diagnosis Date  . Arthritis   . Back pain   . Celiac disease    a. versus ischemic colitis versus microscopic colitis per chart.  . Dementia ([place])   . GI bleed    a. remote GIB in setting of NSAIDs for arthritis.  . Hypercholesterolemia   . Hyperlipidemia   . Obesity   . Osteoporosis   . Stroke [facility])     Patient Active Problem List   Diagnosis Date Noted  . Abnormality of gait 02/18/2016  . Mild cognitive impairment 02/18/2016  . Pressure ulcer 04/14/2015  . Obesity 04/14/2015  . Acute encephalopathy 04/14/2015  . AKI (acute kidney injury) ([place]) 04/14/2015  . Hyperkalemia 04/14/2015  . Hypokalemia 04/14/2015  . Pulmonary embolism ([place]) 04/13/2015  . Elevated troponin 04/13/2015  . Elevated transaminase level 04/13/2015  . Rhabdomyolysis 04/13/2015  . GERD 06/25/2008  . Celiac disease 06/25/2008  . HYPERCHOLESTEROLEMIA 06/24/2008  . EXTERNAL HEMORRHOIDS 06/24/2008  . LOW BACK PAIN, CHRONIC  06/24/2008  . Osteoporosis 06/24/2008  . OTHER SPEC GASTRITIS WITHOUT MENTION HEMORRHAGE 09/06/2007  . ISCHEMIC COLITIS 09/06/2007  . DIVERTICULOSIS, COLON 09/06/2007    Past Surgical History:  Procedure Laterality Date  . BACK SURGERY    . CHOLECYSTECTOMY       OB History   No obstetric history on file.     Family History  Problem Relation Age of Onset  . Heart disease Sister        heart disease, ?blood clot  . Diabetes Mother   . Heart disease Mother   . Heart attack Father        heart attack, sudden massive    Social History   Tobacco Use  . Smoking status: Former Smoker    Years: 25.00  . Smokeless tobacco: Never Used  Substance Use Topics  . Alcohol use: No  . Drug use: No    Home Medications Prior to Admission medications   Medication Sig Start Date End Date Taking? Authorizing Provider  allopurinol (ZYLOPRIM) 300 MG tablet TAKE 1 TABLET EVERY DAY TO PREVENT GOUT 03/01/15   [provider]  atorvastatin (LIPITOR) 80 MG tablet Take 80 mg by mouth daily. 03/06/15   [provider]  Cholecalciferol (VITAMIN D3) 1000 units CAPS Take by mouth daily.    [provider]  ELIQUIS 5 MG TABS tablet Take 5 mg by mouth 2 (two) times daily. 02/04/16   [provider]  famotidine (PEPCID) 20 MG tablet  02/15/16   [provider]  ferrous sulfate 325 (65 FE) MG tablet Take 325 mg by mouth daily with breakfast.    [provider]  FLUoxetine (PROZAC) 20 MG capsule  03/02/17   [provider]  folic acid (FOLVITE) 1 MG tablet Take 1 mg by mouth daily. 11/25/15   [provider]  furosemide (LASIX) 40 MG tablet Take 1 tablet (40 mg total) by mouth daily. 04/18/15   Domenic Polite, MD  memantine (NAMENDA) 10 MG tablet Take 1 tablet (10 mg total) by mouth 2 (two) times daily. 03/22/16   Marcial Pacas, MD  metoprolol tartrate (LOPRESSOR) 25 MG tablet Take 0.5 tablets (12.5 mg total) by mouth 2 (two) times daily.  04/18/15   Domenic Polite, MD  [facility] powder  01/27/17   [provider]  potassium chloride SA (K-DUR,KLOR-CON) 20 MEQ tablet Take 2 tablets (40 mEq total) by mouth daily. 04/18/15   Domenic Polite, MD  QUEtiapine (SEROQUEL) 25 MG tablet Take 1 tablet (25 mg total) by mouth at bedtime. 05/20/16   Marcial Pacas, MD  Rivaroxaban (XARELTO) 15 MG TABS tablet Take 1 tablet (15 mg total) by mouth 2 (two) times daily with a meal. 04/18/15 05/07/15  Domenic Polite, MD    Allergies    Ampicillin, Codeine, and Sulbactam  Review of Systems   Review of Systems  Unable to perform ROS: Mental status change    Physical Exam Updated Vital Signs BP 117/63   Pulse 70   Temp (!) 97.4 F (36.3 C) (Temporal)   Resp 18   Ht 4\' 11"  (1.499 m)   Wt 88 kg   SpO2 90%   BMI 39.18 kg/m   Physical Exam Vitals and nursing note reviewed.  Constitutional:      General: She is not in acute distress.    Appearance: She is well-developed and well-nourished. She is not diaphoretic.  HENT:     Head: Normocephalic and atraumatic.  Eyes:     General: No scleral icterus.    Conjunctiva/sclera: Conjunctivae normal.  Cardiovascular:     Rate and Rhythm: Normal rate and regular rhythm.     Heart sounds: Normal heart sounds. No murmur heard. No friction rub. No gallop.   Pulmonary:     Effort: Pulmonary effort is normal. No respiratory distress.     Breath sounds: Normal breath sounds.  Abdominal:     General: Bowel sounds are normal. There is no distension.     Palpations: Abdomen is soft. There is no mass.     Tenderness: There is no abdominal tenderness. There is no guarding.  Musculoskeletal:     Cervical back: Normal range of motion.  Skin:    General: Skin is warm and dry.  Neurological:     Mental Status: She is alert.  Psychiatric:        Behavior: Behavior normal.     ED Results / Procedures / Treatments   Labs (all labs ordered are listed, but only abnormal results are  displayed) Labs Reviewed  CBG MONITORING, ED - Abnormal; Notable for the following components:      Result Value   Glucose-Capillary 340 (*)    All other components within normal limits  RESP PANEL BY RT-PCR (FLU A&B, COVID) [id number]  LACTIC ACID, PLASMA  LACTIC ACID, PLASMA  COMPREHENSIVE METABOLIC PANEL  CBC WITH DIFFERENTIAL/PLATELET  URINALYSIS, ROUTINE W REFLEX MICROSCOPIC    EKG None  Radiology No  results found.  Procedures Procedures   Medications Ordered in ED Medications - No data to display  ED Course  I have reviewed the triage vital signs and the nursing notes.  Pertinent labs & imaging results that were available during my care of the patient were reviewed by me and considered in my medical decision making (see chart for details).  Clinical Course as of 07/22/20 2343  Tue Jul 22, 2020  2237 Sodium(!): 160 [AH]  2238 Lactic Acid, Venous(!!): 2.7 [AH]    Clinical Course User Index [AH] Margarita Mail, PA-C   MDM Rules/Calculators/A&P                          CC:ams VS:  Vitals:   07/22/20 2200 07/22/20 2201 07/22/20 2300 07/22/20 2330  BP: 116/63  (!) 117/58 (!) 105/52  Pulse: 85 71 70 70  Resp: (!) 40 (!) 22 (!) 31 (!) 25  Temp:      TempSrc:      SpO2: 100% 100% 95% 96%  Weight:      Height:        RX:VQMGQQP is gathered by [employer] and phone call with staff at snf. Previous records obtained and reviewed. DDX:The patient's complaint of ams involves an extensive number of diagnostic and treatment options, and is a complaint that carries with it a high risk of complications, morbidity, and potential mortality. Given the large differential diagnosis, medical decision making is of high complexity. The differential diagnosis for AMS is extensive and includes, but is not limited to: drug overdose - opioids, alcohol, sedatives, antipsychotics, drug withdrawal, others; Metabolic: hypoxia, hypoglycemia, hyperglycemia, hypercalcemia, hypernatremia, hyponatremia,  uremia, hepatic encephalopathy, hypothyroidism, hyperthyroidism, vitamin B12 or thiamine deficiency, carbon monoxide poisoning, Wilson's disease, Lactic acidosis, DKA/HHOS; Infectious: meningitis, encephalitis, bacteremia/sepsis, urinary tract infection, pneumonia, neurosyphilis;  Space-occupying lesion, (brain tumor, subdural hematoma, hydrocephalus,); Vascular: stroke, subarachnoid hemorrhage, coronary ischemia, hypertensive encephalopathy, CNS vasculitis, thrombotic thrombocytopenic purpura, disseminated intravascular coagulation, hyperviscosity; Psychiatric: Schizophrenia, depression; Other: Seizure, hypothermia, heat stroke, ICU psychosis, dementia -"sundowning."   Labs: I ordered reviewed and interpreted labs which include  Cbc shows no significant abnormality, CMP with hyponatremia of 160, glucose is 392 AKI with creatinine of 1.44 as compared to 5 years ago.  Lactic acid elevated.  Imaging: I ordered and reviewed images which included CT head. I independently visualized and interpreted all imaging.There are no acute, significant findings on today's images. EKG: Sinus rhythm at a rate of 73 Consults: Dr. Alcario Drought for admission MDM: Patient with altered mental status, hypernatremia and lactic acidosis likely due to significant dehydration.  Patient will be admitted to the hospitalist service. Patient disposition:The patient appears reasonably stabilized for admission considering the current resources, flow, and capabilities available in the ED at this time, and I doubt any other [facility] requiring further screening and/or treatment in the ED prior to admission.        Final Clinical Impression(s) / ED Diagnoses Final diagnoses:  None    Rx / DC Orders ED Discharge Orders    None       Margarita Mail, PA-C 07/22/20 2348    Truddie Hidden, MD 07/23/20 [phone]

## 2020-07-22 NOTE — ED Notes (Signed)
Pt to CT via stretcher

## 2020-07-23 ENCOUNTER — Encounter (HOSPITAL_COMMUNITY): Payer: Self-pay | Admitting: Emergency Medicine

## 2020-07-23 ENCOUNTER — Inpatient Hospital Stay (HOSPITAL_COMMUNITY): Payer: Medicare Other

## 2020-07-23 DIAGNOSIS — E7800 Pure hypercholesterolemia, unspecified: Secondary | ICD-10-CM | POA: Diagnosis present

## 2020-07-23 DIAGNOSIS — J449 Chronic obstructive pulmonary disease, unspecified: Secondary | ICD-10-CM | POA: Diagnosis present

## 2020-07-23 DIAGNOSIS — F0391 Unspecified dementia with behavioral disturbance: Secondary | ICD-10-CM

## 2020-07-23 DIAGNOSIS — Z7901 Long term (current) use of anticoagulants: Secondary | ICD-10-CM | POA: Diagnosis not present

## 2020-07-23 DIAGNOSIS — E785 Hyperlipidemia, unspecified: Secondary | ICD-10-CM | POA: Diagnosis present

## 2020-07-23 DIAGNOSIS — E870 Hyperosmolality and hypernatremia: Secondary | ICD-10-CM | POA: Diagnosis present

## 2020-07-23 DIAGNOSIS — N390 Urinary tract infection, site not specified: Secondary | ICD-10-CM | POA: Diagnosis present

## 2020-07-23 DIAGNOSIS — Z20822 Contact with and (suspected) exposure to covid-19: Secondary | ICD-10-CM | POA: Diagnosis present

## 2020-07-23 DIAGNOSIS — M810 Age-related osteoporosis without current pathological fracture: Secondary | ICD-10-CM | POA: Diagnosis present

## 2020-07-23 DIAGNOSIS — I69322 Dysarthria following cerebral infarction: Secondary | ICD-10-CM | POA: Diagnosis not present

## 2020-07-23 DIAGNOSIS — E1101 Type 2 diabetes mellitus with hyperosmolarity with coma: Secondary | ICD-10-CM | POA: Diagnosis present

## 2020-07-23 DIAGNOSIS — N179 Acute kidney failure, unspecified: Secondary | ICD-10-CM | POA: Diagnosis present

## 2020-07-23 DIAGNOSIS — G9341 Metabolic encephalopathy: Secondary | ICD-10-CM | POA: Diagnosis present

## 2020-07-23 DIAGNOSIS — Z86711 Personal history of pulmonary embolism: Secondary | ICD-10-CM | POA: Diagnosis not present

## 2020-07-23 DIAGNOSIS — Z87891 Personal history of nicotine dependence: Secondary | ICD-10-CM | POA: Diagnosis not present

## 2020-07-23 DIAGNOSIS — F03918 Unspecified dementia, unspecified severity, with other behavioral disturbance: Secondary | ICD-10-CM | POA: Diagnosis present

## 2020-07-23 DIAGNOSIS — E860 Dehydration: Secondary | ICD-10-CM | POA: Diagnosis present

## 2020-07-23 DIAGNOSIS — E872 Acidosis: Secondary | ICD-10-CM | POA: Diagnosis not present

## 2020-07-23 DIAGNOSIS — K219 Gastro-esophageal reflux disease without esophagitis: Secondary | ICD-10-CM | POA: Diagnosis present

## 2020-07-23 DIAGNOSIS — Z66 Do not resuscitate: Secondary | ICD-10-CM | POA: Diagnosis present

## 2020-07-23 DIAGNOSIS — Z833 Family history of diabetes mellitus: Secondary | ICD-10-CM | POA: Diagnosis not present

## 2020-07-23 DIAGNOSIS — Z794 Long term (current) use of insulin: Secondary | ICD-10-CM | POA: Diagnosis not present

## 2020-07-23 DIAGNOSIS — M1990 Unspecified osteoarthritis, unspecified site: Secondary | ICD-10-CM | POA: Diagnosis present

## 2020-07-23 DIAGNOSIS — G9340 Encephalopathy, unspecified: Secondary | ICD-10-CM

## 2020-07-23 DIAGNOSIS — Z8249 Family history of ischemic heart disease and other diseases of the circulatory system: Secondary | ICD-10-CM | POA: Diagnosis not present

## 2020-07-23 DIAGNOSIS — I69354 Hemiplegia and hemiparesis following cerebral infarction affecting left non-dominant side: Secondary | ICD-10-CM | POA: Diagnosis not present

## 2020-07-23 LAB — BASIC METABOLIC PANEL
Anion gap: 14 (ref 5–15)
Anion gap: 11 (ref 5–15)
Anion gap: 10 (ref 5–15)
Anion gap: 11 (ref 5–15)
BUN: 67 mg/dL — ABNORMAL HIGH (ref 8–23)
BUN: 63 mg/dL — ABNORMAL HIGH (ref 8–23)
BUN: 58 mg/dL — ABNORMAL HIGH (ref 8–23)
BUN: 46 mg/dL — ABNORMAL HIGH (ref 8–23)
CO2: 29 mmol/L (ref 22–32)
CO2: 30 mmol/L (ref 22–32)
CO2: 31 mmol/L (ref 22–32)
CO2: 30 mmol/L (ref 22–32)
Calcium: 9 mg/dL (ref 8.9–10.3)
Calcium: 8.8 mg/dL — ABNORMAL LOW (ref 8.9–10.3)
Calcium: 8.6 mg/dL — ABNORMAL LOW (ref 8.9–10.3)
Calcium: 8.5 mg/dL — ABNORMAL LOW (ref 8.9–10.3)
Chloride: 119 mmol/L — ABNORMAL HIGH (ref 98–111)
Chloride: 119 mmol/L — ABNORMAL HIGH (ref 98–111)
Chloride: 117 mmol/L — ABNORMAL HIGH (ref 98–111)
Chloride: 112 mmol/L — ABNORMAL HIGH (ref 98–111)
Creatinine, Ser: 1.32 mg/dL — ABNORMAL HIGH (ref 0.44–1.00)
Creatinine, Ser: 1.2 mg/dL — ABNORMAL HIGH (ref 0.44–1.00)
Creatinine, Ser: 1.07 mg/dL — ABNORMAL HIGH (ref 0.44–1.00)
Creatinine, Ser: 0.97 mg/dL (ref 0.44–1.00)
GFR, Estimated: 40 mL/min — ABNORMAL LOW (ref >60)
GFR, Estimated: 45 mL/min — ABNORMAL LOW (ref >60)
GFR, Estimated: 52 mL/min — ABNORMAL LOW (ref >60)
GFR, Estimated: 58 mL/min — ABNORMAL LOW (ref >60)
Glucose, Bld: 303 mg/dL — ABNORMAL HIGH (ref 70–99)
Glucose, Bld: 219 mg/dL — ABNORMAL HIGH (ref 70–99)
Glucose, Bld: 142 mg/dL — ABNORMAL HIGH (ref 70–99)
Glucose, Bld: 151 mg/dL — ABNORMAL HIGH (ref 70–99)
Potassium: 4.3 mmol/L (ref 3.5–5.1)
Potassium: 3.6 mmol/L (ref 3.5–5.1)
Potassium: 3.8 mmol/L (ref 3.5–5.1)
Potassium: 3.5 mmol/L (ref 3.5–5.1)
Sodium: 162 mmol/L (ref 135–145)
Sodium: 160 mmol/L — ABNORMAL HIGH (ref 135–145)
Sodium: 158 mmol/L — ABNORMAL HIGH (ref 135–145)
Sodium: 153 mmol/L — ABNORMAL HIGH (ref 135–145)

## 2020-07-23 LAB — CBG MONITORING, ED
Glucose-Capillary: 255 mg/dL — ABNORMAL HIGH (ref 70–99)
Glucose-Capillary: 203 mg/dL — ABNORMAL HIGH (ref 70–99)
Glucose-Capillary: 154 mg/dL — ABNORMAL HIGH (ref 70–99)
Glucose-Capillary: 127 mg/dL — ABNORMAL HIGH (ref 70–99)
Glucose-Capillary: 125 mg/dL — ABNORMAL HIGH (ref 70–99)
Glucose-Capillary: 131 mg/dL — ABNORMAL HIGH (ref 70–99)
Glucose-Capillary: 126 mg/dL — ABNORMAL HIGH (ref 70–99)
Glucose-Capillary: 126 mg/dL — ABNORMAL HIGH (ref 70–99)
Glucose-Capillary: 137 mg/dL — ABNORMAL HIGH (ref 70–99)
Glucose-Capillary: 128 mg/dL — ABNORMAL HIGH (ref 70–99)
Glucose-Capillary: 134 mg/dL — ABNORMAL HIGH (ref 70–99)
Glucose-Capillary: 150 mg/dL — ABNORMAL HIGH (ref 70–99)

## 2020-07-23 LAB — URINALYSIS, ROUTINE W REFLEX MICROSCOPIC
Bilirubin Urine: NEGATIVE
Glucose, UA: NEGATIVE mg/dL
Ketones, ur: NEGATIVE mg/dL
Nitrite: NEGATIVE
Protein, ur: 30 mg/dL — AB
Specific Gravity, Urine: 1.014 (ref 1.005–1.030)
WBC, UA: >50 WBC/hpf — ABNORMAL HIGH (ref 0–5)
pH: 5 (ref 5.0–8.0)

## 2020-07-23 LAB — GLUCOSE, CAPILLARY
Glucose-Capillary: 132 mg/dL — ABNORMAL HIGH (ref 70–99)
Glucose-Capillary: 133 mg/dL — ABNORMAL HIGH (ref 70–99)
Glucose-Capillary: 135 mg/dL — ABNORMAL HIGH (ref 70–99)
Glucose-Capillary: 135 mg/dL — ABNORMAL HIGH (ref 70–99)
Glucose-Capillary: 139 mg/dL — ABNORMAL HIGH (ref 70–99)
Glucose-Capillary: 140 mg/dL — ABNORMAL HIGH (ref 70–99)
Glucose-Capillary: 150 mg/dL — ABNORMAL HIGH (ref 70–99)

## 2020-07-23 LAB — LACTIC ACID, PLASMA
Lactic Acid, Venous: 2.7 mmol/L (ref 0.5–1.9)
Lactic Acid, Venous: 1.6 mmol/L (ref 0.5–1.9)

## 2020-07-23 LAB — OSMOLALITY: Osmolality: 376 mOsm/kg (ref 275–295)

## 2020-07-23 LAB — HEMOGLOBIN A1C
Hgb A1c MFr Bld: 9 % — ABNORMAL HIGH (ref 4.8–5.6)
Mean Plasma Glucose: 211.6 mg/dL

## 2020-07-23 LAB — RESP PANEL BY RT-PCR (FLU A&B, COVID) ARPGX2
Influenza A by PCR: NEGATIVE
Influenza B by PCR: NEGATIVE
SARS Coronavirus 2 by RT PCR: NEGATIVE

## 2020-07-23 MED ORDER — DEXTROSE 50 % IV SOLN: 0.0000 mL | INTRAVENOUS | Status: DC | PRN

## 2020-07-23 MED ORDER — HEPARIN SODIUM (PORCINE) 5000 UNIT/ML IJ SOLN
5000.0000 [IU] | Freq: Three times a day (TID) | INTRAMUSCULAR | Status: DC
  Administered 2020-07-23 – 2020-07-26 (×9): 5000 [IU] via SUBCUTANEOUS
  Filled 2020-07-23 (×9): qty 1

## 2020-07-23 MED ORDER — DEXTROSE 5 % IV SOLN: INTRAVENOUS | Status: DC

## 2020-07-23 MED ORDER — LACTATED RINGERS IV BOLUS: 500.0000 mL | Freq: Once | INTRAVENOUS | Status: DC

## 2020-07-23 MED ORDER — SODIUM CHLORIDE 0.9 % IV SOLN
1.0000 g | INTRAVENOUS | Status: DC
  Administered 2020-07-23 – 2020-07-27 (×5): 1 g via INTRAVENOUS
  Filled 2020-07-23 (×5): qty 10

## 2020-07-23 MED ORDER — CHLORHEXIDINE GLUCONATE 0.12 % MT SOLN
15.0000 mL | Freq: Two times a day (BID) | OROMUCOSAL | Status: DC
  Administered 2020-07-23 – 2020-07-27 (×8): 15 mL via OROMUCOSAL
  Filled 2020-07-23 (×6): qty 15

## 2020-07-23 MED ORDER — INSULIN REGULAR(HUMAN) IN NACL 100-0.9 UT/100ML-% IV SOLN
INTRAVENOUS | Status: DC
  Administered 2020-07-23: 9.5 [IU]/h via INTRAVENOUS
  Filled 2020-07-23: qty 100

## 2020-07-23 MED ORDER — LACTATED RINGERS IV BOLUS
20.0000 mL/kg | Freq: Once | INTRAVENOUS | Status: AC
  Administered 2020-07-23: 760 mL via INTRAVENOUS

## 2020-07-23 MED ORDER — LACTATED RINGERS IV BOLUS
1000.0000 mL | Freq: Once | INTRAVENOUS | Status: AC
  Administered 2020-07-23: 1000 mL via INTRAVENOUS

## 2020-07-23 MED ORDER — ORAL CARE MOUTH RINSE
15.0000 mL | Freq: Two times a day (BID) | OROMUCOSAL | Status: DC
  Administered 2020-07-24 – 2020-07-27 (×7): 15 mL via OROMUCOSAL

## 2020-07-23 NOTE — Progress Notes (Signed)
Notified Dr. Tonie Griffith that pt's CBG's are in the 130's and anion gap is 11. Pt has D5@100cc /hr infusing. Dr. Tonie Griffith stated to continue to endotool and insulin drip through out the night due to pt being on the D5@100cc /hr for hypernatremia. Will continue to monitor pt. Racheal Patches, RN

## 2020-07-23 NOTE — Plan of Care (Signed)
  Problem: Clinical Measurements: Goal: Ability to maintain clinical measurements within normal limits will improve Outcome: Progressing Goal: Will remain free from infection Outcome: Progressing Goal: Respiratory complications will improve Outcome: Progressing   Problem: Nutrition: Goal: Adequate nutrition will be maintained Outcome: Not Progressing

## 2020-07-23 NOTE — Progress Notes (Signed)
PROGRESS NOTE  Carrie Smith  DOB: 1936-03-29  PCP: Garwin Brothers, MD SHF:[id number]  DOA: 07/22/2020  LOS: 0 days   Chief Complaint  Patient presents with  . Hyperglycemia   Brief narrative: Carrie Smith is a 85 y.o. female with PMH significant for dementia, stroke, COPD who is a resident at [facility] in [place]. Per history, at baseline patient has left hemiplegia and nonverbal but normally alert, pleasant, responsive and able to feed herself. 2/22, around noon, staff noted her to be unresponsive, listless, staring off and not making eye contact. Blood sugar level was noted to be elevated to 560s, sodium level elevated to 161. EMS noted her to be nonverbal and brought her to the ED.  In the ED, patient was afebrile, BP in low normal range. Initial labs showed sodium level elevated to 160 serum osmolality elevated to 376, creatinine elevated 1.44, lactic acid elevated 2.7. CT head showed a sequelae of prior infarcts in the bilateral cerebellar hemispheres, bilateral basal ganglia and left parieto-occipital region.  There is a background findings of extensive microvascular angiopathy.  Admitted to hospitalist service for further evaluation management.  Subjective: Patient was seen and examined this morning. Elderly Caucasian female.  Lying down in bed.  Opens eyes on verbal command.  Nonverbal at baseline.  Unable to follow motor commands today.  Assessment/Plan: Acute hypernatremia -Sodium level is gradually improving, 158 this morning. -Currently on dextrose at 100 mill per hour.  I would continue the same for now. -Continue to monitor repeat BMP. Recent Labs  Lab 07/22/20 2103 07/23/20 0110 07/23/20 0401 07/23/20 0957  NA 160* 162* 160* 158*   Hyperosmolar diabetic nonketotic state -Initial blood sugar level 560 with serum hospital to elevated 376 -Significantly volume depleted.  Currently on dextrose drip because of hypernatremia.  On insulin drip at the same  time. -Continue the same.  Acute metabolic encephalopathy  Dementia at baseline  -Nonverbal at baseline but apparently able to follow commands, feed herself  -Currently she is completely altered.  Only able to open eyes on verbal command.   -This is likely secondary to significant elevated sodium level.   -Continue to monitor mental status change.  AKI Lactic acidosis -Unclear baseline creatinine.  Creatinine was elevated 1.44 on arrival, likely prerenal.  -Gradually improving with hydration.  Continue to monitor. Recent Labs    07/22/20 2103 07/23/20 0110 07/23/20 0401 07/23/20 0957  BUN 71* 67* 63* 58*  CREATININE 1.44* 1.32* 1.20* 1.07*   Recent Labs  Lab 07/22/20 2103 07/23/20 0110 07/23/20 0957  LATICACIDVEN 2.7* 2.7* 1.6   UTI -Empiric rocephin -UCx pending  History of PE -chronically on Eliquis.  Because of compromised oral intake, currently Eliquis is on hold. -DVT prophylaxis with Heparin [place] for now -Resume eliquis when able to take POs  Mobility: PT eval when able to participate Code Status:   Code Status: Full Code .  Per RN, patient came with a yellow DNR form from the nursing home.  I discussed with patient's son [name] this morning.  He wants to make a full code.  He states he is a retired Airline pilot and understands.  From medical standpoint, I think DNR status is appropriate.  Mr. [name] is open to discussing it again in case patient's clinical status worsens.   Nutritional status: Body mass index is 39.18 kg/m.     Diet Order            Diet NPO time specified  Diet effective now  DVT prophylaxis: heparin injection 5,000 Units Start: 07/23/20 0600 SCDs Start: 07/23/20 0028   Antimicrobials:  IV Rocephin Fluid: D5 at 100 mL/h Consultants: None Family Communication:  Discussed with patient's son Mr. [name] on the phone this morning  Status is: Inpatient  Remains inpatient appropriate because: Needs further IV hydration and  monitoring  Dispo: The patient is from: SNF              Anticipated d/c is to: SNF              Anticipated d/c date is: 3 days              Patient currently is not medically stable to d/c.   Difficult to place patient No       Infusions:  . cefTRIAXone (ROCEPHIN)  IV Stopped (07/23/20 0548)  . dextrose 100 mL/hr at 07/23/20 0215  . insulin 1.4 Units/hr (07/23/20 1103)    Scheduled Meds: . heparin  5,000 Units Subcutaneous Q8H    Antimicrobials: Anti-infectives (From admission, onward)   Start     Dose/Rate Route Frequency Ordered Stop   07/23/20 0215  cefTRIAXone (ROCEPHIN) 1 g in sodium chloride 0.9 % 100 mL IVPB        1 g 200 mL/hr over 30 Minutes Intravenous Every 24 hours 07/23/20 0205        PRN meds: dextrose   Objective: Vitals:   07/23/20 1045 07/23/20 1100  BP: (!) 112/50 (!) 112/51  Pulse: (!) 58 62  Resp: (!) 24 (!) 25  Temp:    SpO2: 100% 94%   No intake or output data in the 24 hours ending 07/23/20 1157 Filed Weights   07/22/20 2107  Weight: 88 kg   Weight change:  Body mass index is 39.18 kg/m.   Physical Exam: General exam: Pleasant elderly Caucasian female.  Nonverbal, not in physical distress Skin: No rashes, lesions or ulcers. HEENT: Atraumatic, normocephalic, no obvious bleeding Lungs: Clear to auscultation bilaterally CVS: Regular rate and rhythm, no murmur GI/Abd soft, nontender, nondistended, bowel sound present CNS: Alert, awake, unable to follow commands Psychiatry: Mood appropriate Extremities: No pedal edema, no calf tenderness  Data Review: I have personally reviewed the laboratory data and studies available.  Recent Labs  Lab 07/22/20 2103  WBC 8.5  NEUTROABS 5.8  HGB 14.0  HCT 46.1*  MCV 99.4  PLT 130*   Recent Labs  Lab 07/22/20 2103 07/23/20 0110 07/23/20 0401 07/23/20 0957  NA 160* 162* 160* 158*  K 4.4 4.3 3.6 3.8  CL 117* 119* 119* 117*  CO2 30 29 30 31   GLUCOSE 392* 303* 219* 142*  BUN 71*  67* 63* 58*  CREATININE 1.44* 1.32* 1.20* 1.07*  CALCIUM 8.8* 9.0 8.8* 8.6*    F/u labs ordered Unresulted Labs (From admission, onward)          Start     Ordered   07/24/20 2585  Basic metabolic panel  Daily,   R      07/23/20 0833   07/24/20 0500  CBC with Differential/Platelet  Daily,   R      07/23/20 0833   07/23/20 0205  Culture, Urine  Once,   STAT        07/23/20 0205   07/23/20 2778  Basic metabolic panel  Now then every 4 hours,   R (with STAT occurrences)      07/23/20 0028          Signed, Terrilee Croak, MD  [facility] 07/23/2020

## 2020-07-23 NOTE — ED Notes (Signed)
Son -- [name]-- [phone] Daughter -- Cyril Mourning - [phone]

## 2020-07-23 NOTE — H&P (Signed)
History and Physical    [name] JJH:[id number] DOB: 1936-03-01 DOA: 07/22/2020  PCP: Garwin Brothers, MD  Patient coming from: SNF  I have personally briefly reviewed patient's old medical records in [place]  Chief Complaint: AMS  HPI: Carrie Smith is a 85 y.o. female with medical history significant of dementia, stroke, COPD.  Sounds like at baseline pt is nonverbal, normally alert, pleasant, responsive, and will take meals.  Apparently has L hemiplegia at baseline as well.  Today however around noon they noticed change in mental status that they report is acute.  She seemed listless, was not making eye top contact, staring off.   They drew labs at her facility which showed sodium of 161 and BGL of 560!  They reportedly gave IM lantus, and rocephin.  Ultimately EMS called and pt brought in to ED.   ED Course: BGL 392, Sodium 160, creat 1.44 BUN 71.  Lactate 2.7.  Serum Osm 376!  CT head - some progression of prior strokes but most of what they are seeing on CT has remote or age indeterminate appearance.  Also ? A degree of NPH.   Review of Systems: Unable to perform due to AMS.  Past Medical History:  Diagnosis Date  . Arthritis   . Back pain   . Celiac disease    a. versus ischemic colitis versus microscopic colitis per chart.  Marland Kitchen COPD (chronic obstructive pulmonary disease) ([employer])   . Dementia ([place])   . GI bleed    a. remote GIB in setting of NSAIDs for arthritis.  . Hypercholesterolemia   . Hyperlipidemia   . Obesity   . Osteoporosis   . Stroke [facility])     Past Surgical History:  Procedure Laterality Date  . BACK SURGERY    . CHOLECYSTECTOMY       reports that she has quit smoking. She quit after 25.00 years of use. She has never used smokeless tobacco. She reports that she does not drink alcohol and does not use drugs.  Allergies  Allergen Reactions  . Ampicillin   . Codeine   . Sulbactam     Family History  Problem Relation Age of  Onset  . Heart disease Sister        heart disease, ?blood clot  . Diabetes Mother   . Heart disease Mother   . Heart attack Father        heart attack, sudden massive     Prior to Admission medications   Medication Sig Start Date End Date Taking? Authorizing Provider  carvedilol (COREG) 3.125 MG tablet Take 3.125 mg by mouth 2 (two) times daily. Hold if  SBP <110 or HR  <60 07/11/20  Yes [provider]  ELIQUIS 5 MG TABS tablet Take 2.5 mg by mouth 2 (two) times daily. 02/04/16  Yes [provider]  guaifenesin (ROBITUSSIN) 100 MG/5ML syrup Take 400 mg by mouth every 12 (twelve) hours as needed for cough.   Yes [provider]  insulin aspart (NOVOLOG) 100 UNIT/ML injection Inject 2-10 Units into the skin See admin instructions. Per sliding scale twice daily- 151-200= 2 units, 201-250= 4 units, 251-300 = 6 units, 301-350= 8 units, 351-400= 10 units,  over 400 call md   Yes [provider]  ipratropium-albuterol (DUONEB) 0.5-2.5 (3) MG/3ML SOLN Take 3 mLs by nebulization every 2 (two) hours as needed (wheezing/shortness of breath). 05/15/20  Yes [provider]  ondansetron (ZOFRAN-ODT) 4 MG disintegrating tablet Take 4  mg by mouth every 12 (twelve) hours as needed for nausea or vomiting.   Yes [provider]  OVER THE COUNTER MEDICATION Take 120 mLs by mouth daily. House 2.0- for weight loss   Yes [provider]  OVER THE COUNTER MEDICATION Inject 60 mLs into the vein See admin instructions. NS  60 ml/hr x 1000 ml bag every shift for elevated BG x 1000 ml then d/c   Yes [provider]  OXYGEN Inhale 2 L into the lungs in the morning, at noon, and at bedtime. To keep sats >90%   Yes [provider]  potassium chloride (KLOR-CON) 20 MEQ packet Take 1 packet by mouth daily. 07/11/20  Yes [provider]  torsemide (DEMADEX) 20 MG tablet Take 30 mg by mouth daily. 07/17/20  Yes [provider]   traMADol (ULTRAM) 50 MG tablet Take by mouth every 12 (twelve) hours as needed for moderate pain.   Yes [provider]  cefTRIAXone (ROCEPHIN) 1 g injection Inject 1 g into the muscle once. Patient not taking: No sig reported    [provider]  insulin aspart (NOVOLOG FLEXPEN) 100 UNIT/ML FlexPen Inject 14 Units into the skin once. Patient not taking: No sig reported    [provider]  Insulin Glargine (LANTUS SOLOSTAR [place]) Inject 10 Units into the skin once. Patient not taking: No sig reported    [provider]    Physical Exam: Vitals:   07/22/20 2200 07/22/20 2201 07/22/20 2300 07/22/20 2330  BP: 116/63  (!) 117/58 (!) 105/52  Pulse: 85 71 70 70  Resp: (!) 40 (!) 22 (!) 31 (!) 25  Temp:      TempSrc:      SpO2: 100% 100% 95% 96%  Weight:      Height:        Constitutional: Unresponsive Eyes: PERRL, lids and conjunctivae normal ENMT: Mucous membranes are moist. Posterior pharynx clear of any exudate or lesions.Normal dentition.  Neck: normal, supple, no masses, no thyromegaly Respiratory: clear to auscultation bilaterally, no wheezing, no crackles. Normal respiratory effort. No accessory muscle use.  Cardiovascular: Regular rate and rhythm, no murmurs / rubs / gallops. No extremity edema. 2+ pedal pulses. No carotid bruits.  Abdomen: no tenderness, no masses palpated. No hepatosplenomegaly. Bowel sounds positive.  Musculoskeletal: no clubbing / cyanosis. No joint deformity upper and lower extremities. Good ROM, no contractures. Normal muscle tone.  Skin: no rashes, lesions, ulcers. No induration Neurologic: opens eyes to voice, old stroke per history with L sided hemiplegia. Psychiatric: Unresponsive   Labs on Admission: I have personally reviewed following labs and imaging studies  CBC: Recent Labs  Lab 07/22/20 2103  WBC 8.5  NEUTROABS 5.8  HGB 14.0  HCT 46.1*  MCV 99.4  PLT 470*   Basic Metabolic Panel: Recent Labs  Lab  07/22/20 2103 07/23/20 0110  NA 160* 162*  K 4.4 4.3  CL 117* 119*  CO2 30 29  GLUCOSE 392* 303*  BUN 71* 67*  CREATININE 1.44* 1.32*  CALCIUM 8.8* 9.0   GFR: Estimated Creatinine Clearance: 31.7 mL/min (A) (by C-G formula based on SCr of 1.32 mg/dL ([place])). Liver Function Tests: Recent Labs  Lab 07/22/20 2103  AST 21  ALT 21  ALKPHOS 89  BILITOT 0.5  PROT 6.0*  ALBUMIN 2.5*   No results for input(s): LIPASE, AMYLASE in the last 168 hours. No results for input(s): AMMONIA in the last 168 hours. Coagulation Profile: No results for input(s):  INR, PROTIME in the last 168 hours. Cardiac Enzymes: No results for input(s): CKTOTAL, CKMB, CKMBINDEX, TROPONINI in the last 168 hours. BNP (last 3 results) No results for input(s): PROBNP in the last 8760 hours. HbA1C: No results for input(s): HGBA1C in the last 72 hours. CBG: Recent Labs  Lab 07/22/20 2104 07/23/20 0153  GLUCAP 340* 255*   Lipid Profile: No results for input(s): CHOL, HDL, LDLCALC, TRIG, CHOLHDL, LDLDIRECT in the last 72 hours. Thyroid Function Tests: No results for input(s): TSH, T4TOTAL, FREET4, T3FREE, THYROIDAB in the last 72 hours. Anemia Panel: No results for input(s): VITAMINB12, FOLATE, FERRITIN, TIBC, IRON, RETICCTPCT in the last 72 hours. Urine analysis:    Component Value Date/Time   COLORURINE YELLOW 07/23/2020 0010   APPEARANCEUR HAZY (A) 07/23/2020 0010   LABSPEC 1.014 07/23/2020 0010   PHURINE 5.0 07/23/2020 0010   GLUCOSEU NEGATIVE 07/23/2020 0010   HGBUR MODERATE (A) 07/23/2020 0010   BILIRUBINUR NEGATIVE 07/23/2020 0010   KETONESUR NEGATIVE 07/23/2020 0010   PROTEINUR 30 (A) 07/23/2020 0010   UROBILINOGEN 0.2 04/13/2015 1630   NITRITE NEGATIVE 07/23/2020 0010   LEUKOCYTESUR LARGE (A) 07/23/2020 0010    Radiological Exams on Admission: CT Head Wo Contrast  Result Date: 07/22/2020 CLINICAL DATA:  Found unresponsive at noon EXAM: CT HEAD WITHOUT CONTRAST TECHNIQUE: Contiguous axial  images were obtained from the base of the skull through the vertex without intravenous contrast. COMPARISON:  CT 08/21/2017 FINDINGS: Brain: Sequela of prior infarcts seen in the bilateral cerebellar hemispheres, bilateral basal ganglia and left parieto-occipital region. Fall there has been some interval progression from comparison studies, many of these have a fairly remote or age indeterminate appearance. Findings on a background of extensive microvascular angiopathy and marked parenchymal volume loss. Dilatation of the ventricles is somewhat out of proportion to the sulcal prominence and could reflect a degree of normal pressure hydrocephaly. Correlate with clinical findings. No extra-axial collection, mass or mass effect. Vascular: Atherosclerotic calcification of the carotid siphons and intradural vertebral arteries. No hyperdense vessel. Skull: No calvarial fracture or suspicious osseous lesion. No scalp swelling or hematoma. Sinuses/Orbits: Mural thickening in the ethmoids and left sphenoid sinus. Partial opacification of the right mastoid air cells. Included orbital structures are unremarkable. Other: Edentulous. IMPRESSION: 1. Sequela of prior infarcts in the bilateral cerebellar hemispheres, bilateral basal ganglia and left parieto-occipital region. There has been some interval progression from comparison studies, though many of these have a fairly remote or age indeterminate appearance. If there is persisting concern for acute infarction, MRI is more sensitive and specific for early features of ischemia. 2. Findings on a background of extensive microvascular angiopathy and marked parenchymal volume loss. 3. Dilatation of the ventricles is somewhat out of proportion to the sulcal prominence and could reflect a degree of normal pressure hydrocephaly. Correlate with clinical findings. 4. Intracranial atherosclerosis. Electronically Signed   By: Lovena Le M.D.   On: 07/22/2020 22:08   DG Chest Portable 1  View  Result Date: 07/22/2020 CLINICAL DATA:  Altered mental status EXAM: PORTABLE CHEST 1 VIEW COMPARISON:  CT 08/10/2019, radiograph 06/18/2018 FINDINGS: Diminished lung volumes from comparison portable with some likely atelectatic changes. Increasing hazy reticular opacities in the lungs, could be atelectatic or reflect some edematous change. More bandlike opacity in the right lung base with elevation of the right hemidiaphragm suggestive of some volume loss. More focal opacity in the left lung base/retrocardiac space with obscuration of the left hemidiaphragm, could reflect further volume loss, airspace disease or layering  effusion. Grossly stable cardiomediastinal contours with a calcified aorta. The osseous structures appear diffusely demineralized which may limit detection of small or nondisplaced fractures. No acute osseous abnormality or suspicious osseous lesion. Degenerative changes are present in the imaged spine and shoulders. Telemetry leads overlie the chest. Edematous changes of the body wall. IMPRESSION: 1. Increasing hazy reticular opacities in the lungs, could be atelectatic or edematous change. 2. More focal opacity in the left lung base/retrocardiac space with obscuration of the left hemidiaphragm, could reflect further volume loss, airspace disease, or layering effusion. 3.  Aortic Atherosclerosis (ICD10-I70.0). Electronically Signed   By: Lovena Le M.D.   On: 07/22/2020 22:10    EKG: Independently reviewed.  Assessment/Plan Principal Problem:   Acute encephalopathy Active Problems:   AKI (acute kidney injury) ([place])   Hypernatremia   Acute lower UTI   HHNC (hyperglycemic hyperosmolar nonketotic coma) ([place])   Dementia with behavioral disturbance ([place])    1. Acute encephalopathy - 1. Due to hyperosmolar state / dehydration / hypernatremia. 2. Hyperglycemic crisis pathway 3. IVF: 1760 ml LR bolus, then D5W at 100cc/hr 4. Insulin gtt 5. BMP Q4H 6. Holding home  torsemide 2. UTI - 1. Empiric rocephin 2. UCx pending 3. AKI - 1. Likely pre-renal / dehydration 2. IVF as above 3. Strict intake and output 4. Follow serial BMPs 4. Dementia - 1. Sounds fairly advanced from history taking at baseline 2. ? If todays dehydration admit represents adult failure to thrive? 3. May want to get pal care involved in AM 5. History of PE - 1. On chronic eliquis at baseline 2. Heparin [place] for now 3. Resume eliquis when able to take POs  DVT prophylaxis: Heparin [place] Resume eliquis when able to take POs Code Status: DNR Family Communication: No family in room Disposition Plan: SNF after admit Consults called: None Admission status: Admit to inpatient  Severity of Illness: The appropriate patient status for this patient is INPATIENT. Inpatient status is judged to be reasonable and necessary in order to provide the required intensity of service to ensure the patient's safety. The patient's presenting symptoms, physical exam findings, and initial radiographic and laboratory data in the context of their chronic comorbidities is felt to place them at high risk for further clinical deterioration. Furthermore, it is not anticipated that the patient will be medically stable for discharge from the hospital within 2 midnights of admission. The following factors support the patient status of inpatient.   IP status due to: 1) acute encephalopathy due to [facility] with sodium of 160! 2) AKI   * I certify that at the point of admission it is my clinical judgment that the patient will require inpatient hospital care spanning beyond 2 midnights from the point of admission due to high intensity of service, high risk for further deterioration and high frequency of surveillance required.*    GARDNER, JARED M. DO [facility]  How to contact the [facility] Attending or Consulting provider [place] or covering provider during after hours [place], for this patient?  1. Check the care  team in [facility] and look for a) attending/consulting [facility] provider listed and b) the [facility] team listed 2. Log into [email or web address]  Amion Physician Scheduling and messaging for groups and whole hospitals  On call and physician scheduling software for group practices, residents, hospitalists and other medical providers for call, clinic, rotation and shift schedules. OnCall Enterprise is a hospital-wide system for scheduling doctors and paging doctors on call. EasyPlot is for scientific  plotting and data analysis.  [email or web address]  and use [place]'s universal password to access. If you do not have the password, please contact the hospital operator.  3. Locate the [facility] provider you are looking for under [facility] and page to a number that you can be directly reached. 4. If you still have difficulty reaching the provider, please page the [facility] (Director on Call) for the Hospitalists listed on amion for assistance.  07/23/2020, 2:11 AM

## 2020-07-23 NOTE — ED Notes (Signed)
Pt is awake, will look at staff, follows with eyes, nonverbal.

## 2020-07-23 NOTE — Progress Notes (Signed)
Lab [employer] unable to draw blood. She will send another tech to try.

## 2020-07-24 LAB — CBC WITH DIFFERENTIAL/PLATELET
Abs Immature Granulocytes: 0.04 10*3/uL (ref 0.00–0.07)
Basophils Absolute: 0 10*3/uL (ref 0.0–0.1)
Basophils Relative: 0 %
Eosinophils Absolute: 0.1 10*3/uL (ref 0.0–0.5)
Eosinophils Relative: 1 %
HCT: 41.6 % (ref 36.0–46.0)
Hemoglobin: 13 g/dL (ref 12.0–15.0)
Immature Granulocytes: 1 %
Lymphocytes Relative: 32 %
Lymphs Abs: 2.3 10*3/uL (ref 0.7–4.0)
MCH: 30.4 pg (ref 26.0–34.0)
MCHC: 31.3 g/dL (ref 30.0–36.0)
MCV: 97.2 fL (ref 80.0–100.0)
Monocytes Absolute: 0.4 10*3/uL (ref 0.1–1.0)
Monocytes Relative: 6 %
Neutro Abs: 4.3 10*3/uL (ref 1.7–7.7)
Neutrophils Relative %: 60 %
Platelets: 109 10*3/uL — ABNORMAL LOW (ref 150–400)
RBC: 4.28 MIL/uL (ref 3.87–5.11)
RDW: 17.3 % — ABNORMAL HIGH (ref 11.5–15.5)
WBC: 7.2 10*3/uL (ref 4.0–10.5)
nRBC: 0 % (ref 0.0–0.2)

## 2020-07-24 LAB — GLUCOSE, CAPILLARY
Glucose-Capillary: 142 mg/dL — ABNORMAL HIGH (ref 70–99)
Glucose-Capillary: 145 mg/dL — ABNORMAL HIGH (ref 70–99)
Glucose-Capillary: 148 mg/dL — ABNORMAL HIGH (ref 70–99)
Glucose-Capillary: 149 mg/dL — ABNORMAL HIGH (ref 70–99)
Glucose-Capillary: 150 mg/dL — ABNORMAL HIGH (ref 70–99)
Glucose-Capillary: 153 mg/dL — ABNORMAL HIGH (ref 70–99)
Glucose-Capillary: 154 mg/dL — ABNORMAL HIGH (ref 70–99)
Glucose-Capillary: 178 mg/dL — ABNORMAL HIGH (ref 70–99)
Glucose-Capillary: 202 mg/dL — ABNORMAL HIGH (ref 70–99)

## 2020-07-24 LAB — BASIC METABOLIC PANEL
Anion gap: 11 (ref 5–15)
BUN: 42 mg/dL — ABNORMAL HIGH (ref 8–23)
CO2: 29 mmol/L (ref 22–32)
Calcium: 8.5 mg/dL — ABNORMAL LOW (ref 8.9–10.3)
Chloride: 113 mmol/L — ABNORMAL HIGH (ref 98–111)
Creatinine, Ser: 0.92 mg/dL (ref 0.44–1.00)
GFR, Estimated: >60 mL/min (ref >60)
Glucose, Bld: 169 mg/dL — ABNORMAL HIGH (ref 70–99)
Potassium: 3.7 mmol/L (ref 3.5–5.1)
Sodium: 153 mmol/L — ABNORMAL HIGH (ref 135–145)

## 2020-07-24 LAB — URINE CULTURE: Culture: <10000 — AB

## 2020-07-24 LAB — MRSA PCR SCREENING: MRSA by PCR: POSITIVE — AB

## 2020-07-24 MED ORDER — MUPIROCIN 2 % EX OINT
1.0000 "application " | TOPICAL_OINTMENT | Freq: Two times a day (BID) | CUTANEOUS | Status: DC
  Administered 2020-07-24 – 2020-07-27 (×7): 1 via NASAL
  Filled 2020-07-24 (×2): qty 22

## 2020-07-24 MED ORDER — CHLORHEXIDINE GLUCONATE CLOTH 2 % EX PADS
6.0000 | MEDICATED_PAD | Freq: Every day | CUTANEOUS | Status: DC
  Administered 2020-07-24 – 2020-07-27 (×4): 6 via TOPICAL

## 2020-07-24 MED ORDER — INSULIN GLARGINE 100 UNIT/ML ~~LOC~~ SOLN
10.0000 [IU] | Freq: Every day | SUBCUTANEOUS | Status: DC
  Administered 2020-07-24 – 2020-07-25 (×2): 10 [IU] via SUBCUTANEOUS
  Filled 2020-07-24 (×5): qty 0.1

## 2020-07-24 NOTE — NC FL2 (Signed)
[place] LEVEL OF CARE SCREENING TOOL     IDENTIFICATION  Patient Name: Carrie Smith Birthdate: 10/24/1935 Sex: female Admission Date (Current Location): 07/22/2020  [facility] and [place] Number:  Herbalist and Address:  [facility], [place] [address] [place], [place] [address]      Provider Number: [id number]  Attending Physician Name and Address:  Terrilee Croak, MD  Relative Name and Phone Number:  [name] (son) [phone]    Current Level of Care: Hospital Recommended Level of Care: [place] Prior Approval Number:    Date Approved/Denied:   PASRR Number:    Discharge Plan: SNF    Current Diagnoses: Patient Active Problem List   Diagnosis Date Noted  . Hypernatremia 07/23/2020  . Acute lower UTI 07/23/2020  . HHNC (hyperglycemic hyperosmolar nonketotic coma) ([place]) 07/23/2020  . Dementia with behavioral disturbance ([place]) 07/23/2020  . Abnormality of gait 02/18/2016  . Mild cognitive impairment 02/18/2016  . Pressure ulcer 04/14/2015  . Obesity 04/14/2015  . Acute encephalopathy 04/14/2015  . AKI (acute kidney injury) ([place]) 04/14/2015  . Hyperkalemia 04/14/2015  . Hypokalemia 04/14/2015  . Pulmonary embolism ([place]) 04/13/2015  . Elevated troponin 04/13/2015  . Elevated transaminase level 04/13/2015  . Rhabdomyolysis 04/13/2015  . GERD 06/25/2008  . Celiac disease 06/25/2008  . HYPERCHOLESTEROLEMIA 06/24/2008  . EXTERNAL HEMORRHOIDS 06/24/2008  . LOW BACK PAIN, CHRONIC 06/24/2008  . Osteoporosis 06/24/2008  . OTHER SPEC GASTRITIS WITHOUT MENTION HEMORRHAGE 09/06/2007  . ISCHEMIC COLITIS 09/06/2007  . DIVERTICULOSIS, COLON 09/06/2007    Orientation RESPIRATION BLADDER Height & Weight      (Impaired Speech)  O2 (3L Nasal Cannal) Incontinent,External catheter Weight: 177 lb 4 oz (80.4 kg) Height:  4\' 11"  (149.9 cm)  BEHAVIORAL SYMPTOMS/MOOD NEUROLOGICAL BOWEL NUTRITION STATUS      Continent  Diet (See DC Summary)  AMBULATORY STATUS COMMUNICATION OF NEEDS Skin   Extensive Assist Does not communicate                         Personal Care Assistance Level of Assistance  Bathing,Feeding,Dressing Bathing Assistance: Maximum assistance Feeding assistance: Maximum assistance Dressing Assistance: Maximum assistance     Functional Limitations Info  Sight,Hearing,Speech Sight Info: Adequate Hearing Info: Adequate Speech Info: Impaired ([place])    SPECIAL CARE FACTORS FREQUENCY  PT (By licensed PT),OT (By licensed OT)     PT Frequency: 5x per week OT Frequency: 5x per week            Contractures Contractures Info: Not present    Additional Factors Info  Code Status,Allergies,Insulin Sliding Scale Code Status Info: DNR Allergies Info: Ampicillin Codeine Sulbactam   Insulin Sliding Scale Info: See DC Summary       Current Medications (07/24/2020):  This is the current hospital active medication list Current Facility-Administered Medications  Medication Dose Route Frequency Provider Last Rate Last Admin  . cefTRIAXone (ROCEPHIN) 1 g in sodium chloride 0.9 % 100 mL IVPB  1 g Intravenous Q24H Jennette Kettle M, DO 200 mL/hr at 07/24/20 0218 1 g at 07/24/20 0218  . chlorhexidine (PERIDEX) 0.12 % solution 15 mL  15 mL Mouth Rinse BID Jennette Kettle M, DO   15 mL at 07/24/20 2841  . Chlorhexidine Gluconate Cloth 2 % PADS 6 each  6 each Topical Q0600 Etta Quill, DO   6 each at 07/24/20 [phone]  . dextrose 5 % solution   Intravenous Continuous Dahal,  Marlowe Aschoff, MD 50 mL/hr at 07/24/20 0929 Rate Change at 07/24/20 0929  . dextrose 50 % solution 0-50 mL  0-50 mL Intravenous PRN Etta Quill, DO      . heparin injection 5,000 Units  5,000 Units Subcutaneous Q8H Jennette Kettle M, DO   5,000 Units at 07/24/20 1501  . insulin glargine (LANTUS) injection 10 Units  10 Units Subcutaneous Daily Dahal, Marlowe Aschoff, MD   10 Units at 07/24/20 1117  . MEDLINE mouth rinse  15 mL Mouth  Rinse q12n4p Etta Quill, DO   15 mL at 07/24/20 1238  . mupirocin ointment (BACTROBAN) 2 % 1 application  1 application Nasal BID Etta Quill, DO   1 application at 76/28/31 5176     Discharge Medications: Please see discharge summary for a list of discharge medications.  Relevant Imaging Results:  Relevant Lab Results:   Additional Information SSN [id number]  Reece Agar, [place]

## 2020-07-24 NOTE — Progress Notes (Signed)
The patient's daughter called and wanted an update on the patient's status. I, unfortunately, had to inform the daughter that I could not give her an update and that she would have to speak with one of her brothers that are listed on the patient's Advance Directive to receive an update. The patient's daughter was very upset and stated, with a tearful voice "that's my mother." I verbalized understanding and sympathized with the patient's daughter but reiterated to her that I could not share any information about the patient at this time. She verbalized understanding.

## 2020-07-24 NOTE — Progress Notes (Signed)
I called the patient's son, [name], who is her emergency contact and one of her POAs. [name] established a password for the patient ([place]) and gave me permission to give the password to the patient's daughter. I also updated the patient's son regarding the patient's status (labs and assessment). [name] verbalized understanding and thanked me for the phone call.

## 2020-07-24 NOTE — Progress Notes (Signed)
PROGRESS NOTE  Carrie Smith  DOB: [birth year]  PCP: Garwin Brothers, MD OBS:[id number]  DOA: 07/22/2020  LOS: 1 day   Chief Complaint  Patient presents with  . Hyperglycemia   Brief narrative: Carrie Smith is a 85 y.o. female with PMH significant for dementia, stroke, COPD who is a resident at [facility] in [place]. Per history, at baseline patient has left hemiplegia and nonverbal but normally alert, pleasant, responsive and able to feed herself. 2/22, around noon, staff noted her to be unresponsive, listless, staring off and not making eye contact. Blood sugar level was noted to be elevated to 560s, sodium level elevated to 161. EMS noted her to be nonverbal and brought her to the ED.  In the ED, patient was afebrile, BP in low normal range. Initial labs showed sodium level elevated to 160 serum osmolality elevated to 376, creatinine elevated 1.44, lactic acid elevated 2.7. CT head showed a sequelae of prior infarcts in the bilateral cerebellar hemispheres, bilateral basal ganglia and left parieto-occipital region.  There is a background findings of extensive microvascular angiopathy.  Admitted to hospitalist service for further evaluation management.  Subjective: Patient was seen and examined this morning. Lying down in bed. Tries to open eyes on verbal command. Unable to follow any command. This labs this morning with sodium level improving 153. Discussed with son later this morning on the phone  Assessment/Plan: Acute hypernatremia -Sodium level is gradually improving, 153 this morning. -Currently on dextrose at 100 mill per hour. I would reduce it to 50 mill per hour.  -Continue to monitor BMP. Recent Labs  Lab 07/22/20 2103 07/23/20 0110 07/23/20 0401 07/23/20 0957 07/23/20 1951 07/24/20 0111  NA 160* 162* 160* 158* 153* 153*   Hyperosmolar diabetic nonketotic state uncontrolled type 2 diabetes mellitus -Initial blood sugar level 560 with serum hospital to  elevated 376, A1c 9 on 2/23. -Significantly volume depleted.  Currently on D5 at 100 mill per hour along with insulin drip. Will reduce dextrose drip to 50 mill per hour. Stop insulin drip. Start on Lantus 10 units this morning. Continue to monitor fingerstick.  -It is unclear at this time if patient was taking any long-acting insulin prior to this admission. Recent Labs  Lab 07/24/20 0144 07/24/20 0357 07/24/20 0608 07/24/20 0726 07/24/20 0937  GLUCAP 145* 142* 154* 148* 476*   Acute metabolic encephalopathy  Dementia at baseline  -Nonverbal at baseline but apparently able to follow commands, feed herself  -Currently she is completely altered.  Only able to open eyes on verbal command.   -This is likely secondary to significant elevated sodium level.   -Continue to monitor mental status change.  AKI Lactic acidosis -Unclear baseline creatinine.  Creatinine was elevated 1.44 on arrival, likely prerenal.  -Gradually improving with hydration.  Continue to monitor. -Lactic acid level normalized as well. Recent Labs    07/22/20 2103 07/23/20 0110 07/23/20 0401 07/23/20 0957 07/23/20 1951 07/24/20 0111  BUN 71* 67* 63* 58* 46* 42*  CREATININE 1.44* 1.32* 1.20* 1.07* 0.97 0.92   Recent Labs  Lab 07/22/20 2103 07/23/20 0110 07/23/20 0957  LATICACIDVEN 2.7* 2.7* 1.6   UTI -Empiric rocephin -UCx pending  History of PE -chronically on Eliquis.  Because of compromised oral intake, currently Eliquis is on hold. -DVT prophylaxis with Heparin [place] for now -Resume eliquis when able to take POs  Mobility: PT eval when able to participate Code Status:   Code Status: DNR . I discussed patient's CODE STATUS with  her son Mr. [name] again this morning. He has discussed this with his siblings. All agree on DNR at this time. CODE STATUS updated in the chart. Nutritional status: Body mass index is 35.8 kg/m.     Diet Order            Diet NPO time specified  Diet effective now                  DVT prophylaxis: heparin injection 5,000 Units Start: 07/23/20 0600 SCDs Start: 07/23/20 0028   Antimicrobials:  IV Rocephin Fluid: D5 at 34 mill per hour Consultants: None Family Communication:  Discussed with patient's son Mr. [name] on the phone on 2/23 and 2/24  Status is: Inpatient  Remains inpatient appropriate because: Needs further IV hydration and monitoring  Dispo: The patient is from: SNF              Anticipated d/c is to: SNF              Anticipated d/c date is: 3 days              Patient currently is not medically stable to d/c.   Difficult to place patient No  Infusions:  . cefTRIAXone (ROCEPHIN)  IV 1 g (07/24/20 0218)  . dextrose 50 mL/hr at 07/24/20 2831    Scheduled Meds: . chlorhexidine  15 mL Mouth Rinse BID  . Chlorhexidine Gluconate Cloth  6 each Topical Q0600  . heparin  5,000 Units Subcutaneous Q8H  . insulin glargine  10 Units Subcutaneous Daily  . mouth rinse  15 mL Mouth Rinse q12n4p  . mupirocin ointment  1 application Nasal BID    Antimicrobials: Anti-infectives (From admission, onward)   Start     Dose/Rate Route Frequency Ordered Stop   07/23/20 0215  cefTRIAXone (ROCEPHIN) 1 g in sodium chloride 0.9 % 100 mL IVPB        1 g 200 mL/hr over 30 Minutes Intravenous Every 24 hours 07/23/20 0205        PRN meds: dextrose   Objective: Vitals:   07/24/20 0000 07/24/20 0545  BP: (!) 114/55 (!) 127/55  Pulse: 62 62  Resp: 20 (!) 23  Temp: 98 F (36.7 C) 98.3 F (36.8 C)  SpO2: 94% 98%    Intake/Output Summary (Last 24 hours) at 07/24/2020 1117 Last data filed at 07/24/2020 0617 Gross per 24 hour  Intake 2911.28 ml  Output 400 ml  Net 2511.28 ml   Filed Weights   07/22/20 2107 07/23/20 1645  Weight: 88 kg 80.4 kg   Weight change: -7.6 kg Body mass index is 35.8 kg/m.   Physical Exam: General exam: Pleasant elderly Caucasian female.  Nonverbal, not in physical distress Skin: No rashes, lesions or  ulcers. HEENT: Atraumatic, normocephalic, no obvious bleeding Lungs: Clear to auscultation bilaterally CVS: Regular rate and rhythm, no murmur GI/Abd soft, nontender, nondistended, bowel sound present CNS: Tries to open eyes on verbal command. Unable to follow other commands today Psychiatry: Unable to examine because of altered mental status Extremities: No pedal edema, no calf tenderness  Data Review: I have personally reviewed the laboratory data and studies available.  Recent Labs  Lab 07/22/20 2103 07/24/20 0111  WBC 8.5 7.2  NEUTROABS 5.8 4.3  HGB 14.0 13.0  HCT 46.1* 41.6  MCV 99.4 97.2  PLT 130* 109*   Recent Labs  Lab 07/23/20 0110 07/23/20 0401 07/23/20 0957 07/23/20 1951 07/24/20 0111  NA 162* 160* 158* 153*  153*  K 4.3 3.6 3.8 3.5 3.7  CL 119* 119* 117* 112* 113*  CO2 29 30 31 30 29   GLUCOSE 303* 219* 142* 151* 169*  BUN 67* 63* 58* 46* 42*  CREATININE 1.32* 1.20* 1.07* 0.97 0.92  CALCIUM 9.0 8.8* 8.6* 8.5* 8.5*    F/u labs ordered Unresulted Labs (From admission, onward)          Start     Ordered   07/24/20 9689  Basic metabolic panel  Daily,   R      07/23/20 0833   07/24/20 0500  CBC with Differential/Platelet  Daily,   R      07/23/20 5702          Signed, Terrilee Croak, MD [facility] 07/24/2020

## 2020-07-25 ENCOUNTER — Other Ambulatory Visit: Payer: Self-pay

## 2020-07-25 LAB — CBC WITH DIFFERENTIAL/PLATELET
Abs Immature Granulocytes: 0.04 10*3/uL (ref 0.00–0.07)
Basophils Absolute: 0 10*3/uL (ref 0.0–0.1)
Basophils Relative: 0 %
Eosinophils Absolute: 0 10*3/uL (ref 0.0–0.5)
Eosinophils Relative: 1 %
HCT: 41 % (ref 36.0–46.0)
Hemoglobin: 13.1 g/dL (ref 12.0–15.0)
Immature Granulocytes: 1 %
Lymphocytes Relative: 38 %
Lymphs Abs: 2.2 10*3/uL (ref 0.7–4.0)
MCH: 30.3 pg (ref 26.0–34.0)
MCHC: 32 g/dL (ref 30.0–36.0)
MCV: 94.9 fL (ref 80.0–100.0)
Monocytes Absolute: 0.5 10*3/uL (ref 0.1–1.0)
Monocytes Relative: 8 %
Neutro Abs: 3 10*3/uL (ref 1.7–7.7)
Neutrophils Relative %: 52 %
Platelets: 112 10*3/uL — ABNORMAL LOW (ref 150–400)
RBC: 4.32 MIL/uL (ref 3.87–5.11)
RDW: 16.8 % — ABNORMAL HIGH (ref 11.5–15.5)
WBC: 5.8 10*3/uL (ref 4.0–10.5)
nRBC: 0 % (ref 0.0–0.2)

## 2020-07-25 LAB — GLUCOSE, CAPILLARY
Glucose-Capillary: 211 mg/dL — ABNORMAL HIGH (ref 70–99)
Glucose-Capillary: 219 mg/dL — ABNORMAL HIGH (ref 70–99)
Glucose-Capillary: 179 mg/dL — ABNORMAL HIGH (ref 70–99)
Glucose-Capillary: 153 mg/dL — ABNORMAL HIGH (ref 70–99)

## 2020-07-25 LAB — BASIC METABOLIC PANEL
Anion gap: 10 (ref 5–15)
BUN: 26 mg/dL — ABNORMAL HIGH (ref 8–23)
CO2: 28 mmol/L (ref 22–32)
Calcium: 8.2 mg/dL — ABNORMAL LOW (ref 8.9–10.3)
Chloride: 108 mmol/L (ref 98–111)
Creatinine, Ser: 0.82 mg/dL (ref 0.44–1.00)
GFR, Estimated: >60 mL/min (ref >60)
Glucose, Bld: 208 mg/dL — ABNORMAL HIGH (ref 70–99)
Potassium: 4 mmol/L (ref 3.5–5.1)
Sodium: 146 mmol/L — ABNORMAL HIGH (ref 135–145)

## 2020-07-25 MED ORDER — INSULIN ASPART 100 UNIT/ML ~~LOC~~ SOLN
0.0000 [IU] | SUBCUTANEOUS | Status: DC
  Administered 2020-07-25: 3 [IU] via SUBCUTANEOUS
  Administered 2020-07-25 – 2020-07-26 (×4): 2 [IU] via SUBCUTANEOUS
  Administered 2020-07-26: 1 [IU] via SUBCUTANEOUS

## 2020-07-25 NOTE — TOC Initial Note (Signed)
Transition of Care [facility]) - Initial/Assessment Note    Patient Details  Name: Carrie Smith MRN: [id number] Date of Birth: [birth year]  Transition of Care [facility]) CM/SW Contact:    Benard Halsted, LCSW Phone Number: 07/25/2020, 2:20 PM  Clinical Narrative:                 Patient resides at [facility]. CSW confirmed with [place], [place], that patient is a long term care resident and will not require prior authorization to return. Patient is able to return over the weekend if needed. CSW will continue to follow.   Expected Discharge Plan: Skilled Nursing Facility Barriers to Discharge: Continued Medical Work up   Patient Goals and CMS Choice Patient states their goals for this hospitalization and ongoing recovery are:: Return to SNF CMS Medicare.gov Compare Post Acute Care list provided to:: Patient Represenative (must comment) Choice offered to / list presented to : Adult Children  Expected Discharge Plan and Services Expected Discharge Plan: [place] In-house Referral: Clinical Social Work   Post Acute Care Choice: [place] Living arrangements for the past 2 months: [place]                                      Prior Living Arrangements/Services Living arrangements for the past 2 months: [place] Lives with:: Facility Resident Patient language and need for interpreter reviewed:: Yes Do you feel safe going back to the place where you live?: Yes      Need for Family Participation in Patient Care: Yes (Comment) Care giver support system in place?: Yes (comment)   Criminal Activity/Legal Involvement Pertinent to Current Situation/Hospitalization: No - Comment as needed  Activities of Daily Living   ADL Screening (condition at time of admission) Patient's cognitive ability adequate to safely complete daily activities?: No Is the patient deaf or have difficulty hearing?: Yes Does the patient have difficulty  seeing, even when wearing glasses/contacts?: No Does the patient have difficulty concentrating, remembering, or making decisions?: Yes Patient able to express need for assistance with ADLs?: No Does the patient have difficulty dressing or bathing?: Yes Independently performs ADLs?: No Communication: Needs assistance Is this a change from baseline?: Pre-admission baseline Dressing (OT): Dependent Is this a change from baseline?: Pre-admission baseline Grooming: Dependent Is this a change from baseline?: Pre-admission baseline Feeding: Dependent Is this a change from baseline?: Pre-admission baseline Bathing: Dependent Is this a change from baseline?: Pre-admission baseline Toileting: Dependent Is this a change from baseline?: Pre-admission baseline In/Out Bed: Dependent Is this a change from baseline?: Pre-admission baseline Walks in Home: Dependent Is this a change from baseline?: Pre-admission baseline Does the patient have difficulty walking or climbing stairs?: Yes Weakness of Legs: Both Weakness of Arms/Hands: Both  Permission Sought/Granted Permission sought to share information with : Facility Contact Representative,Family Supports Permission granted to share information with : No  Share Information with NAME: [name]  Permission granted to share info w AGENCY: SNF  Permission granted to share info w Relationship: Son  Permission granted to share info w Contact Information: [phone]  Emotional Assessment Appearance:: Appears stated age Attitude/Demeanor/Rapport: Unable to Assess Affect (typically observed): Unable to Assess Orientation: : Oriented to Self Alcohol / Substance Use: Not Applicable Psych Involvement: No (comment)  Admission diagnosis:  Hypernatremia [E87.0] Dyspnea [R06.00] Altered mental status, unspecified altered mental status type [R41.82] Patient Active Problem List  Diagnosis Date Noted  . Hypernatremia 07/23/2020  . Acute lower UTI 07/23/2020   . HHNC (hyperglycemic hyperosmolar nonketotic coma) ([place]) 07/23/2020  . Dementia with behavioral disturbance ([place]) 07/23/2020  . Abnormality of gait 02/18/2016  . Mild cognitive impairment 02/18/2016  . Pressure ulcer 04/14/2015  . Obesity 04/14/2015  . Acute encephalopathy 04/14/2015  . AKI (acute kidney injury) ([place]) 04/14/2015  . Hyperkalemia 04/14/2015  . Hypokalemia 04/14/2015  . Pulmonary embolism ([place]) 04/13/2015  . Elevated troponin 04/13/2015  . Elevated transaminase level 04/13/2015  . Rhabdomyolysis 04/13/2015  . GERD 06/25/2008  . Celiac disease 06/25/2008  . HYPERCHOLESTEROLEMIA 06/24/2008  . EXTERNAL HEMORRHOIDS 06/24/2008  . LOW BACK PAIN, CHRONIC 06/24/2008  . Osteoporosis 06/24/2008  . OTHER SPEC GASTRITIS WITHOUT MENTION HEMORRHAGE 09/06/2007  . ISCHEMIC COLITIS 09/06/2007  . DIVERTICULOSIS, COLON 09/06/2007   PCP:  Garwin Brothers, MD Pharmacy:   [id number] - [place], [place] [address] Phone: [phone] Fax: [phone]     Social Determinants of Health (SDOH) Interventions    Readmission Risk Interventions No flowsheet data found.

## 2020-07-25 NOTE — Progress Notes (Signed)
PROGRESS NOTE  Carrie Smith  DOB: Jul 22, 1935  PCP: Garwin Brothers, MD NLZ:[id number]  DOA: 07/22/2020  LOS: 2 days   Chief Complaint  Patient presents with  . Hyperglycemia   Brief narrative: Carrie Smith is a 85 y.o. female with PMH significant for dementia, stroke, COPD who is a resident at [facility] in [place]. Per history, at baseline patient has left hemiplegia and nonverbal but normally alert, pleasant, responsive and able to feed herself. 2/22, around noon, staff noted her to be unresponsive, listless, staring off and not making eye contact. Blood sugar level was noted to be elevated to 560s, sodium level elevated to 161. EMS noted her to be nonverbal and brought her to the ED.  In the ED, patient was afebrile, BP in low normal range. Initial labs showed sodium level elevated to 160 serum osmolality elevated to 376, creatinine elevated 1.44, lactic acid elevated 2.7. CT head showed a sequelae of prior infarcts in the bilateral cerebellar hemispheres, bilateral basal ganglia and left parieto-occipital region.  There is a background findings of extensive microvascular angiopathy.  Admitted to hospitalist service for further evaluation management.  Subjective: Patient was seen and examined this morning. Lying on bed.  Tries to open eyes on verbal command but no significant difference in mental status compared to yesterday despite improvement in sodium level.  Assessment/Plan: Acute hypernatremia -Sodium level is gradually improving on dextrose drip, 146 this morning. -Patient is altered and has no oral intake.  So I would continue dextrose drip at 50 mill per hour.  -Continue to monitor BMP. Recent Labs  Lab 07/22/20 2103 07/23/20 0110 07/23/20 0401 07/23/20 0957 07/23/20 1951 07/24/20 0111 07/25/20 0153  NA 160* 162* 160* 158* 153* 153* 146*   Hyperosmolar diabetic nonketotic state Uncontrolled type 2 diabetes mellitus -Initial blood sugar level 560 with  serum osmolality elevated to 376, A1c 9 on 2/23. -Patient has been on dextrose drip since admission. Was also on insulin drip which was stopped yesterday and switched to subcutaneous insulin.   -Fingerstick blood sugar trend noted. Over 200 this morning.  Continue Lantus 10 units.  At sliding-scale insulin every 4 hours.  If patient starts to wake up and eat, will switch to Premeal and sliding scale insulin. -It is unclear at this time if patient was taking any long-acting insulin prior to this admission. Recent Labs  Lab 07/24/20 0937 07/24/20 1124 07/24/20 1756 07/24/20 2147 07/25/20 0811  GLUCAP 149* 150* 178* 202* 902*   Acute metabolic encephalopathy  Dementia at baseline  -Nonverbal at baseline but apparently able to follow commands, feed herself  -Currently she is completely altered. Only able to open eyes on verbal command.   -This is likely secondary to significant elevated sodium level.   -Continue to monitor mental status change.  AKI Lactic acidosis -Unclear baseline creatinine.  Creatinine was elevated 1.44 on arrival, likely prerenal.   -Improved with hydration.  Continue to monitor. -Lactic acid level normalized as well. Recent Labs    07/22/20 2103 07/23/20 0110 07/23/20 0401 07/23/20 0957 07/23/20 1951 07/24/20 0111 07/25/20 0153  BUN 71* 67* 63* 58* 46* 42* 26*  CREATININE 1.44* 1.32* 1.20* 1.07* 0.97 0.92 0.82   Recent Labs  Lab 07/22/20 2103 07/23/20 0110 07/23/20 0957  LATICACIDVEN 2.7* 2.7* 1.6   UTI -Empiric rocephin -UCx pending  History of PE -chronically on Eliquis.  Because of compromised oral intake, currently Eliquis is on hold. -DVT prophylaxis with Heparin [place] for now -Resume eliquis when  able to take POs  Mobility: PT eval when able to participate Code Status:   Code Status: DNR . I discussed patient's CODE STATUS with her son Mr. [name] again.  He has discussed this with his siblings. All agree on DNR at this time. CODE STATUS  updated in the chart. Nutritional status: Body mass index is 35.8 kg/m.     Diet Order            Diet NPO time specified  Diet effective now                 DVT prophylaxis: heparin injection 5,000 Units Start: 07/23/20 0600 SCDs Start: 07/23/20 0028   Antimicrobials:  IV Rocephin Fluid: D5 at 50 mill per hour Consultants: None Family Communication:  I did a conference call with patient's son Mr. [name] and daughter Ms. Cyril Mourning this morning.  Status is: Inpatient  Remains inpatient appropriate because: Needs further IV hydration and monitoring  Dispo: The patient is from: SNF              Anticipated d/c is to: SNF              Anticipated d/c date is: 3 days              Patient currently is not medically stable to d/c.   Difficult to place patient No  Infusions:  . cefTRIAXone (ROCEPHIN)  IV 1 g (07/25/20 0409)  . dextrose 50 mL/hr at 07/25/20 0001    Scheduled Meds: . chlorhexidine  15 mL Mouth Rinse BID  . Chlorhexidine Gluconate Cloth  6 each Topical Q0600  . heparin  5,000 Units Subcutaneous Q8H  . insulin aspart  0-9 Units Subcutaneous Q4H  . insulin glargine  10 Units Subcutaneous Daily  . mouth rinse  15 mL Mouth Rinse q12n4p  . mupirocin ointment  1 application Nasal BID    Antimicrobials: Anti-infectives (From admission, onward)   Start     Dose/Rate Route Frequency Ordered Stop   07/23/20 0215  cefTRIAXone (ROCEPHIN) 1 g in sodium chloride 0.9 % 100 mL IVPB        1 g 200 mL/hr over 30 Minutes Intravenous Every 24 hours 07/23/20 0205        PRN meds: dextrose   Objective: Vitals:   07/25/20 0400 07/25/20 0813  BP: (!) 109/54 (!) 131/47  Pulse: 68 79  Resp: 18 20  Temp: 99.6 F (37.6 C) 98.6 F (37 C)  SpO2: 95% 95%    Intake/Output Summary (Last 24 hours) at 07/25/2020 1115 Last data filed at 07/25/2020 0844 Gross per 24 hour  Intake 1475.13 ml  Output 300 ml  Net 1175.13 ml   [facility] Weights   07/22/20 2107 07/23/20 1645   Weight: 88 kg 80.4 kg   Weight change:  Body mass index is 35.8 kg/m.   Physical Exam: General exam: Pleasant elderly Caucasian female.  Nonverbal, not in physical distress Skin: No rashes, lesions or ulcers. HEENT: Atraumatic, normocephalic, no obvious bleeding Lungs: Clear to auscultation bilaterally CVS: Regular rate and rhythm, no murmur GI/Abd soft, nontender, nondistended, bowel sound present CNS: Tries to open eyes on verbal command.  No essential change in mental status in last 48 hours Psychiatry: Unable to examine because of altered mental status Extremities: No pedal edema, no calf tenderness  Data Review: I have personally reviewed the laboratory data and studies available.  Recent Labs  Lab 07/22/20 2103 07/24/20 0111 07/25/20 0153  WBC 8.5 7.2  5.8  NEUTROABS 5.8 4.3 3.0  HGB 14.0 13.0 13.1  HCT 46.1* 41.6 41.0  MCV 99.4 97.2 94.9  PLT 130* 109* 112*   Recent Labs  Lab 07/23/20 0401 07/23/20 0957 07/23/20 1951 07/24/20 0111 07/25/20 0153  NA 160* 158* 153* 153* 146*  K 3.6 3.8 3.5 3.7 4.0  CL 119* 117* 112* 113* 108  CO2 30 31 30 29 28   GLUCOSE 219* 142* 151* 169* 208*  BUN 63* 58* 46* 42* 26*  CREATININE 1.20* 1.07* 0.97 0.92 0.82  CALCIUM 8.8* 8.6* 8.5* 8.5* 8.2*    F/u labs ordered Unresulted Labs (From admission, onward)          Start     Ordered   07/24/20 2929  Basic metabolic panel  Daily,   R      07/23/20 0833   07/24/20 0500  CBC with Differential/Platelet  Daily,   R      07/23/20 0903          Signed, Terrilee Croak, MD [facility] 07/25/2020

## 2020-07-25 NOTE — Progress Notes (Signed)
Inpatient Diabetes Program Recommendations  AACE/ADA: New Consensus Statement on Inpatient Glycemic Control (2015)  Target Ranges:  Prepandial:   less than 140 mg/dL      Peak postprandial:   less than 180 mg/dL (1-2 hours)      Critically ill patients:  140 - 180 mg/dL   Lab Results  Component Value Date   GLUCAP 211 (H) 07/25/2020   HGBA1C 9.0 (H) 07/23/2020    Review of Glycemic Control Results for KENZINGTON, MIELKE (MRN [id number]) as of 07/25/2020 11:04  Ref. Range 07/24/2020 21:47 07/25/2020 08:11  Glucose-Capillary Latest Ref Range: 70 - 99 mg/dL 202 (H) 211 (H)   Diabetes history: DM2 Outpatient Diabetes medications: Lantus 10 units daily, Novolog 2-10 units TID Current orders for Inpatient glycemic control: Lantus 10 units daily  Inpatient Diabetes Program Recommendations:     Might consider Novolog 0-9 units TID if cbg's > 180 mg/dL.  Will continue to follow while inpatient.  Thank you, Reche Dixon, RN, BSN Diabetes Coordinator Inpatient Diabetes Program [phone] (team pager from 8a-5p)

## 2020-07-26 LAB — CBC WITH DIFFERENTIAL/PLATELET
Abs Immature Granulocytes: 0.07 10*3/uL (ref 0.00–0.07)
Basophils Absolute: 0 10*3/uL (ref 0.0–0.1)
Basophils Relative: 0 %
Eosinophils Absolute: 0 10*3/uL (ref 0.0–0.5)
Eosinophils Relative: 0 %
HCT: 40.9 % (ref 36.0–46.0)
Hemoglobin: 13.4 g/dL (ref 12.0–15.0)
Immature Granulocytes: 1 %
Lymphocytes Relative: 22 %
Lymphs Abs: 1.6 10*3/uL (ref 0.7–4.0)
MCH: 30.5 pg (ref 26.0–34.0)
MCHC: 32.8 g/dL (ref 30.0–36.0)
MCV: 93 fL (ref 80.0–100.0)
Monocytes Absolute: 0.6 10*3/uL (ref 0.1–1.0)
Monocytes Relative: 9 %
Neutro Abs: 4.8 10*3/uL (ref 1.7–7.7)
Neutrophils Relative %: 68 %
Platelets: 107 10*3/uL — ABNORMAL LOW (ref 150–400)
RBC: 4.4 MIL/uL (ref 3.87–5.11)
RDW: 16.8 % — ABNORMAL HIGH (ref 11.5–15.5)
WBC: 7.1 10*3/uL (ref 4.0–10.5)
nRBC: 0.3 % — ABNORMAL HIGH (ref 0.0–0.2)

## 2020-07-26 LAB — BASIC METABOLIC PANEL
Anion gap: 8 (ref 5–15)
BUN: 18 mg/dL (ref 8–23)
CO2: 26 mmol/L (ref 22–32)
Calcium: 8.1 mg/dL — ABNORMAL LOW (ref 8.9–10.3)
Chloride: 110 mmol/L (ref 98–111)
Creatinine, Ser: 0.83 mg/dL (ref 0.44–1.00)
GFR, Estimated: >60 mL/min (ref >60)
Glucose, Bld: 155 mg/dL — ABNORMAL HIGH (ref 70–99)
Potassium: 3.2 mmol/L — ABNORMAL LOW (ref 3.5–5.1)
Sodium: 144 mmol/L (ref 135–145)

## 2020-07-26 LAB — GLUCOSE, CAPILLARY
Glucose-Capillary: 150 mg/dL — ABNORMAL HIGH (ref 70–99)
Glucose-Capillary: 152 mg/dL — ABNORMAL HIGH (ref 70–99)
Glucose-Capillary: 165 mg/dL — ABNORMAL HIGH (ref 70–99)
Glucose-Capillary: 195 mg/dL — ABNORMAL HIGH (ref 70–99)
Glucose-Capillary: 197 mg/dL — ABNORMAL HIGH (ref 70–99)

## 2020-07-26 MED ORDER — LORAZEPAM 2 MG/ML IJ SOLN
1.0000 mg | INTRAMUSCULAR | Status: DC | PRN
  Administered 2020-07-27: 1 mg via INTRAVENOUS
  Filled 2020-07-26: qty 1

## 2020-07-26 MED ORDER — ACETAMINOPHEN 325 MG PO TABS
650.0000 mg | ORAL_TABLET | Freq: Four times a day (QID) | ORAL | Status: DC | PRN
  Filled 2020-07-26: qty 2

## 2020-07-26 MED ORDER — ACETAMINOPHEN 650 MG RE SUPP
650.0000 mg | Freq: Four times a day (QID) | RECTAL | Status: DC | PRN
  Administered 2020-07-26: 650 mg via RECTAL
  Filled 2020-07-26: qty 1

## 2020-07-26 MED ORDER — INSULIN ASPART 100 UNIT/ML ~~LOC~~ SOLN: 0.0000 [IU] | Freq: Two times a day (BID) | SUBCUTANEOUS | Status: DC | PRN

## 2020-07-26 MED ORDER — LORAZEPAM 1 MG PO TABS: 1.0000 mg | ORAL_TABLET | ORAL | Status: DC | PRN

## 2020-07-26 MED ORDER — LORAZEPAM 2 MG/ML PO CONC: 1.0000 mg | ORAL | Status: DC | PRN

## 2020-07-26 NOTE — Progress Notes (Signed)
The patient's daughter, Cyril Mourning, called last night for an update. Cyril Mourning provided the password. Cyril Mourning states that she had been visiting the patient while she was at [facility] and that she had to assist her mother (the patient) with eating and drinking fluids. Cyril Mourning shared with me her concerns that the patient had not been getting adequate nutrition or hydration while at [facility]. Cyril Mourning also stated that while visiting the patient at [place] last Sunday ( 07/20/2020) the patient coughed after drinking water. Kristen asked about the patient's lab results - specifically the patient's sodium and glucose levels. I reviewed the lab results with her and she verbalized she understood and had no further questions regarding the lab results. Cyril Mourning also inquired about palliative and hospice care and asked when we (the health care team) were going to make the patient palliative. I explained to Cyril Mourning that the decision to "make" a patient hospice or palliative is determined by the patient or their designee, not by the health care team. She verbalized understanding.

## 2020-07-26 NOTE — Progress Notes (Signed)
PROGRESS NOTE  Doryce Mcgregory Levengood  DOB: [birth year]  PCP: Garwin Brothers, MD KPT:[id number]  DOA: 07/22/2020  LOS: 3 days   Chief Complaint  Patient presents with  . Hyperglycemia   Brief narrative: Carrie Smith is a 85 y.o. female with PMH significant for dementia, stroke, COPD who is a resident at [facility] in [place]. Per history, at baseline patient has left hemiplegia and nonverbal but normally alert, pleasant, responsive and able to feed herself. 2/22, around noon, staff noted her to be unresponsive, listless, staring off and not making eye contact. Blood sugar level was noted to be elevated to 560s, sodium level elevated to 161. EMS noted her to be nonverbal and brought her to the ED.  In the ED, patient was afebrile, BP in low normal range. Initial labs showed sodium level elevated to 160 serum osmolality elevated to 376, creatinine elevated 1.44, lactic acid elevated 2.7. CT head showed a sequelae of prior infarcts in the bilateral cerebellar hemispheres, bilateral basal ganglia and left parieto-occipital region.  There is a background findings of extensive microvascular angiopathy.  Admitted to hospitalist service for further evaluation management. Over the course of last 4 days, patient has improvement in her sodium level and glucose level but mental status has not improved and in fact gotten worse.  She started having fever last night. Family updated every day including this morning.  Family understands the severity of her condition and poor prognosis.  Family asks for hospice consultation.  Subjective: Patient was seen and examined this morning. Patient remains lethargic, barely tries to open eyes on sternal rub this morning.  Assessment/Plan: Acute hypernatremia -Sodium level is gradually improving on dextrose drip, 144 this morning. -Mental status however did not improve as expected.   Recent Labs  Lab 07/22/20 2103 07/23/20 0110 07/23/20 0401 07/23/20 0957  07/23/20 1951 07/24/20 0111 07/25/20 0153 07/26/20 0106  NA 160* 162* 160* 158* 153* 153* 146* 144   Hyperosmolar diabetic nonketotic state Uncontrolled type 2 diabetes mellitus -Initial blood sugar level 560 with serum osmolality elevated to 376, A1c 9 on 2/23. -Patient was treated with dextrose drip and insulin drip.  Blood glucose level in osmolality improved but mental status did not. Currently patient is on Lantus subcu and family wants to continue the same for now. Recent Labs  Lab 07/25/20 1802 07/25/20 2146 07/26/20 0005 07/26/20 0427 07/26/20 0804  GLUCAP 179* 153* 165* 150* 170*   Acute metabolic encephalopathy  Dementia at baseline  -Nonverbal at baseline but apparently able to follow commands, feed herself  -Since admission, patient has remained completely altered, barely tries to open eyes on sternal rub.    AKI Lactic acidosis -Improved with hydration Recent Labs    07/22/20 2103 07/23/20 0110 07/23/20 0401 07/23/20 0957 07/23/20 1951 07/24/20 0111 07/25/20 0153 07/26/20 0106  BUN 71* 67* 63* 58* 46* 42* 26* 18  CREATININE 1.44* 1.32* 1.20* 1.07* 0.97 0.92 0.82 0.83   Recent Labs  Lab 07/22/20 2103 07/23/20 0110 07/23/20 0957  LATICACIDVEN 2.7* 2.7* 1.6   UTI -Empiric rocephin -UCx did not show adequate growth.  Plan was to continue IV Rocephin for now.  History of PE -chronically on Eliquis.  Because of compromised oral intake, currently Eliquis is on hold.  Code Status:   Code Status: DNR .  Progress status  Nutritional status: Body mass index is 35.8 kg/m.     Diet Order            Diet NPO time  specified  Diet effective now                 DVT prophylaxis: SCDs Start: 07/23/20 0028   Antimicrobials:  IV Rocephin Fluid: D5 at 50 mill per hour Consultants: None Family Communication:  I did a conference call with patient's son Mr. [name] and daughter Ms. Cyril Mourning this morning.  Family requested for residential hospice placement.   Order in.  Communicated with care management and RN.  Status is: Inpatient Dispo: The patient is from: SNF              Anticipated d/c is to: Expecting discharge to residential hospice              Anticipated d/c date is: 3 days              Patient currently is not medically stable to d/c.   Difficult to place patient No  Infusions:  . cefTRIAXone (ROCEPHIN)  IV Stopped (07/26/20 0206)  . dextrose 50 mL/hr at 07/26/20 0439    Scheduled Meds: . chlorhexidine  15 mL Mouth Rinse BID  . Chlorhexidine Gluconate Cloth  6 each Topical Q0600  . insulin aspart  0-9 Units Subcutaneous Q4H  . insulin glargine  10 Units Subcutaneous Daily  . mouth rinse  15 mL Mouth Rinse q12n4p  . mupirocin ointment  1 application Nasal BID    Antimicrobials: Anti-infectives (From admission, onward)   Start     Dose/Rate Route Frequency Ordered Stop   07/23/20 0215  cefTRIAXone (ROCEPHIN) 1 g in sodium chloride 0.9 % 100 mL IVPB        1 g 200 mL/hr over 30 Minutes Intravenous Every 24 hours 07/23/20 0205        PRN meds: acetaminophen, acetaminophen, dextrose, LORazepam **OR** LORazepam **OR** LORazepam   Objective: Vitals:   07/26/20 0800 07/26/20 0906  BP: (!) 95/55 (!) 94/44  Pulse: 93 82  Resp: 20 20  Temp: (!) 101.9 F (38.8 C) (!) 101.5 F (38.6 C)  SpO2:      Intake/Output Summary (Last 24 hours) at 07/26/2020 1005 Last data filed at 07/26/2020 0926 Gross per 24 hour  Intake 500.84 ml  Output 500 ml  Net 0.84 ml   [facility] Weights   07/22/20 2107 07/23/20 1645  Weight: 88 kg 80.4 kg   Weight change:  Body mass index is 35.8 kg/m.   Physical Exam: General exam: Pleasant elderly Caucasian female.  Nonverbal, not in physical distress Skin: No rashes, lesions or ulcers. HEENT: Atraumatic, normocephalic, no obvious bleeding Lungs: Clear to auscultation bilaterally CVS: Regular rate and rhythm, no murmur GI/Abd soft, nontender, nondistended, bowel sound present CNS: Tries to  open eyes on verbal command.  No essential change in mental status in last 48 hours Psychiatry: Unable to examine because of altered mental status Extremities: No pedal edema, no calf tenderness  Data Review: I have personally reviewed the laboratory data and studies available.  Recent Labs  Lab 07/22/20 2103 07/24/20 0111 07/25/20 0153 07/26/20 0106  WBC 8.5 7.2 5.8 7.1  NEUTROABS 5.8 4.3 3.0 4.8  HGB 14.0 13.0 13.1 13.4  HCT 46.1* 41.6 41.0 40.9  MCV 99.4 97.2 94.9 93.0  PLT 130* 109* 112* 107*   Recent Labs  Lab 07/23/20 0957 07/23/20 1951 07/24/20 0111 07/25/20 0153 07/26/20 0106  NA 158* 153* 153* 146* 144  K 3.8 3.5 3.7 4.0 3.2*  CL 117* 112* 113* 108 110  CO2 31 30 29 28  26  GLUCOSE 142* 151* 169* 208* 155*  BUN 58* 46* 42* 26* 18  CREATININE 1.07* 0.97 0.92 0.82 0.83  CALCIUM 8.6* 8.5* 8.5* 8.2* 8.1*    F/u labs ordered Unresulted Labs (From admission, onward)         None      Signed, Terrilee Croak, MD [facility] 07/26/2020

## 2020-07-26 NOTE — Progress Notes (Signed)
Sent message to Dr. Pietro Cassis:  Kermit Balo Morning! The patient is febrile this morning. She is currently at 101.9 at 4:45 this morning she has 100.5. May we get an order of tylenol?

## 2020-07-26 NOTE — TOC Progression Note (Signed)
Transition of Care [facility]) - Progression Note    Patient Details  Name: Carrie Smith MRN: [id number] Date of Birth: [birth year]  Transition of Care [facility]) CM/SW Contact  [address] [place], [place] Phone Number: 07/26/2020, 11:06 AM  Clinical Narrative:    This social worker consulted to assist with a residential hospice referral. Spoke with patient's son a bedside. Patient's son resides in [place] and agrees with referral to [facility] Venia Carbon, Hospital Liaison for [facility] contacted, referral made. She will review patient's chart and follow up this Education officer, museum with availability. TOC will continue to follow.   [address], LCSW Transition of Care [phone]    Expected Discharge Plan: [place] Barriers to Discharge: Continued Medical Work up  Expected Discharge Plan and Services Expected Discharge Plan: [place] In-house Referral: Clinical Social Work   Post Acute Care Choice: [place] Living arrangements for the past 2 months: [place]                                       Social Determinants of Health (SDOH) Interventions    Readmission Risk Interventions No flowsheet data found.

## 2020-07-26 NOTE — Progress Notes (Addendum)
[facility])  Referral received for residential hospice. Met with family at the bedside, provided support and answered questions.    Family with questions about enteral nutrition and IVF, which we typically do not provide at [facility].    This Probation officer needs to have our attending verify if she is eligible for residential hospice at [facility]. Once this has been determined, [facility] will update TOC and family.  Venia Carbon RN, BSN, [place] Hospital Liaison

## 2020-07-26 NOTE — Plan of Care (Signed)
  Problem: Safety: Goal: Ability to remain free from injury will improve Outcome: Progressing   

## 2020-07-27 LAB — GLUCOSE, CAPILLARY: Glucose-Capillary: 126 mg/dL — ABNORMAL HIGH (ref 70–99)

## 2020-07-27 MED ORDER — LORAZEPAM 1 MG PO TABS: 1.0000 mg | ORAL_TABLET | ORAL | 0 refills | Status: AC | PRN

## 2020-07-27 NOTE — TOC Transition Note (Signed)
Transition of Care [facility]) - CM/SW Discharge Note   Patient Details  Name: Carrie Smith MRN: [id number] Date of Birth: 04-21-1936  Transition of Care [facility]) CM/SW Contact:  Elliot Gurney [place], [place] Phone Number: 07/27/2020, 11:51 AM   Clinical Narrative:     Patient to discharge today to [facility]. [facility] contacted for transport. Discharge Summary faxed to [phone].   [place], LCSW Transition of Care [phone]     Barriers to Discharge: Continued Medical Work up   Patient Goals and CMS Choice Patient states their goals for this hospitalization and ongoing recovery are:: Return to SNF CMS Medicare.gov Compare Post Acute Care list provided to:: Patient Represenative (must comment) Choice offered to / list presented to : Adult Children  Discharge Placement                       Discharge Plan and Services In-house Referral: Clinical Social Work   Post Acute Care Choice: [place]                               Social Determinants of Health (SDOH) Interventions     Readmission Risk Interventions No flowsheet data found.

## 2020-07-27 NOTE — Plan of Care (Signed)

## 2020-07-27 NOTE — Progress Notes (Signed)
Patient discharged to [facility].  Report called to Clarene Critchley, Therapist, sports.  Discharge instructions sent with [place] staff.

## 2020-07-27 NOTE — Progress Notes (Signed)
Manufacturing engineer [facility])  [place] has a bed to offer today.   Necessary consents have to be completed first. Once this is completed, this Probation officer will update [facility] so transport can be arranged.  RN staff, please call report at any time to [phone].  Once d/c summary is completed, please fax to [phone].  IVF must be stopped prior to transporting. You may leave her IV site accessed. Family is aware that IVF, abx and any treatment not in alignment with full comfort will not be provided at [facility].  Updated attending, TOC and bedside RN via epic chat of above.   Thank you, Venia Carbon RN, BSN, [place] Hospital Liaison

## 2020-07-27 NOTE — Discharge Summary (Signed)
Physician Discharge Summary  Carrie Smith ZWC:[id number] DOB: 12-Feb-1936 DOA: 07/22/2020  PCP: Garwin Brothers, MD  Admit date: 07/22/2020 Discharge date: 07/27/2020  Admitted From: SNF Discharge disposition: Residential hospice at [facility]   Code Status: DNR  Diet Recommendation: Can be allowed to eat if she wakes up  Discharge Diagnosis:   Principal Problem:   Acute encephalopathy Active Problems:   AKI (acute kidney injury) ([place])   Hypernatremia   Acute lower UTI   HHNC (hyperglycemic hyperosmolar nonketotic coma) ([place])   Dementia with behavioral disturbance [facility])  Chief Complaint  Patient presents with  . Hyperglycemia   Brief narrative: Carrie Smith is a 85 y.o. female with PMH significant for dementia, stroke, COPD who is a resident at [facility] in [place]. Per history, at baseline patient has left hemiplegia and nonverbal but normally alert, pleasant, responsive and able to feed herself. 2/22, around noon, staff noted her to be unresponsive, listless, staring off and not making eye contact. Blood sugar level was noted to be elevated to 560s, sodium level elevated to 161. EMS noted her to be nonverbal and brought her to the ED.  In the ED, patient was afebrile, BP in low normal range. Initial labs showed sodium level elevated to 160 serum osmolality elevated to 376, creatinine elevated 1.44, lactic acid elevated 2.7. CT head showed a sequelae of prior infarcts in the bilateral cerebellar hemispheres, bilateral basal ganglia and left parieto-occipital region.  There is a background findings of extensive microvascular angiopathy.  Admitted to hospitalist service for further evaluation management. Over the course of last 4 days, patient has improvement in her sodium level and glucose level but mental status has not improved and in fact gotten worse.  She started having fever last night. Family updated every day including this morning.  Family understands the  severity of her condition and poor prognosis.  Family asks for hospice consultation.  Comfort care measures started. Accepted at [facility].  Subjective: Patient was seen and examined this morning. Able to open eyes on verbal command.  Unable to follow any other commands or have a conversation.  Hospital course: Acute hypernatremia -Sodium level is improved with IV hydration Recent Labs  Lab 07/22/20 2103 07/23/20 0110 07/23/20 0401 07/23/20 0957 07/23/20 1951 07/24/20 0111 07/25/20 0153 07/26/20 0106  NA 160* 162* 160* 158* 153* 153* 146* 144   Hyperosmolar diabetic nonketotic state Uncontrolled type 2 diabetes mellitus -Initial blood sugar level 560 with serum osmolality elevated to 376, A1c 9 on 2/23. -Patient was treated with dextrose drip and insulin drip.  Blood glucose level and osmolality improved but mental status did not show much improvement.  Currently on low-dose subcutaneous insulin -Insulin stopped at discharge to hospice after conversation with family Recent Labs  Lab 07/26/20 0427 07/26/20 0804 07/26/20 1242 07/26/20 2211 07/27/20 0823  GLUCAP 150* 152* 197* 195* 235*   Acute metabolic encephalopathy  Dementia at baseline  -Nonverbal at baseline but apparently was able to follow commands, feed herself  -Since admission, patient has remained completely altered, barely able to open eyes  AKI Lactic acidosis -Improved with hydration Recent Labs    07/22/20 2103 07/23/20 0110 07/23/20 0401 07/23/20 0957 07/23/20 1951 07/24/20 0111 07/25/20 0153 07/26/20 0106  BUN 71* 67* 63* 58* 46* 42* 26* 18  CREATININE 1.44* 1.32* 1.20* 1.07* 0.97 0.92 0.82 0.83   Recent Labs  Lab 07/22/20 2103 07/23/20 0110 07/23/20 0957  LATICACIDVEN 2.7* 2.7* 1.6   UTI -Empiric rocephin -  UCx did not show adequate growth.  Completed 3 days of IV Rocephin.    History of PE -chronically on Eliquis.  Because of compromised oral intake, currently Eliquis is on hold.   Can resume at hospice if able to tolerate oral intake.   Wound care: Pressure Ulcer 04/13/15 Stage II -  Partial thickness loss of dermis presenting as a shallow open ulcer with a red, pink wound bed without slough. broken skin with pink wound bed (Active)  Date First Assessed/Time First Assessed: 04/13/15 2300   Location: Buttocks  Location Orientation: Right  Staging: Stage II -  Partial thickness loss of dermis presenting as a shallow open ulcer with a red, pink wound bed without slough.  Wound Descri...    Assessments 04/13/2015 10:30 PM 04/18/2015 10:34 AM  Dressing Type Foam Foam  Dressing Clean;Dry;Intact Clean;Dry;Intact  Dressing Change Frequency PRN PRN  State of Healing Early/partial granulation --  Site / Wound Assessment Red Dressing in place / Unable to assess  % Wound base Red or Granulating 100% --  Peri-wound Assessment Intact;Erythema (non-blanchable) --  Drainage Amount None --  Treatment Cleansed --     No Linked orders to display    Discharge Exam:   Vitals:   07/26/20 0906 07/26/20 1006 07/26/20 1300 07/26/20 2121  BP: (!) 94/44 107/79 (!) 107/44 (!) 107/56  Pulse: 82 83 75 70  Resp: 20 (!) 31 (!) 30 (!) 25  Temp: (!) 101.5 F (38.6 C) 100 F (37.8 C) 99.7 F (37.6 C) 99.5 F (37.5 C)  TempSrc: Oral Axillary Axillary Axillary  SpO2:  96%  95%  Weight:      Height:        Body mass index is 35.8 kg/m.  General exam: Elderly Caucasian female.  Not in distress Skin: No rashes, lesions or ulcers. HEENT: Atraumatic, normocephalic, no obvious bleeding Lungs: Clear to auscultation bilaterally CVS: Regular rate and rhythm, no murmur GI/Abd soft, nontender, nondistended, bowel sound present CNS: Opens eyes on verbal command.  Unable to follow any other command Psychiatry: Depressed look Extremities: No pedal edema, no calf tenderness  Follow ups:   Discharge Instructions    Increase activity slowly   Complete by: As directed       Follow-up  Information    Garwin Brothers, MD Follow up.   Specialty: Internal Medicine Contact information: [address] [place] [address] [phone]               Recommendations for Outpatient Follow-Up:   1. Follow-up with PCP as an outpatient  Discharge Instructions:   Per hospice policy   Allergies as of 07/27/2020      Reactions   Ampicillin    Codeine    Sulbactam       Medication List    STOP taking these medications   LANTUS SOLOSTAR [place]   potassium chloride 20 MEQ packet Commonly known as: KLOR-CON   torsemide 20 MG tablet Commonly known as: DEMADEX     TAKE these medications   carvedilol 3.125 MG tablet Commonly known as: COREG Take 3.125 mg by mouth 2 (two) times daily. Hold if  SBP <110 or HR  <60   cefTRIAXone 1 g injection Commonly known as: ROCEPHIN Inject 1 g into the muscle once.   Eliquis 5 MG Tabs tablet Generic drug: apixaban Take 2.5 mg by mouth 2 (two) times daily.   guaifenesin 100 MG/5ML syrup Commonly known as: ROBITUSSIN Take 400 mg by mouth  every 12 (twelve) hours as needed for cough.   insulin aspart 100 UNIT/ML injection Commonly known as: novoLOG Inject 2-10 Units into the skin See admin instructions. Per sliding scale twice daily- 151-200= 2 units, 201-250= 4 units, 251-300 = 6 units, 301-350= 8 units, 351-400= 10 units,  over 400 call md   NovoLOG FlexPen 100 UNIT/ML FlexPen Generic drug: insulin aspart Inject 14 Units into the skin once.   ipratropium-albuterol 0.5-2.5 (3) MG/3ML Soln Commonly known as: DUONEB Take 3 mLs by nebulization every 2 (two) hours as needed (wheezing/shortness of breath).   LORazepam 1 MG tablet Commonly known as: ATIVAN Take 1 tablet (1 mg total) by mouth every 4 (four) hours as needed for anxiety.   ondansetron 4 MG disintegrating tablet Commonly known as: ZOFRAN-ODT Take 4 mg by mouth every 12 (twelve) hours as needed for nausea or vomiting.   OVER THE COUNTER MEDICATION Take 120  mLs by mouth daily. House 2.0- for weight loss   OVER THE COUNTER MEDICATION Inject 60 mLs into the vein See admin instructions. NS  60 ml/hr x 1000 ml bag every shift for elevated BG x 1000 ml then d/c   OXYGEN Inhale 2 L into the lungs in the morning, at noon, and at bedtime. To keep sats >90%   traMADol 50 MG tablet Commonly known as: ULTRAM Take by mouth every 12 (twelve) hours as needed for moderate pain.       Time coordinating discharge: 35 minutes  The results of significant diagnostics from this hospitalization (including imaging, microbiology, ancillary and laboratory) are listed below for reference.    Procedures and Diagnostic Studies:   CT Head Wo Contrast  Result Date: 07/22/2020 CLINICAL DATA:  Found unresponsive at noon EXAM: CT HEAD WITHOUT CONTRAST TECHNIQUE: Contiguous axial images were obtained from the base of the skull through the vertex without intravenous contrast. COMPARISON:  CT 08/21/2017 FINDINGS: Brain: Sequela of prior infarcts seen in the bilateral cerebellar hemispheres, bilateral basal ganglia and left parieto-occipital region. Fall there has been some interval progression from comparison studies, many of these have a fairly remote or age indeterminate appearance. Findings on a background of extensive microvascular angiopathy and marked parenchymal volume loss. Dilatation of the ventricles is somewhat out of proportion to the sulcal prominence and could reflect a degree of normal pressure hydrocephaly. Correlate with clinical findings. No extra-axial collection, mass or mass effect. Vascular: Atherosclerotic calcification of the carotid siphons and intradural vertebral arteries. No hyperdense vessel. Skull: No calvarial fracture or suspicious osseous lesion. No scalp swelling or hematoma. Sinuses/Orbits: Mural thickening in the ethmoids and left sphenoid sinus. Partial opacification of the right mastoid air cells. Included orbital structures are unremarkable.  Other: Edentulous. IMPRESSION: 1. Sequela of prior infarcts in the bilateral cerebellar hemispheres, bilateral basal ganglia and left parieto-occipital region. There has been some interval progression from comparison studies, though many of these have a fairly remote or age indeterminate appearance. If there is persisting concern for acute infarction, MRI is more sensitive and specific for early features of ischemia. 2. Findings on a background of extensive microvascular angiopathy and marked parenchymal volume loss. 3. Dilatation of the ventricles is somewhat out of proportion to the sulcal prominence and could reflect a degree of normal pressure hydrocephaly. Correlate with clinical findings. 4. Intracranial atherosclerosis. Electronically Signed   By: Lovena Le M.D.   On: 07/22/2020 22:08   DG CHEST PORT 1 VIEW  Result Date: 07/23/2020 CLINICAL DATA:  Altered mental status, hypoglycemia, dyspnea EXAM:  PORTABLE CHEST 1 VIEW COMPARISON:  07/22/2020 FINDINGS: Persistent elevation of the right hemidiaphragm. Mild patchy density at the lung bases. No significant pleural effusion. No pneumothorax. Stable cardiomediastinal contours. IMPRESSION: Persistent mild patchy density at the lung bases, which may reflect atelectasis or scarring. Electronically Signed   By: Macy Mis M.D.   On: 07/23/2020 12:07   DG Chest Portable 1 View  Result Date: 07/22/2020 CLINICAL DATA:  Altered mental status EXAM: PORTABLE CHEST 1 VIEW COMPARISON:  CT 08/10/2019, radiograph 06/18/2018 FINDINGS: Diminished lung volumes from comparison portable with some likely atelectatic changes. Increasing hazy reticular opacities in the lungs, could be atelectatic or reflect some edematous change. More bandlike opacity in the right lung base with elevation of the right hemidiaphragm suggestive of some volume loss. More focal opacity in the left lung base/retrocardiac space with obscuration of the left hemidiaphragm, could reflect further  volume loss, airspace disease or layering effusion. Grossly stable cardiomediastinal contours with a calcified aorta. The osseous structures appear diffusely demineralized which may limit detection of small or nondisplaced fractures. No acute osseous abnormality or suspicious osseous lesion. Degenerative changes are present in the imaged spine and shoulders. Telemetry leads overlie the chest. Edematous changes of the body wall. IMPRESSION: 1. Increasing hazy reticular opacities in the lungs, could be atelectatic or edematous change. 2. More focal opacity in the left lung base/retrocardiac space with obscuration of the left hemidiaphragm, could reflect further volume loss, airspace disease, or layering effusion. 3.  Aortic Atherosclerosis (ICD10-I70.0). Electronically Signed   By: Lovena Le M.D.   On: 07/22/2020 22:10     Labs:   Basic Metabolic Panel: Recent Labs  Lab 07/23/20 0957 07/23/20 1951 07/24/20 0111 07/25/20 0153 07/26/20 0106  NA 158* 153* 153* 146* 144  K 3.8 3.5 3.7 4.0 3.2*  CL 117* 112* 113* 108 110  CO2 31 30 29 28 26   GLUCOSE 142* 151* 169* 208* 155*  BUN 58* 46* 42* 26* 18  CREATININE 1.07* 0.97 0.92 0.82 0.83  CALCIUM 8.6* 8.5* 8.5* 8.2* 8.1*   GFR Estimated Creatinine Clearance: 47.9 mL/min (by C-G formula based on SCr of 0.83 mg/dL). Liver Function Tests: Recent Labs  Lab 07/22/20 2103  AST 21  ALT 21  ALKPHOS 89  BILITOT 0.5  PROT 6.0*  ALBUMIN 2.5*   No results for input(s): LIPASE, AMYLASE in the last 168 hours. No results for input(s): AMMONIA in the last 168 hours. Coagulation profile No results for input(s): INR, PROTIME in the last 168 hours.  CBC: Recent Labs  Lab 07/22/20 2103 07/24/20 0111 07/25/20 0153 07/26/20 0106  WBC 8.5 7.2 5.8 7.1  NEUTROABS 5.8 4.3 3.0 4.8  HGB 14.0 13.0 13.1 13.4  HCT 46.1* 41.6 41.0 40.9  MCV 99.4 97.2 94.9 93.0  PLT 130* 109* 112* 107*   Cardiac Enzymes: No results for input(s): CKTOTAL, CKMB,  CKMBINDEX, TROPONINI in the last 168 hours. BNP: Invalid input(s): POCBNP CBG: Recent Labs  Lab 07/26/20 0427 07/26/20 0804 07/26/20 1242 07/26/20 2211 07/27/20 0823  GLUCAP 150* 152* 197* 195* 126*   D-Dimer No results for input(s): DDIMER in the last 72 hours. Hgb A1c No results for input(s): HGBA1C in the last 72 hours. Lipid Profile No results for input(s): CHOL, HDL, LDLCALC, TRIG, CHOLHDL, LDLDIRECT in the last 72 hours. Thyroid function studies No results for input(s): TSH, T4TOTAL, T3FREE, THYROIDAB in the last 72 hours.  Invalid input(s): FREET3 Anemia work up No results for input(s): VITAMINB12, FOLATE, FERRITIN, TIBC, IRON, RETICCTPCT  in the last 72 hours. Microbiology Recent Results (from the past 240 hour(s))  Resp Panel by RT-PCR (Flu A&B, Covid) Nasopharyngeal Swab     Status: None   Collection Time: 07/22/20  9:21 PM   Specimen: Nasopharyngeal Swab; Nasopharyngeal(NP) swabs in vial transport medium  Result Value Ref Range Status   SARS Coronavirus 2 by RT PCR NEGATIVE NEGATIVE Final    Comment: (NOTE) SARS-CoV-2 target nucleic acids are NOT DETECTED.  The SARS-CoV-2 RNA is generally detectable in upper respiratory specimens during the acute phase of infection. The lowest concentration of SARS-CoV-2 viral copies this assay can detect is 138 copies/mL. A negative result does not preclude SARS-Cov-2 infection and should not be used as the sole basis for treatment or other patient management decisions. A negative result may occur with  improper specimen collection/handling, submission of specimen other than nasopharyngeal swab, presence of viral mutation(s) within the areas targeted by this assay, and inadequate number of viral copies(<138 copies/mL). A negative result must be combined with clinical observations, patient history, and epidemiological information. The expected result is Negative.  Fact Sheet for Patients:   EntrepreneurPulse.com.au  Fact Sheet for Healthcare Providers:  IncredibleEmployment.be  This test is no t yet approved or cleared by the [place] FDA and  has been authorized for detection and/or diagnosis of SARS-CoV-2 by FDA under an Emergency Use Authorization (EUA). This EUA will remain  in effect (meaning this test can be used) for the duration of the COVID-19 declaration under Section 564(b)(1) of the Act, 21 U.S.C.section 360bbb-3(b)(1), unless the authorization is terminated  or revoked sooner.       Influenza A by PCR NEGATIVE NEGATIVE Final   Influenza B by PCR NEGATIVE NEGATIVE Final    Comment: (NOTE) The Xpert Xpress SARS-CoV-2/FLU/RSV plus assay is intended as an aid in the diagnosis of influenza from Nasopharyngeal swab specimens and should not be used as a sole basis for treatment. Nasal washings and aspirates are unacceptable for Xpert Xpress SARS-CoV-2/FLU/RSV testing.  Fact Sheet for Patients: EntrepreneurPulse.com.au  Fact Sheet for Healthcare Providers: IncredibleEmployment.be  This test is not yet approved or cleared by the [place] FDA and has been authorized for detection and/or diagnosis of SARS-CoV-2 by FDA under an Emergency Use Authorization (EUA). This EUA will remain in effect (meaning this test can be used) for the duration of the COVID-19 declaration under Section 564(b)(1) of the Act, 21 U.S.C. section 360bbb-3(b)(1), unless the authorization is terminated or revoked.  Performed at [facility], [place] [address], [place], [place] [address]   Culture, Urine     Status: Abnormal   Collection Time: 07/23/20 12:10 AM   Specimen: Urine, Random  Result Value Ref Range Status   Specimen Description URINE, RANDOM  Final   Special Requests NONE  Final   Culture (A)  Final    <10,000 COLONIES/mL INSIGNIFICANT GROWTH Performed at [facility], [place] [address] [place], [place] [address]    Report Status 07/24/2020 FINAL  Final  MRSA PCR Screening     Status: Abnormal   Collection Time: 07/23/20  8:07 PM   Specimen: Nasal Mucosa; Nasopharyngeal  Result Value Ref Range Status   MRSA by PCR POSITIVE (A) NEGATIVE Final    Comment:        The GeneXpert MRSA Assay (FDA approved for NASAL specimens only), is one component of a comprehensive MRSA colonization surveillance program. It is not intended to diagnose MRSA infection nor to guide or monitor  treatment for MRSA infections. RESULT CALLED TO, READ BACK BY AND VERIFIED WITH: Amada RN 07/24/20 0037 JDW Performed at [facility], [place] [address] [place], [place] [address]      Signed: Terrilee Croak  [facility] 07/27/2020, 10:42 AM

## 2020-10-03 NOTE — Progress Notes (Signed)
   07/26/20 1006  Assess: MEWS Score  Temp 100 F (37.8 C)  BP 107/79  Pulse Rate 83  ECG Heart Rate 84  Resp (!) 31  SpO2 96 %  Assess: MEWS Score  MEWS Temp 0  MEWS Systolic 0  MEWS Pulse 0  MEWS RR 2  MEWS LOC 1  MEWS Score 3  MEWS Score Color Yellow  Assess: if the MEWS score is Yellow or Red  Were vital signs taken at a resting state? Yes  Focused Assessment No change from prior assessment  Early Detection of Sepsis Score *See Row Information* Low  MEWS guidelines implemented *See Row Information* Yes  Treat  MEWS Interventions Escalated (See documentation below)  Pain Scale 0-10  Pain Score 0  Take Vital Signs  Increase Vital Sign Frequency  Yellow: Q 2hr X 2 then Q 4hr X 2, if remains yellow, continue Q 4hrs  Escalate  MEWS: Escalate Yellow: discuss with charge nurse/RN and consider discussing with provider and RRT  Notify: Charge Nurse/RN  Name of Charge Nurse/RN Notified Erin,  Date Charge Nurse/RN Notified 07/26/20  Time Charge Nurse/RN Notified 1006  Document  Patient Outcome Other (Comment) (Palliative orders entered)

## 2020-10-03 NOTE — Progress Notes (Signed)
   07/26/20 1006  Assess: MEWS Score  Temp 100 F (37.8 C)  BP 107/79  Pulse Rate 83  ECG Heart Rate 84  Resp (!) 31  SpO2 96 %  Assess: MEWS Score  MEWS Temp 0  MEWS Systolic 0  MEWS Pulse 0  MEWS RR 2  MEWS LOC 1  MEWS Score 3  MEWS Score Color Yellow  Assess: if the MEWS score is Yellow or Red  Were vital signs taken at a resting state? Yes  Focused Assessment No change from prior assessment  Early Detection of Sepsis Score *See Row Information* Low  MEWS guidelines implemented *See Row Information* Yes  Treat  Pain Scale 0-10  Pain Score 0  Notify: Charge Nurse/RN  Name of Charge Nurse/RN Notified Erin,  Date Charge Nurse/RN Notified 07/26/20  Time Charge Nurse/RN Notified 1006  Document  Patient Outcome Other (Comment) (Palliative orders entered)

## 2022-09-29 DEATH — deceased
